# Patient Record
Sex: Female | Born: 1980
Health system: Southern US, Community
[De-identification: ages and names within clinical notes are randomized; demographics above are authoritative.]

## PROBLEM LIST (undated history)

## (undated) DIAGNOSIS — N946 Dysmenorrhea, unspecified: Secondary | ICD-10-CM

## (undated) DIAGNOSIS — I739 Peripheral vascular disease, unspecified: Secondary | ICD-10-CM

## (undated) DIAGNOSIS — I10 Essential (primary) hypertension: Secondary | ICD-10-CM

## (undated) DIAGNOSIS — J45909 Unspecified asthma, uncomplicated: Secondary | ICD-10-CM

## (undated) HISTORY — PX: CHOLECYSTECTOMY: SHX55

## (undated) HISTORY — PX: TUBAL LIGATION: SHX77

---

## 2016-12-28 ENCOUNTER — Emergency Department (HOSPITAL_BASED_OUTPATIENT_CLINIC_OR_DEPARTMENT_OTHER)
Admission: EM | Admit: 2016-12-28 | Discharge: 2016-12-28 | Disposition: A | Discharge status: Home / Self Care | Payer: Medicaid Other | Attending: Dermatology | Admitting: Dermatology

## 2016-12-28 ENCOUNTER — Encounter (HOSPITAL_BASED_OUTPATIENT_CLINIC_OR_DEPARTMENT_OTHER): Payer: Self-pay | Admitting: *Deleted

## 2016-12-28 DIAGNOSIS — Z5321 Procedure and treatment not carried out due to patient leaving prior to being seen by health care provider: Secondary | ICD-10-CM | POA: Diagnosis not present

## 2016-12-28 DIAGNOSIS — F17290 Nicotine dependence, other tobacco product, uncomplicated: Secondary | ICD-10-CM | POA: Insufficient documentation

## 2016-12-28 DIAGNOSIS — R102 Pelvic and perineal pain: Secondary | ICD-10-CM | POA: Insufficient documentation

## 2016-12-28 NOTE — ED Notes (Signed)
Called once in triage with no answer.

## 2016-12-28 NOTE — ED Notes (Signed)
Called for reevaluation, no response 

## 2016-12-28 NOTE — ED Triage Notes (Signed)
Pt c/o pelvic pain x 3 days

## 2017-08-16 DIAGNOSIS — M722 Plantar fascial fibromatosis: Secondary | ICD-10-CM | POA: Diagnosis not present

## 2018-01-27 ENCOUNTER — Emergency Department (HOSPITAL_BASED_OUTPATIENT_CLINIC_OR_DEPARTMENT_OTHER): Payer: Self-pay

## 2018-01-27 ENCOUNTER — Other Ambulatory Visit: Payer: Self-pay

## 2018-01-27 ENCOUNTER — Emergency Department (HOSPITAL_BASED_OUTPATIENT_CLINIC_OR_DEPARTMENT_OTHER)
Admission: EM | Admit: 2018-01-27 | Discharge: 2018-01-27 | Disposition: A | Discharge status: Home / Self Care | Payer: Self-pay | Attending: Emergency Medicine | Admitting: Emergency Medicine

## 2018-01-27 ENCOUNTER — Encounter (HOSPITAL_BASED_OUTPATIENT_CLINIC_OR_DEPARTMENT_OTHER): Payer: Self-pay | Admitting: Emergency Medicine

## 2018-01-27 DIAGNOSIS — I10 Essential (primary) hypertension: Secondary | ICD-10-CM | POA: Insufficient documentation

## 2018-01-27 DIAGNOSIS — M778 Other enthesopathies, not elsewhere classified: Secondary | ICD-10-CM

## 2018-01-27 DIAGNOSIS — Y939 Activity, unspecified: Secondary | ICD-10-CM | POA: Insufficient documentation

## 2018-01-27 DIAGNOSIS — M70842 Other soft tissue disorders related to use, overuse and pressure, left hand: Secondary | ICD-10-CM | POA: Insufficient documentation

## 2018-01-27 DIAGNOSIS — F17290 Nicotine dependence, other tobacco product, uncomplicated: Secondary | ICD-10-CM | POA: Insufficient documentation

## 2018-01-27 HISTORY — DX: Essential (primary) hypertension: I10

## 2018-01-27 MED ORDER — AMLODIPINE BESYLATE 5 MG PO TABS
5.0000 mg | ORAL_TABLET | Freq: Once | ORAL | Status: AC
  Administered 2018-01-27: 5 mg via ORAL
  Filled 2018-01-27: qty 1

## 2018-01-27 MED ORDER — IBUPROFEN 800 MG PO TABS
800.0000 mg | ORAL_TABLET | Freq: Once | ORAL | Status: AC
  Administered 2018-01-27: 800 mg via ORAL
  Filled 2018-01-27: qty 1

## 2018-01-27 MED ORDER — AMLODIPINE BESYLATE 5 MG PO TABS: 5.0000 mg | ORAL_TABLET | Freq: Every day | ORAL | 1 refills | Status: DC

## 2018-01-27 NOTE — ED Triage Notes (Signed)
Pt c/o left wrist pain x 2 days without injury. Pt has noted knot on left wrist.

## 2018-01-27 NOTE — Discharge Instructions (Signed)
You may alternate Tylenol 1000 mg every 6 hours as needed for pain and Ibuprofen 800 mg every 8 hours as needed for pain.  Please take Ibuprofen with food.  Both of these medications are found over-the-counter. ° ° °To find a primary care or specialty doctor please call [phone] or [phone] to access "[facility] Find a Doctor Service." ° °You may also go on the [place] [facility] website at [email or web address] ° °There are also multiple [facility], [facility], [facility] and [facility] practices throughout the [place] that are frequently accepting new patients. You may find a clinic that is close to your home and contact them. ° °[facility] -  °[address] °[place] [address] °[phone] ° ° °[facility] -  °[address] °[place] [address] [phone] ° ° °[facility] - °[address]  °[place] [address] °[phone] ° ° ° °

## 2018-01-27 NOTE — ED Triage Notes (Signed)
Pt is hypertensive upon triage. Pt is supposed to be on BP medication but has ran out and does not have PMD to follow up with to prescribe more. Pt educated on dangers of non-compliance with BP medication.

## 2018-01-27 NOTE — ED Provider Notes (Signed)
TIME SEEN: 3:12 AM  CHIEF COMPLAINT: Left wrist pain  HPI: Patient is a 37 year old female who is right-hand dominant with history of hypertension who presents to the emergency department with left wrist pain for the past year.  No injury to the wrist that she can recall.  Pain is worse with flexion and extension.  Has not tried any medications prior to arrival.  No redness, warmth, swelling or ecchymosis.  Also noted to be very hypertensive here.  States she was previously on blood pressure medication but she cannot recall the name.  Has been off for over a year.  Does not have a primary care doctor.  No headaches, vision changes, chest pain, shortness of breath, numbness, tingling or focal weakness currently.  ROS: See HPI Constitutional: no fever  Eyes: no drainage  ENT: no runny nose   Cardiovascular:  no chest pain  Resp: no SOB  GI: no vomiting GU: no dysuria Integumentary: no rash  Allergy: no hives  Musculoskeletal: no leg swelling  Neurological: no slurred speech ROS otherwise negative  PAST MEDICAL HISTORY/PAST SURGICAL HISTORY:  Past Medical History:  Diagnosis Date  . Hypertension     MEDICATIONS:  Prior to Admission medications   Not on File    ALLERGIES:  No Known Allergies  SOCIAL HISTORY:  Social History   Tobacco Use  . Smoking status: Current Every Day Smoker    Types: Cigars  Substance Use Topics  . Alcohol use: No    FAMILY HISTORY: No family history on file.  EXAM: BP (!) 197/125 (BP Location: Right Arm)   Pulse 97   Temp 98.8 F (37.1 C) (Oral)   Resp 16   Ht 5' (1.524 m)   Wt 81.6 kg (180 lb)   LMP  (LMP Unknown)   SpO2 100%   BMI 35.15 kg/m  CONSTITUTIONAL: Alert and oriented and responds appropriately to questions. Well-appearing; well-nourished HEAD: Normocephalic EYES: Conjunctivae clear, pupils appear equal, EOMI ENT: normal nose; moist mucous membranes NECK: Supple, no meningismus, no nuchal rigidity, no LAD  CARD: RRR; S1  and S2 appreciated; no murmurs, no clicks, no rubs, no gallops RESP: Normal chest excursion without splinting or tachypnea; breath sounds clear and equal bilaterally; no wheezes, no rhonchi, no rales, no hypoxia or respiratory distress, speaking full sentences ABD/GI: Normal bowel sounds; non-distended; soft, non-tender, no rebound, no guarding, no peritoneal signs, no hepatosplenomegaly BACK:  The back appears normal and is non-tender to palpation, there is no CVA tenderness EXT: Tender to palpation over the dorsal left wrist with decreased flexion and extension secondary to pain and poor compliance with examination.  She has 2+ radial pulses bilaterally.  No redness, warmth, ecchymosis or swelling noted.  She has a small nodule over the dorsal left wrist which could be a small ganglion cyst but no fluctuance or induration.  Otherwise normal ROM in all joints; otherwise extremities are non-tender to palpation; no edema; normal capillary refill; no cyanosis, no calf tenderness or swelling    SKIN: Normal color for age and race; warm; no rash NEURO: Moves all extremities equally normal sensation diffusely, cranial nerves II through XII intact, normal speech, normal gait PSYCH: The patient's mood and manner are appropriate. Grooming and personal hygiene are appropriate.  MEDICAL DECISION MAKING: Patient here with what seems to be left wrist tendinitis.  Have recommended alternating Tylenol and Motrin for pain, rest, elevation and ice.  We will give her a Velcro wrist splint for comfort.  No sign of  compartment syndrome, gout, septic arthritis.  X-ray obtained in triage is unremarkable.  Patient also hypertensive.  She is asymptomatic.  I will start her on amlodipine daily and give her outpatient PCP follow-up.  At this time I do not feel she needs further emergent workup for hypertension given she is asymptomatic.  We discussed at length return precautions and importance of compliance with blood pressure  medication and outpatient follow-up.  At this time, I do not feel there is any life-threatening condition present. I have reviewed and discussed all results (EKG, imaging, lab, urine as appropriate) and exam findings with patient/family. I have reviewed nursing notes and appropriate previous records.  I feel the patient is safe to be discharged home without further emergent workup and can continue workup as an outpatient as needed. Discussed usual and customary return precautions. Patient/family verbalize understanding and are comfortable with this plan.  Outpatient follow-up has been provided if needed. All questions have been answered.      Verlinda Slotnick, Layla MawKristen N, DO 01/27/18 [phone]

## 2018-06-10 DIAGNOSIS — M79672 Pain in left foot: Secondary | ICD-10-CM | POA: Insufficient documentation

## 2018-06-10 DIAGNOSIS — M25579 Pain in unspecified ankle and joints of unspecified foot: Secondary | ICD-10-CM

## 2018-06-10 DIAGNOSIS — M79673 Pain in unspecified foot: Secondary | ICD-10-CM | POA: Insufficient documentation

## 2018-06-10 DIAGNOSIS — M25572 Pain in left ankle and joints of left foot: Secondary | ICD-10-CM | POA: Insufficient documentation

## 2018-06-10 HISTORY — DX: Pain in unspecified ankle and joints of unspecified foot: M25.579

## 2018-06-10 HISTORY — DX: Pain in unspecified foot: M79.673

## 2018-08-03 DIAGNOSIS — I10 Essential (primary) hypertension: Secondary | ICD-10-CM | POA: Diagnosis not present

## 2018-08-03 DIAGNOSIS — Z79899 Other long term (current) drug therapy: Secondary | ICD-10-CM | POA: Diagnosis not present

## 2018-08-03 DIAGNOSIS — Z0130 Encounter for examination of blood pressure without abnormal findings: Secondary | ICD-10-CM | POA: Diagnosis not present

## 2018-08-16 DIAGNOSIS — I10 Essential (primary) hypertension: Secondary | ICD-10-CM | POA: Diagnosis not present

## 2018-09-06 DIAGNOSIS — H524 Presbyopia: Secondary | ICD-10-CM | POA: Diagnosis not present

## 2018-09-07 DIAGNOSIS — H5213 Myopia, bilateral: Secondary | ICD-10-CM | POA: Diagnosis not present

## 2018-09-19 DIAGNOSIS — H524 Presbyopia: Secondary | ICD-10-CM | POA: Diagnosis not present

## 2018-12-12 DIAGNOSIS — R079 Chest pain, unspecified: Secondary | ICD-10-CM | POA: Diagnosis not present

## 2018-12-12 DIAGNOSIS — M7989 Other specified soft tissue disorders: Secondary | ICD-10-CM | POA: Diagnosis not present

## 2018-12-12 DIAGNOSIS — M25571 Pain in right ankle and joints of right foot: Secondary | ICD-10-CM | POA: Diagnosis not present

## 2018-12-12 DIAGNOSIS — Z87891 Personal history of nicotine dependence: Secondary | ICD-10-CM | POA: Diagnosis not present

## 2018-12-12 DIAGNOSIS — R05 Cough: Secondary | ICD-10-CM | POA: Diagnosis not present

## 2018-12-12 DIAGNOSIS — R0789 Other chest pain: Secondary | ICD-10-CM | POA: Diagnosis not present

## 2018-12-12 DIAGNOSIS — Z3202 Encounter for pregnancy test, result negative: Secondary | ICD-10-CM | POA: Diagnosis not present

## 2018-12-12 DIAGNOSIS — R0602 Shortness of breath: Secondary | ICD-10-CM | POA: Diagnosis not present

## 2018-12-12 DIAGNOSIS — J40 Bronchitis, not specified as acute or chronic: Secondary | ICD-10-CM | POA: Diagnosis not present

## 2018-12-12 DIAGNOSIS — R911 Solitary pulmonary nodule: Secondary | ICD-10-CM | POA: Diagnosis not present

## 2018-12-13 DIAGNOSIS — R079 Chest pain, unspecified: Secondary | ICD-10-CM | POA: Diagnosis not present

## 2019-05-12 ENCOUNTER — Emergency Department (HOSPITAL_BASED_OUTPATIENT_CLINIC_OR_DEPARTMENT_OTHER)
Admission: EM | Admit: 2019-05-12 | Discharge: 2019-05-13 | Disposition: A | Discharge status: Home / Self Care | Payer: Medicaid Other | Attending: Emergency Medicine | Admitting: Emergency Medicine

## 2019-05-12 ENCOUNTER — Encounter (HOSPITAL_BASED_OUTPATIENT_CLINIC_OR_DEPARTMENT_OTHER): Payer: Self-pay

## 2019-05-12 ENCOUNTER — Other Ambulatory Visit: Payer: Self-pay

## 2019-05-12 ENCOUNTER — Emergency Department (HOSPITAL_BASED_OUTPATIENT_CLINIC_OR_DEPARTMENT_OTHER): Payer: Medicaid Other

## 2019-05-12 DIAGNOSIS — F17200 Nicotine dependence, unspecified, uncomplicated: Secondary | ICD-10-CM | POA: Insufficient documentation

## 2019-05-12 DIAGNOSIS — I10 Essential (primary) hypertension: Secondary | ICD-10-CM | POA: Diagnosis not present

## 2019-05-12 DIAGNOSIS — R1031 Right lower quadrant pain: Secondary | ICD-10-CM | POA: Diagnosis not present

## 2019-05-12 DIAGNOSIS — R1110 Vomiting, unspecified: Secondary | ICD-10-CM | POA: Diagnosis not present

## 2019-05-12 LAB — CBC WITH DIFFERENTIAL/PLATELET
Abs Immature Granulocytes: 0.03 10*3/uL (ref 0.00–0.07)
Basophils Absolute: 0 10*3/uL (ref 0.0–0.1)
Basophils Relative: 0 %
Eosinophils Absolute: 0 10*3/uL (ref 0.0–0.5)
Eosinophils Relative: 0 %
HCT: 42.6 % (ref 36.0–46.0)
Hemoglobin: 14.5 g/dL (ref 12.0–15.0)
Immature Granulocytes: 0 %
Lymphocytes Relative: 28 %
Lymphs Abs: 2.1 10*3/uL (ref 0.7–4.0)
MCH: 32.9 pg (ref 26.0–34.0)
MCHC: 34 g/dL (ref 30.0–36.0)
MCV: 96.6 fL (ref 80.0–100.0)
Monocytes Absolute: 0.5 10*3/uL (ref 0.1–1.0)
Monocytes Relative: 7 %
Neutro Abs: 4.7 10*3/uL (ref 1.7–7.7)
Neutrophils Relative %: 65 %
Platelets: 255 10*3/uL (ref 150–400)
RBC: 4.41 MIL/uL (ref 3.87–5.11)
RDW: 13.1 % (ref 11.5–15.5)
WBC: 7.4 10*3/uL (ref 4.0–10.5)
nRBC: 0 % (ref 0.0–0.2)

## 2019-05-12 LAB — COMPREHENSIVE METABOLIC PANEL
ALT: 15 U/L (ref 0–44)
AST: 17 U/L (ref 15–41)
Albumin: 3.6 g/dL (ref 3.5–5.0)
Alkaline Phosphatase: 91 U/L (ref 38–126)
Anion gap: 9 (ref 5–15)
BUN: 7 mg/dL (ref 6–20)
CO2: 24 mmol/L (ref 22–32)
Calcium: 8.9 mg/dL (ref 8.9–10.3)
Chloride: 104 mmol/L (ref 98–111)
Creatinine, Ser: 0.68 mg/dL (ref 0.44–1.00)
GFR calc Af Amer: >60 mL/min (ref >60)
GFR calc non Af Amer: >60 mL/min (ref >60)
Glucose, Bld: 116 mg/dL — ABNORMAL HIGH (ref 70–99)
Potassium: 3.8 mmol/L (ref 3.5–5.1)
Sodium: 137 mmol/L (ref 135–145)
Total Bilirubin: 0.4 mg/dL (ref 0.3–1.2)
Total Protein: 7.6 g/dL (ref 6.5–8.1)

## 2019-05-12 LAB — URINALYSIS, ROUTINE W REFLEX MICROSCOPIC
Bilirubin Urine: NEGATIVE
Glucose, UA: NEGATIVE mg/dL
Hgb urine dipstick: NEGATIVE
Ketones, ur: NEGATIVE mg/dL
Leukocytes,Ua: NEGATIVE
Nitrite: NEGATIVE
Protein, ur: NEGATIVE mg/dL
Specific Gravity, Urine: <1.005 — ABNORMAL LOW (ref 1.005–1.030)
pH: 7 (ref 5.0–8.0)

## 2019-05-12 LAB — PREGNANCY, URINE: Preg Test, Ur: NEGATIVE

## 2019-05-12 MED ORDER — SODIUM CHLORIDE 0.9 % IV BOLUS
500.0000 mL | Freq: Once | INTRAVENOUS | Status: AC
  Administered 2019-05-12: 500 mL via INTRAVENOUS

## 2019-05-12 MED ORDER — FENTANYL CITRATE (PF) 100 MCG/2ML IJ SOLN
50.0000 ug | Freq: Once | INTRAMUSCULAR | Status: AC
  Administered 2019-05-12: 50 ug via INTRAVENOUS
  Filled 2019-05-12: qty 2

## 2019-05-12 MED ORDER — IOHEXOL 300 MG/ML  SOLN
100.0000 mL | Freq: Once | INTRAMUSCULAR | Status: AC | PRN
  Administered 2019-05-12: 100 mL via INTRAVENOUS

## 2019-05-12 NOTE — Discharge Instructions (Signed)
Follow-up with your doctor as needed.  Return for worsening pain.

## 2019-05-12 NOTE — ED Triage Notes (Signed)
C/o RLQ pain, vomited x 1, diarrhea x 2 days-NAD-steady gait

## 2019-05-12 NOTE — ED Notes (Signed)
ED Provider at bedside. 

## 2019-05-12 NOTE — ED Provider Notes (Addendum)
[facility] Provider Note   CSN: [id number] Arrival date & time: 05/12/19  2055    History   Chief Complaint Chief Complaint  Patient presents with   Abdominal Pain    HPI Janice French is a 38 y.o. female.     HPI Patient presents with right lower abdominal pain.  Vomited once and had some stool that she states looks like melted chocolate.  States she got sweaty.  Pain may be worse with eating.  States she had some chills but not a frank fever.  No vaginal bleeding or discharge. Past Medical History:  Diagnosis Date   Hypertension     There are no active problems to display for this patient.   Past Surgical History:  Procedure Laterality Date   CHOLECYSTECTOMY     TUBAL LIGATION       OB History   No obstetric history on file.      Home Medications    Prior to Admission medications   Medication Sig Start Date End Date Taking? Authorizing Provider  amLODipine (NORVASC) 5 MG tablet Take 1 tablet (5 mg total) by mouth daily. 01/27/18   Ward, Delice Bison, DO  dicyclomine (BENTYL) 20 MG tablet Take 1 tablet (20 mg total) by mouth 3 (three) times daily as needed for spasms. 05/12/19   Davonna Belling, MD    Family History No family history on file.  Social History Social History   Tobacco Use   Smoking status: Current Every Day Smoker    Types: Cigars   Smokeless tobacco: Never Used  Substance Use Topics   Alcohol use: No   Drug use: No     Allergies   Patient has no known allergies.   Review of Systems Review of Systems  Constitutional: Positive for appetite change.  HENT: Negative for congestion.   Respiratory: Negative for shortness of breath.   Gastrointestinal: Positive for abdominal pain and nausea.  Genitourinary: Negative for flank pain.  Musculoskeletal: Negative for back pain.  Skin: Negative for rash.  Neurological: Negative for weakness.  Psychiatric/Behavioral: Negative for confusion.      Physical Exam Updated Vital Signs BP (!) 196/99    Pulse 89    Temp 99 F (37.2 C) (Oral)    Resp 20    Ht 5\' 3"  (1.6 m)    Wt 87.5 kg    LMP 05/03/2019    SpO2 99%    BMI 34.19 kg/m   Physical Exam Vitals signs and nursing note reviewed.  HENT:     Head: Normocephalic.  Cardiovascular:     Rate and Rhythm: Regular rhythm.  Pulmonary:     Breath sounds: Normal breath sounds.  Abdominal:     Hernia: No hernia is present.     Comments: Moderate lower to right lower quadrant tenderness.  No hernia.  No rebound or guarding.  Skin:    General: Skin is warm.  Neurological:     Mental Status: She is alert.      ED Treatments / Results  Labs (all labs ordered are listed, but only abnormal results are displayed) Labs Reviewed  COMPREHENSIVE METABOLIC PANEL - Abnormal; Notable for the following components:      Result Value   Glucose, Bld 116 (*)    All other components within normal limits  URINALYSIS, ROUTINE W REFLEX MICROSCOPIC - Abnormal; Notable for the following components:   Color, Urine STRAW (*)    Specific Gravity, Urine <1.005 (*)  All other components within normal limits  CBC WITH DIFFERENTIAL/PLATELET  PREGNANCY, URINE    EKG None  Radiology Ct Abdomen Pelvis W Contrast  Result Date: 05/12/2019 CLINICAL DATA:  Right lower quadrant pain for 2 days. Vomiting today. EXAM: CT ABDOMEN AND PELVIS WITH CONTRAST TECHNIQUE: Multidetector CT imaging of the abdomen and pelvis was performed using the standard protocol following bolus administration of intravenous contrast. CONTRAST:  100 mL OMNIPAQUE IOHEXOL 300 MG/ML  SOLN COMPARISON:  CT abdomen and pelvis 12/17/2016. FINDINGS: Lower chest: Lung bases clear.  No pleural or pericardial effusion. Hepatobiliary: No focal liver abnormality is seen. Status post cholecystectomy. No biliary dilatation. Pancreas: Unremarkable. No pancreatic ductal dilatation or surrounding inflammatory changes. Spleen: Normal in size  without focal abnormality. Adrenals/Urinary Tract: Adrenal glands are unremarkable. Kidneys are normal, without renal calculi, focal lesion, or hydronephrosis. Bladder is unremarkable. Stomach/Bowel: Stomach is within normal limits. Appendix appears normal. No evidence of bowel wall thickening, distention, or inflammatory changes. Vascular/Lymphatic: Aortic atherosclerosis. No enlarged abdominal or pelvic lymph nodes. Reproductive: Uterus and bilateral adnexa are unremarkable. Other: Small fat containing umbilical and supraumbilical hernias are unchanged. Musculoskeletal: No acute or focal abnormality. IMPRESSION: No acute abnormality abdomen or pelvis. Atherosclerosis. Small fat containing umbilical and supraumbilical hernias, unchanged. Electronically Signed   By: Drusilla Kannerhomas  Dalessio M.D.   On: 05/12/2019 23:43    Procedures Procedures (including critical care time)  Medications Ordered in ED Medications  sodium chloride 0.9 % bolus 500 mL (500 mLs Intravenous New Bag/Given 05/12/19 2239)  fentaNYL (SUBLIMAZE) injection 50 mcg (50 mcg Intravenous Given 05/12/19 2301)  iohexol (OMNIPAQUE) 300 MG/ML solution 100 mL (100 mLs Intravenous Contrast Given 05/12/19 2322)     Initial Impression / Assessment and Plan / ED Course  I have reviewed the triage vital signs and the nursing notes.  Pertinent labs & imaging results that were available during my care of the patient were reviewed by me and considered in my medical decision making (see chart for details).         Patient with lower abdominal pain.  Moderate tenderness.  Not pregnant.  Ct negative labs reasuring. D/c home  Final Clinical Impressions(s) / ED Diagnoses   Final diagnoses:  Right lower quadrant abdominal pain    ED Discharge Orders         Ordered    dicyclomine (BENTYL) 20 MG tablet  3 times daily PRN     05/13/19 0000           Benjiman CorePickering, Liz Pinho, MD 05/12/19 2344    Benjiman CorePickering, Rudi Bunyard, MD 05/13/19 0000

## 2019-05-12 NOTE — ED Notes (Signed)
CT awaiting results from [facility] and CMP prior to imaging per radiology protocol

## 2019-05-13 MED ORDER — DICYCLOMINE HCL 20 MG PO TABS: 20.0000 mg | ORAL_TABLET | Freq: Three times a day (TID) | ORAL | 0 refills | Status: DC | PRN

## 2019-06-08 DIAGNOSIS — Z1151 Encounter for screening for human papillomavirus (HPV): Secondary | ICD-10-CM | POA: Diagnosis not present

## 2019-06-08 DIAGNOSIS — N852 Hypertrophy of uterus: Secondary | ICD-10-CM | POA: Diagnosis not present

## 2019-06-08 DIAGNOSIS — Z0000 Encounter for general adult medical examination without abnormal findings: Secondary | ICD-10-CM | POA: Diagnosis not present

## 2019-06-08 DIAGNOSIS — Z113 Encounter for screening for infections with a predominantly sexual mode of transmission: Secondary | ICD-10-CM | POA: Diagnosis not present

## 2019-06-08 DIAGNOSIS — Z124 Encounter for screening for malignant neoplasm of cervix: Secondary | ICD-10-CM | POA: Diagnosis not present

## 2019-06-08 DIAGNOSIS — Z01419 Encounter for gynecological examination (general) (routine) without abnormal findings: Secondary | ICD-10-CM | POA: Diagnosis not present

## 2019-06-23 ENCOUNTER — Emergency Department (HOSPITAL_BASED_OUTPATIENT_CLINIC_OR_DEPARTMENT_OTHER)
Admission: EM | Admit: 2019-06-23 | Discharge: 2019-06-23 | Disposition: A | Discharge status: Home / Self Care | Payer: Medicaid Other | Attending: Emergency Medicine | Admitting: Emergency Medicine

## 2019-06-23 ENCOUNTER — Encounter (HOSPITAL_BASED_OUTPATIENT_CLINIC_OR_DEPARTMENT_OTHER): Payer: Self-pay | Admitting: Emergency Medicine

## 2019-06-23 ENCOUNTER — Other Ambulatory Visit: Payer: Self-pay

## 2019-06-23 DIAGNOSIS — Z79899 Other long term (current) drug therapy: Secondary | ICD-10-CM | POA: Insufficient documentation

## 2019-06-23 DIAGNOSIS — R1084 Generalized abdominal pain: Secondary | ICD-10-CM | POA: Insufficient documentation

## 2019-06-23 DIAGNOSIS — I10 Essential (primary) hypertension: Secondary | ICD-10-CM | POA: Diagnosis not present

## 2019-06-23 DIAGNOSIS — R102 Pelvic and perineal pain: Secondary | ICD-10-CM | POA: Insufficient documentation

## 2019-06-23 DIAGNOSIS — R112 Nausea with vomiting, unspecified: Secondary | ICD-10-CM | POA: Diagnosis present

## 2019-06-23 DIAGNOSIS — R1110 Vomiting, unspecified: Secondary | ICD-10-CM | POA: Diagnosis not present

## 2019-06-23 HISTORY — DX: Dysmenorrhea, unspecified: N94.6

## 2019-06-23 LAB — URINALYSIS, ROUTINE W REFLEX MICROSCOPIC
Bilirubin Urine: NEGATIVE
Glucose, UA: NEGATIVE mg/dL
Hgb urine dipstick: NEGATIVE
Ketones, ur: NEGATIVE mg/dL
Leukocytes,Ua: NEGATIVE
Nitrite: NEGATIVE
Protein, ur: NEGATIVE mg/dL
Specific Gravity, Urine: >1.03 — ABNORMAL HIGH (ref 1.005–1.030)
pH: 6 (ref 5.0–8.0)

## 2019-06-23 LAB — RAPID URINE DRUG SCREEN, HOSP PERFORMED
Amphetamines: NOT DETECTED
Barbiturates: NOT DETECTED
Benzodiazepines: NOT DETECTED
Cocaine: NOT DETECTED
Opiates: NOT DETECTED
Tetrahydrocannabinol: POSITIVE — AB

## 2019-06-23 LAB — HCG, SERUM, QUALITATIVE: Preg, Serum: NEGATIVE

## 2019-06-23 LAB — CBC WITH DIFFERENTIAL/PLATELET
Abs Immature Granulocytes: 0.04 10*3/uL (ref 0.00–0.07)
Basophils Absolute: 0 10*3/uL (ref 0.0–0.1)
Basophils Relative: 1 %
Eosinophils Absolute: 0 10*3/uL (ref 0.0–0.5)
Eosinophils Relative: 0 %
HCT: 43.6 % (ref 36.0–46.0)
Hemoglobin: 14.6 g/dL (ref 12.0–15.0)
Immature Granulocytes: 1 %
Lymphocytes Relative: 20 %
Lymphs Abs: 1.6 10*3/uL (ref 0.7–4.0)
MCH: 31.9 pg (ref 26.0–34.0)
MCHC: 33.5 g/dL (ref 30.0–36.0)
MCV: 95.4 fL (ref 80.0–100.0)
Monocytes Absolute: 0.4 10*3/uL (ref 0.1–1.0)
Monocytes Relative: 5 %
Neutro Abs: 6.2 10*3/uL (ref 1.7–7.7)
Neutrophils Relative %: 73 %
Platelets: 302 10*3/uL (ref 150–400)
RBC: 4.57 MIL/uL (ref 3.87–5.11)
RDW: 13.3 % (ref 11.5–15.5)
WBC: 8.3 10*3/uL (ref 4.0–10.5)
nRBC: 0 % (ref 0.0–0.2)

## 2019-06-23 LAB — COMPREHENSIVE METABOLIC PANEL
ALT: 17 U/L (ref 0–44)
AST: 19 U/L (ref 15–41)
Albumin: 3.9 g/dL (ref 3.5–5.0)
Alkaline Phosphatase: 80 U/L (ref 38–126)
Anion gap: 10 (ref 5–15)
BUN: 9 mg/dL (ref 6–20)
CO2: 23 mmol/L (ref 22–32)
Calcium: 9.1 mg/dL (ref 8.9–10.3)
Chloride: 104 mmol/L (ref 98–111)
Creatinine, Ser: 0.69 mg/dL (ref 0.44–1.00)
GFR calc Af Amer: >60 mL/min (ref >60)
GFR calc non Af Amer: >60 mL/min (ref >60)
Glucose, Bld: 127 mg/dL — ABNORMAL HIGH (ref 70–99)
Potassium: 3.9 mmol/L (ref 3.5–5.1)
Sodium: 137 mmol/L (ref 135–145)
Total Bilirubin: 0.4 mg/dL (ref 0.3–1.2)
Total Protein: 7.6 g/dL (ref 6.5–8.1)

## 2019-06-23 MED ORDER — METOCLOPRAMIDE HCL 5 MG/ML IJ SOLN
10.0000 mg | Freq: Once | INTRAMUSCULAR | Status: AC
  Administered 2019-06-23: 10 mg via INTRAVENOUS
  Filled 2019-06-23: qty 2

## 2019-06-23 MED ORDER — SODIUM CHLORIDE 0.9 % IV BOLUS (SEPSIS)
1000.0000 mL | Freq: Once | INTRAVENOUS | Status: AC
  Administered 2019-06-23: 1000 mL via INTRAVENOUS

## 2019-06-23 MED ORDER — PROMETHAZINE HCL 25 MG PO TABS: 25.0000 mg | ORAL_TABLET | Freq: Four times a day (QID) | ORAL | 0 refills | Status: DC | PRN

## 2019-06-23 MED ORDER — KETOROLAC TROMETHAMINE 30 MG/ML IJ SOLN
30.0000 mg | Freq: Once | INTRAMUSCULAR | Status: AC
  Administered 2019-06-23: 30 mg via INTRAVENOUS
  Filled 2019-06-23: qty 1

## 2019-06-23 MED ORDER — SODIUM CHLORIDE 0.9 % IV SOLN
1000.0000 mL | INTRAVENOUS | Status: DC
  Administered 2019-06-23: 1000 mL via INTRAVENOUS

## 2019-06-23 MED ORDER — ONDANSETRON 4 MG PO TBDP: 4.0000 mg | ORAL_TABLET | ORAL | 0 refills | Status: DC | PRN

## 2019-06-23 MED ORDER — DIPHENHYDRAMINE HCL 50 MG/ML IJ SOLN
25.0000 mg | Freq: Once | INTRAMUSCULAR | Status: AC
  Administered 2019-06-23: 25 mg via INTRAVENOUS
  Filled 2019-06-23: qty 1

## 2019-06-23 MED ORDER — ZIKS ARTHRITIS PAIN RELIEF 0.025-1-12 % EX CREA: 1.0000 "application " | TOPICAL_CREAM | Freq: Four times a day (QID) | CUTANEOUS | 1 refills | Status: AC | PRN

## 2019-06-23 MED FILL — ONDANSETRON ODT 4 MG TABLET: 4 | 3 days supply | Qty: 20 | Fill #0

## 2019-06-23 MED FILL — PROMETHAZINE 25 MG TABLET: 25 | 5 days supply | Qty: 20 | Fill #0

## 2019-06-23 NOTE — Discharge Instructions (Signed)
1.  Follow-up with your gynecologist in 1 to 2 weeks. 2.  Take Zofran for nausea every 4-6 hours if needed.  You may also take Phenergan but it can make you more drowsy.  Try applying capsaicin cream to your abdomen if you are having nausea and vomiting.  It may be helpful. 3.  Review information on cannabinoids hyperemesis syndrome. 4.  Return to the emergency department if symptoms worsen or change

## 2019-06-23 NOTE — ED Notes (Signed)
Discharged by float nurse

## 2019-06-23 NOTE — ED Provider Notes (Signed)
[facility] Provider Note   CSN: [id number] Arrival date & time: 06/23/19  1109     History   Chief Complaint Chief Complaint  Patient presents with  . Pelvic Pain  . Emesis    HPI Antony BlackbirdCynthia Madril is a 38 y.o. female.     HPI The patient reports she gets recurrent nausea and vomiting most cycles.  She reports this is been happening for a long time.  She also gets diffuse cramping abdominal pain.  No fevers, no chills, no pain burning urgency with urination.  Patient reports she has not been sexually active for quite some time and does not suspect STD.  She denies having nausea medications to take at home. Past Medical History:  Diagnosis Date  . Dysmenorrhea   . Hypertension     There are no active problems to display for this patient.   Past Surgical History:  Procedure Laterality Date  . CHOLECYSTECTOMY    . TUBAL LIGATION       OB History   No obstetric history on file.      Home Medications    Prior to Admission medications   Medication Sig Start Date End Date Taking? Authorizing Provider  amLODipine (NORVASC) 5 MG tablet Take 1 tablet (5 mg total) by mouth daily. 01/27/18   Ward, Layla MawKristen N, DO    Family History No family history on file.  Social History Social History   Tobacco Use  . Smoking status: Current Every Day Smoker    Types: Cigars  . Smokeless tobacco: Never Used  Substance Use Topics  . Alcohol use: No  . Drug use: No     Allergies   Patient has no known allergies.   Review of Systems Review of Systems 10 Systems reviewed and are negative for acute change except as noted in the HPI.   Physical Exam Updated Vital Signs Pulse 82   Temp 98.2 F (36.8 C)   Resp 16   Ht 5' (1.524 m)   Wt 85.3 kg   LMP 06/23/2019   BMI 36.72 kg/m   Physical Exam Constitutional:      Appearance: She is well-developed.  HENT:     Head: Normocephalic and atraumatic.  Eyes:     Extraocular Movements:  Extraocular movements intact.     Conjunctiva/sclera: Conjunctivae normal.  Neck:     Musculoskeletal: Neck supple.  Cardiovascular:     Rate and Rhythm: Normal rate and regular rhythm.     Heart sounds: Normal heart sounds.  Pulmonary:     Effort: Pulmonary effort is normal.     Breath sounds: Normal breath sounds.  Abdominal:     General: Bowel sounds are normal. There is no distension.     Palpations: Abdomen is soft.     Tenderness: There is no abdominal tenderness.  Musculoskeletal: Normal range of motion.  Skin:    General: Skin is warm and dry.  Neurological:     Mental Status: She is alert and oriented to person, place, and time.     GCS: GCS eye subscore is 4. GCS verbal subscore is 5. GCS motor subscore is 6.     Coordination: Coordination normal.      ED Treatments / Results  Labs (all labs ordered are listed, but only abnormal results are displayed) Labs Reviewed - No data to display  EKG None  Radiology No results found.  Procedures Procedures (including critical care time)  Medications Ordered in ED Medications -  No data to display   Initial Impression / Assessment and Plan / ED Course  I have reviewed the triage vital signs and the nursing notes.  Pertinent labs & imaging results that were available during my care of the patient were reviewed by me and considered in my medical decision making (see chart for details).       Patient is much improved after hydration with fluids, Reglan, Benadryl and Toradol.  She reports she has similar symptoms recurrently.  This happens with most menstrual cycles.  Patient also does smoke some marijuana.  Differential also includes cannabinoid hyperemesis syndrome.  Patient is counseled on use of medications at home for treating symptoms.  Return precautions reviewed.  Final Clinical Impressions(s) / ED Diagnoses   Final diagnoses:  Hyperemesis  Generalized abdominal pain    ED Discharge Orders    None        Charlesetta Shanks, MD 06/23/19 1528

## 2019-06-23 NOTE — ED Triage Notes (Signed)
Pt started having menstrual cramping radiating to back with nausea/emesis this am.  Pt states she has had in the past.  No heavy bleeding. No dysuria or fever.

## 2019-11-22 ENCOUNTER — Other Ambulatory Visit: Payer: Self-pay

## 2019-11-22 ENCOUNTER — Encounter (HOSPITAL_BASED_OUTPATIENT_CLINIC_OR_DEPARTMENT_OTHER): Payer: Self-pay | Admitting: *Deleted

## 2019-11-22 DIAGNOSIS — Y939 Activity, unspecified: Secondary | ICD-10-CM | POA: Insufficient documentation

## 2019-11-22 DIAGNOSIS — Y929 Unspecified place or not applicable: Secondary | ICD-10-CM | POA: Insufficient documentation

## 2019-11-22 DIAGNOSIS — M25521 Pain in right elbow: Secondary | ICD-10-CM | POA: Insufficient documentation

## 2019-11-22 DIAGNOSIS — F17290 Nicotine dependence, other tobacco product, uncomplicated: Secondary | ICD-10-CM | POA: Insufficient documentation

## 2019-11-22 DIAGNOSIS — R531 Weakness: Secondary | ICD-10-CM | POA: Diagnosis not present

## 2019-11-22 DIAGNOSIS — R079 Chest pain, unspecified: Secondary | ICD-10-CM | POA: Diagnosis not present

## 2019-11-22 DIAGNOSIS — R0789 Other chest pain: Secondary | ICD-10-CM | POA: Insufficient documentation

## 2019-11-22 DIAGNOSIS — I10 Essential (primary) hypertension: Secondary | ICD-10-CM | POA: Insufficient documentation

## 2019-11-22 DIAGNOSIS — Y999 Unspecified external cause status: Secondary | ICD-10-CM | POA: Insufficient documentation

## 2019-11-22 DIAGNOSIS — X58XXXA Exposure to other specified factors, initial encounter: Secondary | ICD-10-CM | POA: Insufficient documentation

## 2019-11-22 DIAGNOSIS — S4992XA Unspecified injury of left shoulder and upper arm, initial encounter: Secondary | ICD-10-CM | POA: Diagnosis present

## 2019-11-22 DIAGNOSIS — S43422A Sprain of left rotator cuff capsule, initial encounter: Secondary | ICD-10-CM | POA: Diagnosis not present

## 2019-11-22 DIAGNOSIS — M25522 Pain in left elbow: Secondary | ICD-10-CM | POA: Diagnosis not present

## 2019-11-22 NOTE — ED Triage Notes (Signed)
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

## 2019-11-23 ENCOUNTER — Emergency Department (HOSPITAL_BASED_OUTPATIENT_CLINIC_OR_DEPARTMENT_OTHER)
Admission: EM | Admit: 2019-11-23 | Discharge: 2019-11-23 | Disposition: A | Discharge status: Home / Self Care | Payer: Medicaid Other | Attending: Emergency Medicine | Admitting: Emergency Medicine

## 2019-11-23 DIAGNOSIS — M25521 Pain in right elbow: Secondary | ICD-10-CM

## 2019-11-23 DIAGNOSIS — R0789 Other chest pain: Secondary | ICD-10-CM

## 2019-11-23 DIAGNOSIS — S43422A Sprain of left rotator cuff capsule, initial encounter: Secondary | ICD-10-CM

## 2019-11-23 MED ORDER — MELOXICAM 15 MG PO TABS: ORAL_TABLET | ORAL | 0 refills | Status: DC

## 2019-11-23 MED ORDER — NAPROXEN 250 MG PO TABS
500.0000 mg | ORAL_TABLET | Freq: Once | ORAL | Status: AC
  Administered 2019-11-23: 500 mg via ORAL
  Filled 2019-11-23: qty 2

## 2019-11-23 MED ORDER — ONDANSETRON 8 MG PO TBDP
8.0000 mg | ORAL_TABLET | Freq: Once | ORAL | Status: AC
  Administered 2019-11-23: 02:00:00 8 mg via ORAL
  Filled 2019-11-23: qty 1

## 2019-11-23 MED ORDER — HYDROCODONE-ACETAMINOPHEN 5-325 MG PO TABS: 1.0000 | ORAL_TABLET | Freq: Four times a day (QID) | ORAL | 0 refills | Status: DC | PRN

## 2019-11-23 NOTE — ED Provider Notes (Signed)
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  She is here with a 3-day history of pain in her left shoulder and lesser pain in her elbows bilaterally.  She is also having pain in her chest wall.  Her pains are worse with movement.  The left shoulder pain is rated as a 10 out of 10 and worse with attempted abduction or internal rotation of the left shoulder.  She denies a specific single injury that caused this.  She has also been having retching and diarrhea for the past several days.  She is off work for the next 4 days.   Past Medical History:  Diagnosis Date  . Dysmenorrhea   . Hypertension     Past Surgical History:  Procedure Laterality Date  . CHOLECYSTECTOMY    . TUBAL LIGATION      No family history on file.  Social History   Tobacco Use  . Smoking status: Current Every Day Smoker    Types: Cigars  . Smokeless tobacco: Never Used  Substance Use Topics  . Alcohol use: No  . Drug use: Yes    Types: Marijuana    Prior to Admission medications   Medication Sig Start Date End Date Taking? Authorizing Provider  amLODipine (NORVASC) 5 MG tablet Take 1 tablet (5 mg total) by mouth daily. 01/27/18   Ward, Delice Bison, DO  Capsaicin-Menthol-Methyl Sal (CAPSAICIN-METHYL SAL-MENTHOL) 0.025-1-12 % CREA Apply 1 application topically every 6 (six) hours as needed. Rubbed onto your abdomen every 6 hours if needed for pain and nausea 06/23/19   Charlesetta Shanks, MD  HYDROcodone-acetaminophen (NORCO) 5-325 MG tablet Take 1 tablet by mouth every 6 (six) hours as needed for severe pain. 11/23/19   Jaydon Soroka, MD  meloxicam (MOBIC) 15 MG tablet Take 1 tablet daily as needed for pain. 11/23/19   Jabri Blancett, Jenny Reichmann, MD  ondansetron (ZOFRAN ODT) 4 MG  disintegrating tablet Take 1 tablet (4 mg total) by mouth every 4 (four) hours as needed for nausea or vomiting. 06/23/19   Charlesetta Shanks, MD  promethazine (PHENERGAN) 25 MG tablet Take 1 tablet (25 mg total) by mouth every 6 (six) hours as needed for nausea or vomiting. 06/23/19   Charlesetta Shanks, MD    Allergies Patient has no known allergies.   REVIEW OF SYSTEMS  Negative except as noted here or in the History of Present Illness.   PHYSICAL EXAMINATION  Initial Vital Signs Blood pressure (!) 171/88, pulse 81, temperature 98.4 F (36.9 C), temperature source Oral, resp. rate 16, height 5' (1.524 m), weight 81.6 kg, last menstrual period 11/08/2019, SpO2 100 %.  Examination General: Well-developed, well-nourished female in no acute distress; appearance consistent with age of record HENT: normocephalic; atraumatic Eyes: Normal appearance Neck: supple Heart: regular rate and rhythm Lungs: clear to auscultation bilaterally Chest: Upper sternal tenderness Abdomen: soft; nondistended; nontender; bowel sounds present Extremities: No deformity; tenderness of elbows bilaterally primarily over the lateral epicondyles; pain on attempted abduction and internal rotation of left shoulder with tenderness on palpation of anterior left shoulder Neurologic: Awake, alert and oriented; motor function intact in all extremities and symmetric; no facial droop Skin: Warm and dry Psychiatric: Normal mood and affect   RESULTS  Summary of this visit's results, reviewed  and interpreted by myself:   EKG Interpretation  Date/Time:    Ventricular Rate:    PR Interval:    QRS Duration:   QT Interval:    QTC Calculation:   R Axis:     Text Interpretation:        Laboratory Studies: No results found for this or any previous visit (from the past 24 hour(s)). Imaging Studies: No results found.  ED COURSE and MDM  Nursing notes, initial and subsequent vitals signs, including pulse oximetry,  reviewed and interpreted by myself.  Vitals:   11/22/19 2224 11/22/19 2225  BP:  (!) 171/88  Pulse:  81  Resp:  16  Temp:  98.4 F (36.9 C)  TempSrc:  Oral  SpO2:  100%  Weight: 81.6 kg   Height: 5' (1.524 m)    Medications  naproxen (NAPROSYN) tablet 500 mg (has no administration in time range)  ondansetron (ZOFRAN-ODT) disintegrating tablet 8 mg (has no administration in time range)    Examination consistent with left rotator cuff syndrome with bilateral elbow pain possibly lateral epicondylitis.  Will start on an NSAID and refer to sports medicine.  PROCEDURES  Procedures   ED DIAGNOSES     ICD-10-CM   1. Sprain of left rotator cuff capsule, initial encounter  S43.422A   2. Bilateral elbow joint pain  M25.521    M25.522   3. Chest wall pain  R07.89        Paula Libra, MD 11/23/19 [phone]

## 2019-11-28 ENCOUNTER — Other Ambulatory Visit: Payer: Self-pay

## 2019-11-28 ENCOUNTER — Ambulatory Visit: Payer: Self-pay

## 2019-11-28 ENCOUNTER — Encounter: Payer: Self-pay | Admitting: Family Medicine

## 2019-11-28 ENCOUNTER — Ambulatory Visit (INDEPENDENT_AMBULATORY_CARE_PROVIDER_SITE_OTHER): Payer: Medicaid Other | Admitting: Family Medicine

## 2019-11-28 VITALS — BP 226/123 | HR 98 | Ht 60.0 in | Wt 180.0 lb

## 2019-11-28 DIAGNOSIS — M67432 Ganglion, left wrist: Secondary | ICD-10-CM

## 2019-11-28 DIAGNOSIS — M778 Other enthesopathies, not elsewhere classified: Secondary | ICD-10-CM | POA: Diagnosis not present

## 2019-11-28 DIAGNOSIS — M25522 Pain in left elbow: Secondary | ICD-10-CM | POA: Diagnosis not present

## 2019-11-28 DIAGNOSIS — M25529 Pain in unspecified elbow: Secondary | ICD-10-CM

## 2019-11-28 DIAGNOSIS — I10 Essential (primary) hypertension: Secondary | ICD-10-CM | POA: Diagnosis not present

## 2019-11-28 DIAGNOSIS — M779 Enthesopathy, unspecified: Secondary | ICD-10-CM | POA: Insufficient documentation

## 2019-11-28 DIAGNOSIS — M25512 Pain in left shoulder: Secondary | ICD-10-CM

## 2019-11-28 HISTORY — DX: Pain in unspecified elbow: M25.529

## 2019-11-28 HISTORY — DX: Ganglion, left wrist: M67.432

## 2019-11-28 HISTORY — DX: Essential (primary) hypertension: I10

## 2019-11-28 HISTORY — DX: Enthesopathy, unspecified: M77.9

## 2019-11-28 MED ORDER — PREDNISONE 5 MG PO TABS: ORAL_TABLET | ORAL | 0 refills | Status: DC

## 2019-11-28 NOTE — Assessment & Plan Note (Signed)
No structural changes noticed on ultrasound.  May have lateral epicondylitis.  May be referred pain from the shoulder. -Counseled on supportive care. -We will monitor for now as the shoulder progresses. -Could consider nitro or physical therapy.

## 2019-11-28 NOTE — Assessment & Plan Note (Signed)
Has an encircling effusion of the BT. Seems more capsular in nature.  - prednisone  - counseled on sling  - counseled on HEP and supportive care -Provided work note. -If no improvement consider injection and imaging or physical therapy.

## 2019-11-28 NOTE — Patient Instructions (Signed)
Nice to meet you Please try to alternate heat and ice  Please try the range of motion exercises  Please try to come out of the brace in 2 weeks   Please send me a message in MyChart with any questions or updates.  Please see me back in 4 weeks.   --Dr. Jordan Likes

## 2019-11-28 NOTE — Assessment & Plan Note (Signed)
Has some encircling around one of the dorsal compartments.  No significant pain. -Counseled supportive care. -Could aspirate going forward.

## 2019-11-28 NOTE — Assessment & Plan Note (Signed)
Significantly elevated.  Seems to be elevated upon chart review of the emergency department.  Does not currently have a primary care doctor. -Counseled on getting follow-up measurements. -May need to consider amlodipine.

## 2019-11-28 NOTE — Progress Notes (Signed)
Janice French - 39 y.o. female MRN [id number]  Date of birth: Aug 13, 1981  SUBJECTIVE:  Including CC & ROS.  Chief Complaint  Patient presents with  . Shoulder Injury    left shoulder x 11/20/2019    Janice French is a 39 y.o. female that is presenting with left shoulder pain, left elbow pain and a left dorsal wrist mass.  The elbow and shoulder have been occurring with work.  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  The shoulder pain was severe and was seen in the emergency department on 12/28.  The pain can be minimal when it is in the sling.  It can be severe and it is worse at night.  She denies any history of similar symptoms.  Has gradually progressed.  She does have a some improvement since using the pain medication from the emergency department.  She has pain at night and has difficulty sleeping.  The elbow pain is occurring over the lateral epicondyle.  Seems to be localized to this area.  The left wrist mass is occurring over the dorsal wrist.  This is been ongoing for some time but denies any specific pain.    Review of Systems See HPI  HISTORY: Past Medical, Surgical, Social, and Family History Reviewed & Updated per EMR.   Pertinent Historical Findings include:  Past Medical History:  Diagnosis Date  . Dysmenorrhea   . Hypertension     Past Surgical History:  Procedure Laterality Date  . CHOLECYSTECTOMY    . TUBAL LIGATION      No Known Allergies  No family history on file.   Social History   Socioeconomic History  . Marital status: Single    Spouse name: Not on file  . Number of children: Not on file  . Years of education: Not on file  . Highest education level: Not on file  Occupational History  . Not on file  Tobacco Use  . Smoking status: Current Every Day Smoker    Types: Cigars  . Smokeless tobacco: Never Used  Substance and Sexual Activity  . Alcohol use: No  . Drug use: Yes    Types: Marijuana  . Sexual activity: Never   Birth control/protection: Surgical  Other Topics Concern  . Not on file  Social History Narrative  . Not on file   Social Determinants of Health   Financial Resource Strain:   . Difficulty of Paying Living Expenses: Not on file  Food Insecurity:   . Worried About Charity fundraiser in the Last Year: Not on file  . Ran Out of Food in the Last Year: Not on file  Transportation Needs:   . Lack of Transportation (Medical): Not on file  . Lack of Transportation (Non-Medical): Not on file  Physical Activity:   . Days of Exercise per Week: Not on file  . Minutes of Exercise per Session: Not on file  Stress:   . Feeling of Stress : Not on file  Social Connections:   . Frequency of Communication with Friends and Family: Not on file  . Frequency of Social Gatherings with Friends and Family: Not on file  . Attends Religious Services: Not on file  . Active Member of Clubs or Organizations: Not on file  . Attends Archivist Meetings: Not on file  . Marital Status: Not on file  Intimate Partner Violence:   . Fear of Current or Ex-Partner: Not on file  . Emotionally Abused: Not  on file  . Physically Abused: Not on file  . Sexually Abused: Not on file     PHYSICAL EXAM:  VS: BP (!) 226/123   Pulse 98   Ht 5' (1.524 m)   Wt 180 lb (81.6 kg)   LMP 11/08/2019   BMI 35.15 kg/m  Physical Exam Gen: NAD, alert, cooperative with exam, well-appearing ENT: normal lips, normal nasal mucosa,  Eye: normal EOM, normal conjunctiva and lids CV:  no edema, +2 pedal pulses   Resp: no accessory muscle use, non-labored,   Skin: no rashes, no areas of induration  Neuro: normal tone, normal sensation to touch Psych:  normal insight, alert and oriented MSK:  Left shoulder: Limited external rotation. Limited external rotation and abduction. Has near normal flexion. Normal strength resistance with internal and external rotation. Some pain with empty can testing. Pain with speeds  test. Left elbow: No obvious swelling or ecchymosis. No significant tenderness over the lateral epicondyle. Normal range of motion. Left wrist: Obvious cyst over the dorsum of the wrist. No redness  Normal wrist range of motion. Neurovascularly intact  Limited ultrasound: Left shoulder, left elbow, left wrist:  Left shoulder: An encircling effusion that is mild noticed of the biceps tendon and long and short axis. Normal-appearing subscapularis. Supraspinatus with more chronic changes but no acute findings. No significant changes served in the posterior glenohumeral joint.  Left elbow: Normal-appearing origin at the lateral epicondyle. No significant mucoid changes of the extensor tendons. No hyperemia in this area.  Left wrist: An encircling ganglion cyst noticed on the dorsum of the wrist.  Summary: This shoulder with findings suggestive of capsulitis.  Left elbow with no significant findings of her pain.  Left wrist with findings suggestive of ganglion cyst.  Ultrasound and interpretation by Clare Gandy, MD    ASSESSMENT & PLAN:   Capsulitis of left shoulder Has an encircling effusion of the BT. Seems more capsular in nature.  - prednisone  - counseled on sling  - counseled on HEP and supportive care -Provided work note. -If no improvement consider injection and imaging or physical therapy.  Essential hypertension Significantly elevated.  Seems to be elevated upon chart review of the emergency department.  Does not currently have a primary care doctor. -Counseled on getting follow-up measurements. -May need to consider amlodipine.  Left elbow pain No structural changes noticed on ultrasound.  May have lateral epicondylitis.  May be referred pain from the shoulder. -Counseled on supportive care. -We will monitor for now as the shoulder progresses. -Could consider nitro or physical therapy.  Ganglion cyst of dorsum of left wrist Has some encircling around  one of the dorsal compartments.  No significant pain. -Counseled supportive care. -Could aspirate going forward.

## 2019-12-04 ENCOUNTER — Telehealth: Payer: Self-pay | Admitting: Family Medicine

## 2019-12-04 NOTE — Telephone Encounter (Signed)
Patient called requesting a note to be excused from work. She stayed out of work last week, Tuesday through Friday, and today, due to her blood pressure being high. She states she was feeling light headed and dizzy.  She does not remember the exact reading last week but said it was " 2 something over 1 something". Today her BP was 153/119

## 2019-12-05 NOTE — Telephone Encounter (Signed)
Patient called back asking about work note. She was able to go back to work today. Requesting note to be excused for last week and yesterday, January 11th.

## 2019-12-05 NOTE — Telephone Encounter (Signed)
Spoke with patient and she states she will come by the office to pick up the letter today

## 2019-12-21 ENCOUNTER — Ambulatory Visit: Payer: Medicaid Other | Admitting: Family Medicine

## 2019-12-22 ENCOUNTER — Encounter: Payer: Self-pay | Admitting: Family Medicine

## 2019-12-22 ENCOUNTER — Ambulatory Visit (INDEPENDENT_AMBULATORY_CARE_PROVIDER_SITE_OTHER): Payer: Medicaid Other | Admitting: Family Medicine

## 2019-12-22 ENCOUNTER — Other Ambulatory Visit: Payer: Self-pay

## 2019-12-22 ENCOUNTER — Ambulatory Visit: Payer: Self-pay

## 2019-12-22 VITALS — BP 180/123 | HR 89 | Ht 60.0 in | Wt 180.0 lb

## 2019-12-22 DIAGNOSIS — M778 Other enthesopathies, not elsewhere classified: Secondary | ICD-10-CM

## 2019-12-22 MED ORDER — TRIAMCINOLONE ACETONIDE 40 MG/ML IJ SUSP
40.0000 mg | Freq: Once | INTRAMUSCULAR | Status: AC
  Administered 2019-12-22: 40 mg via INTRA_ARTICULAR

## 2019-12-22 NOTE — Assessment & Plan Note (Signed)
Pain is worsening.  Has good rotation but still appears to be the capsule as a source of the pain. -Glenohumeral injection. -Counseled on home exercise therapy and supportive care. -Could consider subacromial injection and physical therapy.

## 2019-12-22 NOTE — Patient Instructions (Signed)
Good to see you Happy early Birthday Please try heat or ice  Please try the exercises   Please send me a message in MyChart with any questions or updates.  Please see me back in 4 weeks.   --Dr. Jordan Likes

## 2019-12-22 NOTE — Progress Notes (Signed)
Janice French - 39 y.o. female MRN [id number]  Date of birth: [birth year]  SUBJECTIVE:  Including CC & ROS.  Chief Complaint  Patient presents with  . Follow-up    follow up for left shoulder    Janice French is a 39 y.o. female that is presenting with worsening left shoulder pain.  She has tried the medication with limited improvement.  Has been using the sling to help with the pain.  The pain seems to be worse if she is cold.  Has not used ice.  Has pain and external rotation.   Review of Systems See HPI   HISTORY: Past Medical, Surgical, Social, and Family History Reviewed & Updated per EMR.   Pertinent Historical Findings include:  Past Medical History:  Diagnosis Date  . Dysmenorrhea   . Hypertension     Past Surgical History:  Procedure Laterality Date  . CHOLECYSTECTOMY    . TUBAL LIGATION      No Known Allergies  No family history on file.   Social History   Socioeconomic History  . Marital status: Single    Spouse name: Not on file  . Number of children: Not on file  . Years of education: Not on file  . Highest education level: Not on file  Occupational History  . Not on file  Tobacco Use  . Smoking status: Current Every Day Smoker    Types: Cigars  . Smokeless tobacco: Never Used  Substance and Sexual Activity  . Alcohol use: No  . Drug use: Yes    Types: Marijuana  . Sexual activity: Never    Birth control/protection: Surgical  Other Topics Concern  . Not on file  Social History Narrative  . Not on file   Social Determinants of Health   Financial Resource Strain:   . Difficulty of Paying Living Expenses: Not on file  Food Insecurity:   . Worried About Programme researcher, broadcasting/film/video in the Last Year: Not on file  . Ran Out of Food in the Last Year: Not on file  Transportation Needs:   . Lack of Transportation (Medical): Not on file  . Lack of Transportation (Non-Medical): Not on file  Physical Activity:   . Days of Exercise per Week: Not on file   . Minutes of Exercise per Session: Not on file  Stress:   . Feeling of Stress : Not on file  Social Connections:   . Frequency of Communication with Friends and Family: Not on file  . Frequency of Social Gatherings with Friends and Family: Not on file  . Attends Religious Services: Not on file  . Active Member of Clubs or Organizations: Not on file  . Attends Banker Meetings: Not on file  . Marital Status: Not on file  Intimate Partner Violence:   . Fear of Current or Ex-Partner: Not on file  . Emotionally Abused: Not on file  . Physically Abused: Not on file  . Sexually Abused: Not on file     PHYSICAL EXAM:  VS: BP (!) 180/123   Pulse 89   Ht 5' (1.524 m)   Wt 180 lb (81.6 kg)   BMI 35.15 kg/m  Physical Exam Gen: NAD, alert, cooperative with exam, well-appearing ENT: normal lips, normal nasal mucosa,  Eye: normal EOM, normal conjunctiva and lids Skin: no rashes, no areas of induration  Neuro: normal tone, normal sensation to touch Psych:  normal insight, alert and oriented MSK:  Left shoulder: Normal external rotation. Pain with  external rotation and abduction. No pain with internal rotation. No pain with empty can testing. Neurovascularly intact   Aspiration/Injection Procedure Note Janice French August 10, 1981  Procedure: Injection Indications: Left shoulder pain  Procedure Details Consent: Risks of procedure as well as the alternatives and risks of each were explained to the (patient/caregiver).  Consent for procedure obtained. Time Out: Verified patient identification, verified procedure, site/side was marked, verified correct patient position, special equipment/implants available, medications/allergies/relevent history reviewed, required imaging and test results available.  Performed.  The area was cleaned with iodine and alcohol swabs.    The left glenohumeral joint was injected using 1 cc's of 40 mg Kenalog and 4 cc's of 0.25% bupivacaine with  a 22 3 1/2" needle.  Ultrasound was used. Images were obtained in short views showing the injection.     A sterile dressing was applied.  Patient did tolerate procedure well.     ASSESSMENT & PLAN:   Capsulitis of left shoulder Pain is worsening.  Has good rotation but still appears to be the capsule as a source of the pain. -Glenohumeral injection. -Counseled on home exercise therapy and supportive care. -Could consider subacromial injection and physical therapy.

## 2019-12-29 ENCOUNTER — Ambulatory Visit: Payer: Medicaid Other | Admitting: Family Medicine

## 2020-01-08 DIAGNOSIS — F410 Panic disorder [episodic paroxysmal anxiety] without agoraphobia: Secondary | ICD-10-CM | POA: Diagnosis not present

## 2020-01-19 ENCOUNTER — Ambulatory Visit (INDEPENDENT_AMBULATORY_CARE_PROVIDER_SITE_OTHER): Payer: Medicaid Other | Admitting: Family Medicine

## 2020-01-19 ENCOUNTER — Encounter: Payer: Self-pay | Admitting: Family Medicine

## 2020-01-19 ENCOUNTER — Other Ambulatory Visit: Payer: Self-pay

## 2020-01-19 DIAGNOSIS — M778 Other enthesopathies, not elsewhere classified: Secondary | ICD-10-CM | POA: Diagnosis not present

## 2020-01-19 NOTE — Progress Notes (Signed)
  Janice French - 39 y.o. female MRN [id number]  Date of birth: March 03, 1981  SUBJECTIVE:  Including CC & ROS.  Chief Complaint  Patient presents with  . Follow-up    follow up for left shoulder    Janice French is a 39 y.o. female that is following up for her left shoulder pain.  She received an injection and has been doing well since that time.  Pain is minimal to none.  Denies any lack of range of motion.   Review of Systems See HPI   HISTORY: Past Medical, Surgical, Social, and Family History Reviewed & Updated per EMR.   Pertinent Historical Findings include:  Past Medical History:  Diagnosis Date  . Dysmenorrhea   . Hypertension     Past Surgical History:  Procedure Laterality Date  . CHOLECYSTECTOMY    . TUBAL LIGATION      No family history on file.  Social History   Socioeconomic History  . Marital status: Single    Spouse name: Not on file  . Number of children: Not on file  . Years of education: Not on file  . Highest education level: Not on file  Occupational History  . Not on file  Tobacco Use  . Smoking status: Current Every Day Smoker    Types: Cigars  . Smokeless tobacco: Never Used  Substance and Sexual Activity  . Alcohol use: No  . Drug use: Yes    Types: Marijuana  . Sexual activity: Never    Birth control/protection: Surgical  Other Topics Concern  . Not on file  Social History Narrative  . Not on file   Social Determinants of Health   Financial Resource Strain:   . Difficulty of Paying Living Expenses: Not on file  Food Insecurity:   . Worried About Programme researcher, broadcasting/film/video in the Last Year: Not on file  . Ran Out of Food in the Last Year: Not on file  Transportation Needs:   . Lack of Transportation (Medical): Not on file  . Lack of Transportation (Non-Medical): Not on file  Physical Activity:   . Days of Exercise per Week: Not on file  . Minutes of Exercise per Session: Not on file  Stress:   . Feeling of Stress : Not on file    Social Connections:   . Frequency of Communication with Friends and Family: Not on file  . Frequency of Social Gatherings with Friends and Family: Not on file  . Attends Religious Services: Not on file  . Active Member of Clubs or Organizations: Not on file  . Attends Banker Meetings: Not on file  . Marital Status: Not on file  Intimate Partner Violence:   . Fear of Current or Ex-Partner: Not on file  . Emotionally Abused: Not on file  . Physically Abused: Not on file  . Sexually Abused: Not on file     PHYSICAL EXAM:  VS: BP (!) 155/105   Pulse 87   Ht 5' (1.524 m)   Wt 180 lb (81.6 kg)   BMI 35.15 kg/m  Physical Exam Gen: NAD, alert, cooperative with exam, well-appearing MSK:  Shoulder: Normal internal and external rotation. Normal strength resistance. Normal abduction and flexion. Normal grip strength. Neurovascularly intact     ASSESSMENT & PLAN:   Capsulitis of left shoulder Has significant improvement in function and pain. -Counseled on exercise therapy and supportive care. -Provided work note. -Can follow-up as needed.

## 2020-01-19 NOTE — Patient Instructions (Signed)
Good to see you Pleas use ice if needed  Please try the to continue the exercises every so often   Please send me a message in MyChart with any questions or updates.  Please see [place] back as needed.   --Dr. Jordan Likes

## 2020-01-19 NOTE — Assessment & Plan Note (Signed)
Has significant improvement in function and pain. -Counseled on exercise therapy and supportive care. -Provided work note. -Can follow-up as needed.

## 2020-02-15 ENCOUNTER — Emergency Department (HOSPITAL_BASED_OUTPATIENT_CLINIC_OR_DEPARTMENT_OTHER)
Admission: EM | Admit: 2020-02-15 | Discharge: 2020-02-15 | Disposition: A | Discharge status: Home / Self Care | Payer: Medicaid Other | Attending: Emergency Medicine | Admitting: Emergency Medicine

## 2020-02-15 ENCOUNTER — Other Ambulatory Visit: Payer: Self-pay

## 2020-02-15 ENCOUNTER — Encounter (HOSPITAL_BASED_OUTPATIENT_CLINIC_OR_DEPARTMENT_OTHER): Payer: Self-pay | Admitting: Emergency Medicine

## 2020-02-15 DIAGNOSIS — X58XXXA Exposure to other specified factors, initial encounter: Secondary | ICD-10-CM | POA: Insufficient documentation

## 2020-02-15 DIAGNOSIS — I10 Essential (primary) hypertension: Secondary | ICD-10-CM | POA: Diagnosis not present

## 2020-02-15 DIAGNOSIS — S4992XA Unspecified injury of left shoulder and upper arm, initial encounter: Secondary | ICD-10-CM | POA: Diagnosis present

## 2020-02-15 DIAGNOSIS — S46912A Strain of unspecified muscle, fascia and tendon at shoulder and upper arm level, left arm, initial encounter: Secondary | ICD-10-CM | POA: Diagnosis not present

## 2020-02-15 DIAGNOSIS — Y999 Unspecified external cause status: Secondary | ICD-10-CM | POA: Insufficient documentation

## 2020-02-15 DIAGNOSIS — Z79899 Other long term (current) drug therapy: Secondary | ICD-10-CM | POA: Diagnosis not present

## 2020-02-15 DIAGNOSIS — Y929 Unspecified place or not applicable: Secondary | ICD-10-CM | POA: Diagnosis not present

## 2020-02-15 DIAGNOSIS — F17210 Nicotine dependence, cigarettes, uncomplicated: Secondary | ICD-10-CM | POA: Diagnosis not present

## 2020-02-15 DIAGNOSIS — Y939 Activity, unspecified: Secondary | ICD-10-CM | POA: Diagnosis not present

## 2020-02-15 MED ORDER — CYCLOBENZAPRINE HCL 5 MG PO TABS
5.0000 mg | ORAL_TABLET | Freq: Once | ORAL | Status: AC
  Administered 2020-02-15: 5 mg via ORAL
  Filled 2020-02-15: qty 1

## 2020-02-15 MED ORDER — HYDROCODONE-ACETAMINOPHEN 5-325 MG PO TABS
1.0000 | ORAL_TABLET | Freq: Once | ORAL | Status: AC
  Administered 2020-02-15: 1 via ORAL
  Filled 2020-02-15: qty 1

## 2020-02-15 MED ORDER — CYCLOBENZAPRINE HCL 10 MG PO TABS: 10.0000 mg | ORAL_TABLET | Freq: Two times a day (BID) | ORAL | 0 refills | Status: DC | PRN

## 2020-02-15 NOTE — ED Provider Notes (Signed)
[facility] EMERGENCY DEPARTMENT Provider Note   CSN: [id number] Arrival date & time: 02/15/20  2055     History Chief Complaint  Patient presents with  . Shoulder Pain  . Wrist Pain    Janice French is a 39 y.o. female.  Pain and stiffness in bilateral hands as well.  No new injury.  Chronic issues.  The history is provided by the patient.  Shoulder Pain Location:  Shoulder Shoulder location:  L shoulder Injury: no   Pain details:    Quality:  Aching   Radiates to:  Does not radiate   Severity:  Mild   Onset quality:  Gradual   Timing:  Intermittent   Progression:  Waxing and waning Relieved by:  Arthritis medications Worsened by:  Movement Associated symptoms: stiffness   Associated symptoms: no back pain, no decreased range of motion, no fatigue, no fever and no neck pain        Past Medical History:  Diagnosis Date  . Dysmenorrhea   . Hypertension     Patient Active Problem List   Diagnosis Date Noted  . Capsulitis of left shoulder 11/28/2019  . Left elbow pain 11/28/2019  . Ganglion cyst of dorsum of left wrist 11/28/2019  . Essential hypertension 11/28/2019    Past Surgical History:  Procedure Laterality Date  . CHOLECYSTECTOMY    . TUBAL LIGATION       OB History   No obstetric history on file.     History reviewed. No pertinent family history.  Social History   Tobacco Use  . Smoking status: Current Every Day Smoker    Types: Cigars  . Smokeless tobacco: Never Used  Substance Use Topics  . Alcohol use: No  . Drug use: Yes    Types: Marijuana    Home Medications Prior to Admission medications   Medication Sig Start Date End Date Taking? Authorizing Provider  amLODipine (NORVASC) 5 MG tablet Take 1 tablet (5 mg total) by mouth daily. 01/27/18   Ward, Layla Maw, DO  Capsaicin-Menthol-Methyl Sal (CAPSAICIN-METHYL SAL-MENTHOL) 0.025-1-12 % CREA Apply 1 application topically every 6 (six) hours as needed. Rubbed onto your  abdomen every 6 hours if needed for pain and nausea 06/23/19   Arby Barrette, MD  cyclobenzaprine (FLEXERIL) 10 MG tablet Take 1 tablet (10 mg total) by mouth 2 (two) times daily as needed for up to 20 doses for muscle spasms. 02/15/20   Meron Bocchino, DO  HYDROcodone-acetaminophen (NORCO) 5-325 MG tablet Take 1 tablet by mouth every 6 (six) hours as needed for severe pain. 11/23/19   Molpus, John, MD  meloxicam (MOBIC) 15 MG tablet Take 1 tablet daily as needed for pain. 11/23/19   Molpus, Jonny Ruiz, MD  ondansetron (ZOFRAN ODT) 4 MG disintegrating tablet Take 1 tablet (4 mg total) by mouth every 4 (four) hours as needed for nausea or vomiting. 06/23/19   Arby Barrette, MD  predniSONE (DELTASONE) 5 MG tablet Take 6 pills for first day, 5 pills second day, 4 pills third day, 3 pills fourth day, 2 pills the fifth day, and 1 pill sixth day. 11/28/19   Myra Rude, MD  promethazine (PHENERGAN) 25 MG tablet Take 1 tablet (25 mg total) by mouth every 6 (six) hours as needed for nausea or vomiting. 06/23/19   Arby Barrette, MD    Allergies    Patient has no known allergies.  Review of Systems   Review of Systems  Constitutional: Negative for chills, fatigue and fever.  HENT: Negative for ear pain and sore throat.   Eyes: Negative for pain and visual disturbance.  Respiratory: Negative for cough and shortness of breath.   Cardiovascular: Negative for chest pain and palpitations.  Gastrointestinal: Negative for abdominal pain and vomiting.  Genitourinary: Negative for dysuria and hematuria.  Musculoskeletal: Positive for arthralgias and stiffness. Negative for back pain, gait problem, joint swelling, myalgias, neck pain and neck stiffness.  Skin: Negative for color change and rash.  Neurological: Negative for seizures and syncope.  All other systems reviewed and are negative.   Physical Exam Updated Vital Signs BP (!) 180/115 (BP Location: Right Arm)   Pulse 89   Temp 98.1 F (36.7 C)  (Oral)   Resp 18   Ht 5' (1.524 m)   Wt 81.6 kg   LMP 02/15/2020 Comment: started menses today  SpO2 98%   BMI 35.15 kg/m   Physical Exam Vitals and nursing note reviewed.  Constitutional:      General: She is not in acute distress.    Appearance: She is well-developed.  HENT:     Head: Normocephalic and atraumatic.  Eyes:     Conjunctiva/sclera: Conjunctivae normal.  Cardiovascular:     Rate and Rhythm: Normal rate and regular rhythm.     Heart sounds: No murmur.  Pulmonary:     Effort: Pulmonary effort is normal. No respiratory distress.     Breath sounds: Normal breath sounds.  Abdominal:     Palpations: Abdomen is soft.     Tenderness: There is no abdominal tenderness.  Musculoskeletal:        General: Tenderness present. No swelling. Normal range of motion.     Cervical back: Neck supple.     Comments: Tenderness to bilateral wrists, left shoulder, however normal range of motion, no swelling in these areas  Skin:    General: Skin is warm and dry.     Capillary Refill: Capillary refill takes less than 2 seconds.  Neurological:     General: No focal deficit present.     Mental Status: She is alert.     Sensory: No sensory deficit.     Motor: No weakness.     ED Results / Procedures / Treatments   Labs (all labs ordered are listed, but only abnormal results are displayed) Labs Reviewed - No data to display  EKG None  Radiology No results found.  Procedures Procedures (including critical care time)  Medications Ordered in ED Medications  HYDROcodone-acetaminophen (NORCO/VICODIN) 5-325 MG per tablet 1 tablet (1 tablet Oral Given 02/15/20 2141)  cyclobenzaprine (FLEXERIL) tablet 5 mg (5 mg Oral Given 02/15/20 2142)    ED Course  I have reviewed the triage vital signs and the nursing notes.  Pertinent labs & imaging results that were available during my care of the patient were reviewed by me and considered in my medical decision making (see chart for  details).    MDM Rules/Calculators/A&P                      Janice French is a 39 year old female with history of chronic left shoulder pain, bilateral wrist pain who presents with worsening pain.  This is a chronic injury from her job.  Has taken Tylenol without much relief.  Neurovascularly she is intact in her upper extremities.  Good strength and sensation.  Appears to likely have some bilateral carpal tunnel in her wrists.  Probably some arthritis in her shoulder from overuse.  No specific  trauma.  No need for imaging at this time.  Likely possibly muscle spasm as well.  Given Flexeril and Norco.  Recommend continued use of Tylenol, Motrin, Flexeril.  We will have her follow-up with sports medicine which whom she follows with.  Given time off of work.  Discharged in good condition.  This chart was dictated using voice recognition software.  Despite best efforts to proofread,  errors can occur which can change the documentation meaning.   Final Clinical Impression(s) / ED Diagnoses Final diagnoses:  Strain of left shoulder, initial encounter    Rx / DC Orders ED Discharge Orders         Ordered    cyclobenzaprine (FLEXERIL) 10 MG tablet  2 times daily PRN     02/15/20 2222           Virgina Norfolk, DO 02/15/20 2236

## 2020-02-15 NOTE — ED Triage Notes (Signed)
  Patient comes in with L shoulder/arm pain, and bilateral wrist pain.  Patient was seen on 12/31 after injury at work and diagnosed with sprain L shoulder joint capsule.  Has been following up with Dr. Jordan Likes but the pain has gotten worse over the last week.  Patient states she wasn't given enough time to heal from her job and keeps aggravating the shoulder injury.  Pain 10/10.   Took tylenol 1000 mg around 1400.

## 2020-02-26 ENCOUNTER — Ambulatory Visit: Payer: Medicaid Other | Admitting: Family Medicine

## 2020-02-26 NOTE — Progress Notes (Deleted)
  Janice French - 39 y.o. female MRN [id number]  Date of birth: November 15, 1981  SUBJECTIVE:  Including CC & ROS.  No chief complaint on file.   Janice French is a 39 y.o. female that is  ***.  ***   Review of Systems See HPI   HISTORY: Past Medical, Surgical, Social, and Family History Reviewed & Updated per EMR.   Pertinent Historical Findings include:  Past Medical History:  Diagnosis Date  . Dysmenorrhea   . Hypertension     Past Surgical History:  Procedure Laterality Date  . CHOLECYSTECTOMY    . TUBAL LIGATION      No family history on file.  Social History   Socioeconomic History  . Marital status: Single    Spouse name: Not on file  . Number of children: Not on file  . Years of education: Not on file  . Highest education level: Not on file  Occupational History  . Not on file  Tobacco Use  . Smoking status: Current Every Day Smoker    Types: Cigars  . Smokeless tobacco: Never Used  Substance and Sexual Activity  . Alcohol use: No  . Drug use: Yes    Types: Marijuana  . Sexual activity: Never    Birth control/protection: Surgical  Other Topics Concern  . Not on file  Social History Narrative  . Not on file   Social Determinants of Health   Financial Resource Strain:   . Difficulty of Paying Living Expenses:   Food Insecurity:   . Worried About Programme researcher, broadcasting/film/video in the Last Year:   . Barista in the Last Year:   Transportation Needs:   . Freight forwarder (Medical):   Marland Kitchen Lack of Transportation (Non-Medical):   Physical Activity:   . Days of Exercise per Week:   . Minutes of Exercise per Session:   Stress:   . Feeling of Stress :   Social Connections:   . Frequency of Communication with Friends and Family:   . Frequency of Social Gatherings with Friends and Family:   . Attends Religious Services:   . Active Member of Clubs or Organizations:   . Attends Banker Meetings:   Marland Kitchen Marital Status:   Intimate Partner  Violence:   . Fear of Current or Ex-Partner:   . Emotionally Abused:   Marland Kitchen Physically Abused:   . Sexually Abused:      PHYSICAL EXAM:  VS: LMP 02/15/2020 Comment: started menses today Physical Exam Gen: NAD, alert, cooperative with exam, well-appearing MSK:  ***      ASSESSMENT & PLAN:   No problem-specific Assessment & Plan notes found for this encounter.

## 2020-02-27 ENCOUNTER — Other Ambulatory Visit: Payer: Self-pay

## 2020-02-27 ENCOUNTER — Ambulatory Visit: Payer: Medicaid Other | Admitting: Family Medicine

## 2020-02-27 ENCOUNTER — Ambulatory Visit: Payer: Self-pay

## 2020-02-27 ENCOUNTER — Encounter: Payer: Self-pay | Admitting: Family Medicine

## 2020-02-27 VITALS — BP 173/112 | HR 83 | Ht 60.0 in | Wt 180.0 lb

## 2020-02-27 DIAGNOSIS — M25531 Pain in right wrist: Secondary | ICD-10-CM

## 2020-02-27 DIAGNOSIS — M778 Other enthesopathies, not elsewhere classified: Secondary | ICD-10-CM | POA: Diagnosis not present

## 2020-02-27 DIAGNOSIS — G5601 Carpal tunnel syndrome, right upper limb: Secondary | ICD-10-CM

## 2020-02-27 HISTORY — DX: Carpal tunnel syndrome, right upper limb: G56.01

## 2020-02-27 NOTE — Progress Notes (Signed)
Janice French - 39 y.o. female MRN [id number]  Date of birth: 1981/10/21  SUBJECTIVE:  Including CC & ROS.  Chief Complaint  Patient presents with  . Follow-up    follow up for left shoulder/right hand    Janice French is a 39 y.o. female that is presenting with worsening of her left shoulder pain and new onset right hand pain.  Left shoulder was injected and she had been doing well.  Most recently seems to be exacerbated by the movements that she has to do at work.  She is having changes in the palmar and dorsal aspect of the hand.  Seems to be exacerbated by the lifting and movements that she has to do at work.  No history of similar pain.  She noticed some swelling in the fingers as well as the wrist..    Review of Systems See HPI   HISTORY: Past Medical, Surgical, Social, and Family History Reviewed & Updated per EMR.   Pertinent Historical Findings include:  Past Medical History:  Diagnosis Date  . Dysmenorrhea   . Hypertension     Past Surgical History:  Procedure Laterality Date  . CHOLECYSTECTOMY    . TUBAL LIGATION      No family history on file.  Social History   Socioeconomic History  . Marital status: Single    Spouse name: Not on file  . Number of children: Not on file  . Years of education: Not on file  . Highest education level: Not on file  Occupational History  . Not on file  Tobacco Use  . Smoking status: Current Every Day Smoker    Types: Cigars  . Smokeless tobacco: Never Used  Substance and Sexual Activity  . Alcohol use: No  . Drug use: Yes    Types: Marijuana  . Sexual activity: Never    Birth control/protection: Surgical  Other Topics Concern  . Not on file  Social History Narrative  . Not on file   Social Determinants of Health   Financial Resource Strain:   . Difficulty of Paying Living Expenses:   Food Insecurity:   . Worried About Charity fundraiser in the Last Year:   . Arboriculturist in the Last Year:   Transportation  Needs:   . Film/video editor (Medical):   Marland Kitchen Lack of Transportation (Non-Medical):   Physical Activity:   . Days of Exercise per Week:   . Minutes of Exercise per Session:   Stress:   . Feeling of Stress :   Social Connections:   . Frequency of Communication with Friends and Family:   . Frequency of Social Gatherings with Friends and Family:   . Attends Religious Services:   . Active Member of Clubs or Organizations:   . Attends Archivist Meetings:   Marland Kitchen Marital Status:   Intimate Partner Violence:   . Fear of Current or Ex-Partner:   . Emotionally Abused:   Marland Kitchen Physically Abused:   . Sexually Abused:      PHYSICAL EXAM:  VS: BP (!) 173/112   Pulse 83   Ht 5' (1.524 m)   Wt 180 lb (81.6 kg)   LMP 02/15/2020 Comment: started menses today  BMI 35.15 kg/m  Physical Exam Gen: NAD, alert, cooperative with exam, well-appearing MSK:  Left shoulder: Normal active abduction and flexion. Some limitations with complete external rotation. Normal strength resistance. Pain with empty can test. Positive O'Brien's test. Right hand: No signs of atrophy. No  pain with grind of the thumb. Normal grip strength. Negative Tinel's at the wrist. Neurovascularly intact  Limited ultrasound: Right wrist/hand:  Normal-appearing first dorsal compartment. No significant effusion noted in the wrist. No changes in the second carpometacarpal joint. Significant swelling and enlargement and flattening of the median nerve in the carpal tunnel on the right when compared to the left.  Summary: Findings would suggest carpal tunnel disease.  Ultrasound and interpretation by Clare Gandy, MD    ASSESSMENT & PLAN:   Carpal tunnel syndrome of right wrist There is significant enlargement of the median nerve in the right when compared to the left.  Is likely exacerbated with movement she is doing at work. -Splint. - Counseled on home exercise therapy and supportive care. -Could  consider injection or physical therapy. -Work note provided.  Capsulitis of left shoulder Pain is acutely worsened.  We have tried injection as well  home therapy.  Seems to be more of a labral issue for capsular issue. - Counseled on home exercise therapy and supportive care. -MRI to evaluate for capsulitis versus labral tear.

## 2020-02-27 NOTE — Assessment & Plan Note (Addendum)
Pain is acutely worsened.  We have tried injection as well  home therapy.  Seems to be more of a labral issue for capsular issue. - Counseled on home exercise therapy and supportive care. -MRI to evaluate for capsulitis versus labral tear.

## 2020-02-27 NOTE — Patient Instructions (Addendum)
Good to see you Please try the brace at work and at night for the wrist  Please try over the counter voltaren for the wrist  I will call with the xray results today  Please call  imaging to set up the MRi in a couple of days. [phone] Please send me a message in MyChart with any questions or updates.  Please see me back in 4 weeks for the wrist  We will set up a virtual visit after the MRI is resulted .   --Dr. Jordan Likes

## 2020-02-27 NOTE — Assessment & Plan Note (Addendum)
There is significant enlargement of the median nerve in the right when compared to the left.  Is likely exacerbated with movement she is doing at work. -Splint. - Counseled on home exercise therapy and supportive care. -Could consider injection or physical therapy. -Work note provided.

## 2020-03-26 ENCOUNTER — Ambulatory Visit: Payer: Medicaid Other | Admitting: Family Medicine

## 2020-03-26 NOTE — Progress Notes (Deleted)
  Janice French - 39 y.o. female MRN [id number]  Date of birth: [birth year]  SUBJECTIVE:  Including CC & ROS.  No chief complaint on file.   Janice French is a 39 y.o. female that is  ***.  ***   Review of Systems See HPI   HISTORY: Past Medical, Surgical, Social, and Family History Reviewed & Updated per EMR.   Pertinent Historical Findings include:  Past Medical History:  Diagnosis Date  . Dysmenorrhea   . Hypertension     Past Surgical History:  Procedure Laterality Date  . CHOLECYSTECTOMY    . TUBAL LIGATION      No family history on file.  Social History   Socioeconomic History  . Marital status: Single    Spouse name: Not on file  . Number of children: Not on file  . Years of education: Not on file  . Highest education level: Not on file  Occupational History  . Not on file  Tobacco Use  . Smoking status: Current Every Day Smoker    Types: Cigars  . Smokeless tobacco: Never Used  Substance and Sexual Activity  . Alcohol use: No  . Drug use: Yes    Types: Marijuana  . Sexual activity: Never    Birth control/protection: Surgical  Other Topics Concern  . Not on file  Social History Narrative  . Not on file   Social Determinants of Health   Financial Resource Strain:   . Difficulty of Paying Living Expenses:   Food Insecurity:   . Worried About Programme researcher, broadcasting/film/video in the Last Year:   . Barista in the Last Year:   Transportation Needs:   . Freight forwarder (Medical):   Marland Kitchen Lack of Transportation (Non-Medical):   Physical Activity:   . Days of Exercise per Week:   . Minutes of Exercise per Session:   Stress:   . Feeling of Stress :   Social Connections:   . Frequency of Communication with Friends and Family:   . Frequency of Social Gatherings with Friends and Family:   . Attends Religious Services:   . Active Member of Clubs or Organizations:   . Attends Banker Meetings:   Marland Kitchen Marital Status:   Intimate Partner  Violence:   . Fear of Current or Ex-Partner:   . Emotionally Abused:   Marland Kitchen Physically Abused:   . Sexually Abused:      PHYSICAL EXAM:  VS: There were no vitals taken for this visit. Physical Exam Gen: NAD, alert, cooperative with exam, well-appearing MSK:  ***      ASSESSMENT & PLAN:   No problem-specific Assessment & Plan notes found for this encounter.

## 2020-04-05 ENCOUNTER — Other Ambulatory Visit: Payer: Self-pay

## 2020-04-05 ENCOUNTER — Ambulatory Visit
Admission: RE | Admit: 2020-04-05 | Discharge: 2020-04-05 | Disposition: A | Discharge status: Home / Self Care | Payer: Medicaid Other | Source: Ambulatory Visit | Attending: Family Medicine | Admitting: Family Medicine

## 2020-04-05 DIAGNOSIS — M7502 Adhesive capsulitis of left shoulder: Secondary | ICD-10-CM | POA: Diagnosis not present

## 2020-04-05 DIAGNOSIS — M778 Other enthesopathies, not elsewhere classified: Secondary | ICD-10-CM

## 2020-04-08 ENCOUNTER — Other Ambulatory Visit: Payer: Self-pay

## 2020-04-08 ENCOUNTER — Telehealth (INDEPENDENT_AMBULATORY_CARE_PROVIDER_SITE_OTHER): Payer: Medicaid Other | Admitting: Family Medicine

## 2020-04-08 DIAGNOSIS — M778 Other enthesopathies, not elsewhere classified: Secondary | ICD-10-CM | POA: Diagnosis not present

## 2020-04-08 NOTE — Progress Notes (Signed)
Virtual Visit via Video Note  I connected with Kellyn Mccary on 04/08/20 at  1:30 PM EDT by a video enabled telemedicine application and verified that I am speaking with the correct person using two identifiers.   I discussed the limitations of evaluation and management by telemedicine and the availability of in person appointments. The patient expressed understanding and agreed to proceed.  History of Present Illness:  Ms. Dickenson is a 39 year old female that is following up after MRI of her left shoulder.  It was showing tendinosis of the biceps tendon as well as the supraspinatus.  She is still having more pain with abduction external rotation.  No pain with internal rotation.   Observations/Objective:  Gen: NAD, alert, cooperative with exam, well-appearing  Assessment and Plan:  Capsulitis of left shoulder: MRI was only showing tendinosis with no labral tear.  Her symptoms still do seem to be more consistent with a capsular issue. -Referral to physical therapy. -Could try an intra-articular Toradol injection.  Follow Up Instructions:    I discussed the assessment and treatment plan with the patient. The patient was provided an opportunity to ask questions and all were answered. The patient agreed with the plan and demonstrated an understanding of the instructions.   The patient was advised to call back or seek an in-person evaluation if the symptoms worsen or if the condition fails to improve as anticipated.     Clare Gandy, MD

## 2020-04-08 NOTE — Assessment & Plan Note (Signed)
MRI was only showing tendinosis with no labral tear.  Her symptoms still do seem to be more consistent with a capsular issue. -Referral to physical therapy. -Could try an intra-articular Toradol injection.

## 2020-04-25 ENCOUNTER — Ambulatory Visit: Payer: Medicaid Other | Admitting: Physical Therapy

## 2020-06-10 ENCOUNTER — Other Ambulatory Visit: Payer: Self-pay

## 2020-06-10 ENCOUNTER — Ambulatory Visit: Payer: Medicaid Other | Attending: Family Medicine | Admitting: Physical Therapy

## 2020-06-10 ENCOUNTER — Encounter: Payer: Self-pay | Admitting: Physical Therapy

## 2020-06-10 DIAGNOSIS — M25512 Pain in left shoulder: Secondary | ICD-10-CM | POA: Insufficient documentation

## 2020-06-10 DIAGNOSIS — G8929 Other chronic pain: Secondary | ICD-10-CM | POA: Diagnosis not present

## 2020-06-10 DIAGNOSIS — M25612 Stiffness of left shoulder, not elsewhere classified: Secondary | ICD-10-CM | POA: Diagnosis not present

## 2020-06-10 DIAGNOSIS — M6281 Muscle weakness (generalized): Secondary | ICD-10-CM

## 2020-06-10 DIAGNOSIS — M62838 Other muscle spasm: Secondary | ICD-10-CM

## 2020-06-10 NOTE — Therapy (Addendum)
[facility] [address]  [place], [place], [address] Phone: [phone]   Fax:  [phone]  Physical Therapy Evaluation  Patient Details  Name: Janice French MRN: [id number] Date of Birth: May 07, 1981 Referring Provider (PT): Clearance Coots, MD   Encounter Date: 06/10/2020   PT End of Session - 06/10/20 1113    Visit Number 1    Number of Visits 7    Date for PT Re-Evaluation 07/22/20    Authorization Type Medicaid [employer]    PT Start Time [phone]    PT Stop Time 0930   moist heat   PT Time Calculation (min) 41 min    Activity Tolerance Patient tolerated treatment well;Patient limited by pain    Behavior During Therapy [facility] for tasks assessed/performed           Past Medical History:  Diagnosis Date  . Dysmenorrhea   . Hypertension     Past Surgical History:  Procedure Laterality Date  . CHOLECYSTECTOMY    . TUBAL LIGATION      There were no vitals filed for this visit.    Subjective Assessment - 06/10/20 0851    Subjective Patient reports L shoulder pain since December 2020 when she was feeding a machine some boxes and her "arm went out like it didn't want to do anything." Since initial onset, it feels about the same. Pain occurs diffusely over the L shoulder joint with pain travelling up the neck. Intermittently has N/T in B hands. Worse with L sidelying, reaching behind, lifting overhead. Has not had benefit from ice or injection. For a couple weeks she has been having L sided chest pain with coughing or sneezing.    Pertinent History HTN    Limitations Lifting;House hold activities    Diagnostic tests 04/05/20 L shoulder MRI: Mild tendinosis of the supraspinatus tendon. Mild tendinosis of the intra-articular portion of the long head of the biceps tendon.    Patient Stated Goals get rid of pain    Currently in Pain? Yes    Pain Score 0-No pain    Pain Location Shoulder    Pain Orientation Left      Pain Descriptors / Indicators Aching;Sharp    Pain Type Chronic pain              [facility] PT Assessment - 06/10/20 0856      Assessment   Medical Diagnosis Capsulitis of L shoulder    Referring Provider (PT) Clearance Coots, MD    Onset Date/Surgical Date 10/24/19    Hand Dominance Right    Next MD Visit not scheduled    Prior Therapy no      Precautions   Precautions None      Balance Screen   Has the patient fallen in the past 6 months Yes    How many times? 1   fell in the bathtub- no injuries   Has the patient had a decrease in activity level because of a fear of falling?  No    Is the patient reluctant to leave their home because of a fear of falling?  No      Home Social worker Private residence    Living Arrangements Alone    Available Help at Discharge Family    Type of Home Apartment      Prior Function   Level of Independence Independent    Vocation Unemployed    Vocation Requirements reports she  was fired    Leisure none      Charity fundraiser Status Within Functional Limits for tasks assessed      Sensation   Light Touch Appears Intact      Coordination   Gross Motor Movements are Fluid and Coordinated Yes      Posture/Postural Control   Posture/Postural Control Postural limitations    Postural Limitations Rounded Shoulders      ROM / Strength   AROM / PROM / Strength AROM;Strength;PROM      AROM   AROM Assessment Site Shoulder    Right/Left Shoulder Right;Left    Right Shoulder Flexion 174 Degrees    Right Shoulder ABduction 180 Degrees    Right Shoulder Internal Rotation --   FIR T8   Right Shoulder External Rotation --   FER T5   Left Shoulder Flexion 135 Degrees   pain   Left Shoulder ABduction 95 Degrees   pain   Left Shoulder Internal Rotation --   FIR T9 & pain   Left Shoulder External Rotation --   FER T1 & pain     PROM   PROM Assessment Site Shoulder    Right/Left Shoulder Left    Left Shoulder  Flexion 140 Degrees   pain   Left Shoulder ABduction 144 Degrees   pain   Left Shoulder Internal Rotation 79 Degrees   pain   Left Shoulder External Rotation 60 Degrees   pain     Strength   Strength Assessment Site Shoulder    Right/Left Shoulder Right;Left    Right Shoulder Flexion 4+/5    Right Shoulder ABduction 4+/5    Right Shoulder Internal Rotation 4/5    Right Shoulder External Rotation 4+/5    Left Shoulder Flexion 4/5    Left Shoulder ABduction 4/5    Left Shoulder Internal Rotation 4-/5    Left Shoulder External Rotation 4-/5      Palpation   Palpation comment TTP and increased soft tissue restriction in L pec, UT, infraspinatus, and diffusely over glenohumeral jt                      Objective measurements completed on examination: See above findings.       [facility] Adult PT Treatment/Exercise - 06/10/20 0856      Modalities   Modalities Moist Heat      Moist Heat Therapy   Number Minutes Moist Heat 10 Minutes    Moist Heat Location Shoulder   L                 PT Education - 06/10/20 1113    Education Details prognosis, POC, HEP, edu on using moist heat for 10 minutes for muscle pain relief    Person(s) Educated Patient    Methods Explanation;Demonstration;Tactile cues;Verbal cues;Handout    Comprehension Verbalized understanding;Returned demonstration            PT Short Term Goals - 06/10/20 1121      PT SHORT TERM GOAL #1   Title Patient to be independent with initial HEP.    Time 3    Period Weeks    Status New    Target Date 07/01/20             PT Long Term Goals - 06/10/20 1121      PT LONG TERM GOAL #1   Title Patient to be independent with advanced HEP.    Time 6  Period Weeks    Status New    Target Date 07/22/20      PT LONG TERM GOAL #2   Title Patient to demonstrate L shoulder AROM/PROM [employer] and without pain limiting.    Time 6    Period Weeks    Status New    Target Date 07/22/20      PT LONG  TERM GOAL #3   Title Patient to demonstrate L shoulder strength >/=4+/5.    Time 6    Period Weeks    Status New    Target Date 07/22/20      PT LONG TERM GOAL #4   Title Patient to report ability to sleep in L sidelying for 4 hours without pain.    Time 6    Period Weeks    Status New    Target Date 07/22/20      PT LONG TERM GOAL #5   Title Patient to demonstrate ability to reach to overhead shelf with L UE with good scapular mechanics and no pain.    Time 6    Period Weeks    Status New    Target Date 07/22/20                  Plan - 06/10/20 1114    Clinical Impression Statement Patient is a 39y/o F presenting to [facility] with c/o chronic L shoulder pain since December 2020. 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 Pain now still unchanged, and occurs diffusely over the L shoulder and into UT. Worse with L sidelying, reaching behind, and lifting overhead. Reports symmetrical N/T in B hands but this does not seem to be radicular in nature. Patient also reports new onset of L sided chest pain for the past couple weeks, worse with coughing and sneezing. Patient today presenting with limited and painful L shoulder AROM and PROM, decreased L shoulder strength, rounded shoulders, and TTP and increased soft tissue restriction in L pec, UT, infraspinatus, and diffusely over glenohumeral joint. Able to reproduce chest pain today with palpation over L pec major. Patient was educated on gentle stretching and AAROM HEP- patient reported understanding. Ended session with moist heat to L shoulder for pain relief. Would benefit from skilled PT services 1x/week for 6 weeks to address aforementioned impairments.    Personal Factors and Comorbidities Age;Comorbidity 1;Fitness;Past/Current Experience;Time since onset of injury/illness/exacerbation    Comorbidities HTN    Examination-Activity Limitations Sleep;Bed Mobility;Carry;Dressing;Hygiene/Grooming;Lift;Reach Overhead      Examination-Participation Restrictions Cleaning;Shop;Driving;Yard Work;Interpersonal Relationship;Laundry;Meal Prep    Stability/Clinical Decision Making Stable/Uncomplicated    Clinical Decision Making Low    Rehab Potential Good    PT Frequency 1x / week    PT Duration 6 weeks    PT Treatment/Interventions ADLs/Self Care Home Management;Cryotherapy;Electrical Stimulation;Iontophoresis 53m/ml Dexamethasone;Moist Heat;Therapeutic exercise;Therapeutic activities;Functional mobility training;Ultrasound;Neuromuscular re-education;Patient/family education;Manual techniques;Vasopneumatic Device;Taping;Energy conservation;Dry needling;Passive range of motion    PT Next Visit Plan reassess HEP, progress L shoulder AAROM/PROM to tolerance, gentle shoulder strengthening    Consulted and Agree with Plan of Care Patient           Patient will benefit from skilled therapeutic intervention in order to improve the following deficits and impairments:  Increased edema, Decreased activity tolerance, Decreased strength, Increased fascial restricitons, Impaired UE functional use, Pain, Increased muscle spasms, Improper body mechanics, Decreased range of motion, Impaired flexibility, Postural dysfunction  Visit Diagnosis: Chronic left shoulder pain  Stiffness of left shoulder, not elsewhere classified  Muscle  weakness (generalized)  Other muscle spasm     Problem List Patient Active Problem List   Diagnosis Date Noted  . Carpal tunnel syndrome of right wrist 02/27/2020  . Capsulitis of left shoulder 11/28/2019  . Left elbow pain 11/28/2019  . Ganglion cyst of dorsum of left wrist 11/28/2019  . Essential hypertension 11/28/2019     Janene Harvey, PT, DPT 06/10/20 11:24 AM   [facility] [address]  [place], [place], [address] Phone: [phone]   Fax:  [phone]  Name: Janice French MRN: [id number] Date of  Birth: 05-Feb-1981   PHYSICAL THERAPY DISCHARGE SUMMARY  Visits from Start of Care: 1  Current functional level related to goals / functional outcomes: See above clinical impression; patient did not return   Remaining deficits: See above objective measures   Education / Equipment: HEP  Plan: Patient agrees to discharge.  Patient goals were not met. Patient is being discharged due to not returning since the last visit.  ?????     Janene Harvey, PT, DPT 07/23/20 10:29 AM

## 2020-06-17 ENCOUNTER — Ambulatory Visit: Payer: Medicaid Other | Admitting: Physical Therapy

## 2020-06-18 ENCOUNTER — Ambulatory Visit: Payer: Medicaid Other | Admitting: Physical Therapy

## 2020-06-24 ENCOUNTER — Ambulatory Visit: Payer: Medicaid Other | Attending: Family Medicine | Admitting: Physical Therapy

## 2020-06-26 DIAGNOSIS — R5381 Other malaise: Secondary | ICD-10-CM | POA: Diagnosis not present

## 2020-06-26 DIAGNOSIS — M25561 Pain in right knee: Secondary | ICD-10-CM | POA: Diagnosis not present

## 2020-06-26 DIAGNOSIS — R52 Pain, unspecified: Secondary | ICD-10-CM | POA: Diagnosis not present

## 2020-07-01 ENCOUNTER — Ambulatory Visit: Payer: Medicaid Other

## 2020-07-08 ENCOUNTER — Encounter: Payer: Medicaid Other | Admitting: Physical Therapy

## 2020-07-15 ENCOUNTER — Encounter: Payer: Medicaid Other | Admitting: Physical Therapy

## 2020-07-22 ENCOUNTER — Encounter: Payer: Medicaid Other | Admitting: Physical Therapy

## 2020-08-06 ENCOUNTER — Emergency Department (HOSPITAL_BASED_OUTPATIENT_CLINIC_OR_DEPARTMENT_OTHER)
Admission: EM | Admit: 2020-08-06 | Discharge: 2020-08-06 | Disposition: A | Discharge status: Home / Self Care | Payer: Medicaid Other | Attending: Emergency Medicine | Admitting: Emergency Medicine

## 2020-08-06 ENCOUNTER — Emergency Department (HOSPITAL_BASED_OUTPATIENT_CLINIC_OR_DEPARTMENT_OTHER): Payer: Medicaid Other

## 2020-08-06 ENCOUNTER — Encounter (HOSPITAL_BASED_OUTPATIENT_CLINIC_OR_DEPARTMENT_OTHER): Payer: Self-pay | Admitting: *Deleted

## 2020-08-06 ENCOUNTER — Other Ambulatory Visit: Payer: Self-pay

## 2020-08-06 DIAGNOSIS — M25522 Pain in left elbow: Secondary | ICD-10-CM | POA: Diagnosis not present

## 2020-08-06 DIAGNOSIS — X58XXXA Exposure to other specified factors, initial encounter: Secondary | ICD-10-CM | POA: Insufficient documentation

## 2020-08-06 DIAGNOSIS — F17290 Nicotine dependence, other tobacco product, uncomplicated: Secondary | ICD-10-CM | POA: Insufficient documentation

## 2020-08-06 DIAGNOSIS — I10 Essential (primary) hypertension: Secondary | ICD-10-CM | POA: Insufficient documentation

## 2020-08-06 DIAGNOSIS — M67432 Ganglion, left wrist: Secondary | ICD-10-CM | POA: Diagnosis not present

## 2020-08-06 DIAGNOSIS — M7502 Adhesive capsulitis of left shoulder: Secondary | ICD-10-CM | POA: Insufficient documentation

## 2020-08-06 DIAGNOSIS — S9032XA Contusion of left foot, initial encounter: Secondary | ICD-10-CM | POA: Diagnosis not present

## 2020-08-06 DIAGNOSIS — S90122A Contusion of left lesser toe(s) without damage to nail, initial encounter: Secondary | ICD-10-CM | POA: Insufficient documentation

## 2020-08-06 DIAGNOSIS — F17200 Nicotine dependence, unspecified, uncomplicated: Secondary | ICD-10-CM

## 2020-08-06 DIAGNOSIS — G5601 Carpal tunnel syndrome, right upper limb: Secondary | ICD-10-CM | POA: Diagnosis not present

## 2020-08-06 DIAGNOSIS — Z79899 Other long term (current) drug therapy: Secondary | ICD-10-CM | POA: Diagnosis not present

## 2020-08-06 DIAGNOSIS — S99922A Unspecified injury of left foot, initial encounter: Secondary | ICD-10-CM | POA: Diagnosis present

## 2020-08-06 DIAGNOSIS — M79672 Pain in left foot: Secondary | ICD-10-CM

## 2020-08-06 MED ORDER — AMLODIPINE BESYLATE 10 MG PO TABS: 10.0000 mg | ORAL_TABLET | Freq: Every day | ORAL | 0 refills | Status: DC

## 2020-08-06 MED ORDER — HYDROCODONE-ACETAMINOPHEN 5-325 MG PO TABS
1.0000 | ORAL_TABLET | Freq: Once | ORAL | Status: AC
  Administered 2020-08-06: 1 via ORAL
  Filled 2020-08-06: qty 1

## 2020-08-06 MED ORDER — CRUTCHES-ALUMINUM MISC: 1.0000 [IU] | Freq: Once | 0 refills | Status: AC

## 2020-08-06 MED ORDER — HYDROCODONE-ACETAMINOPHEN 5-325 MG PO TABS: 1.0000 | ORAL_TABLET | ORAL | 0 refills | Status: DC | PRN

## 2020-08-06 NOTE — ED Notes (Signed)
ED Provider at bedside. 

## 2020-08-06 NOTE — ED Triage Notes (Addendum)
C/o left foot and toe pain ,  Purple discoloration noted to great, 2nd, 4th and 5 toes, denies injury, pt also c/o h/a and increased bp

## 2020-08-06 NOTE — Discharge Instructions (Addendum)
It is critical to your health that you STOP smoking and take your blood pressure medication as prescribed.   Follow up with PCP and call vascular specialist to schedule an appointment as soon as possible to check blood flow in your feet/toes.   Take Norco as needed as prescribed for pain. This is only a temporary pain reliever. I suspect the pain in your toes is from poor blood flow to the toes.

## 2020-08-06 NOTE — ED Provider Notes (Signed)
[facility] EMERGENCY DEPARTMENT Provider Note   CSN: [id number] Arrival date & time: 08/06/20  1737     History Chief Complaint  Patient presents with  . Foot Pain    Janice French is a 39 y.o. female.  39 year old female with complaint of pain in her distal foot and toes for the past few days without injury, no history of similar pain previously. Patient is a smoker, history of HTN, does not take medications because she ran out of her BP meds. No other complaints today, specifically, no CP, visual disturbance, SHOB.        Past Medical History:  Diagnosis Date  . Dysmenorrhea   . Hypertension     Patient Active Problem List   Diagnosis Date Noted  . Carpal tunnel syndrome of right wrist 02/27/2020  . Capsulitis of left shoulder 11/28/2019  . Left elbow pain 11/28/2019  . Ganglion cyst of dorsum of left wrist 11/28/2019  . Essential hypertension 11/28/2019    Past Surgical History:  Procedure Laterality Date  . CHOLECYSTECTOMY    . TUBAL LIGATION       OB History   No obstetric history on file.     No family history on file.  Social History   Tobacco Use  . Smoking status: Current Every Day Smoker    Types: Cigars  . Smokeless tobacco: Never Used  Vaping Use  . Vaping Use: Never used  Substance Use Topics  . Alcohol use: No  . Drug use: Yes    Types: Marijuana    Home Medications Prior to Admission medications   Medication Sig Start Date End Date Taking? Authorizing Provider  amLODipine (NORVASC) 10 MG tablet Take 1 tablet (10 mg total) by mouth daily. 08/06/20   Jeannie Fend, PA-C  Capsaicin-Menthol-Methyl Sal (CAPSAICIN-METHYL SAL-MENTHOL) 0.025-1-12 % CREA Apply 1 application topically every 6 (six) hours as needed. Rubbed onto your abdomen every 6 hours if needed for pain and nausea 06/23/19   Arby Barrette, MD  HYDROcodone-acetaminophen (NORCO/VICODIN) 5-325 MG tablet Take 1 tablet by mouth every 4 (four) hours as needed.  08/06/20   Jeannie Fend, PA-C  Misc. Devices (CRUTCHES-ALUMINUM) MISC 1 Units by Does not apply route once for 1 dose. 08/06/20 08/06/20  Jeannie Fend, PA-C  promethazine (PHENERGAN) 25 MG tablet Take 1 tablet (25 mg total) by mouth every 6 (six) hours as needed for nausea or vomiting. 06/23/19 08/06/20  Arby Barrette, MD    Allergies    Patient has no known allergies.  Review of Systems   Review of Systems  Constitutional: Negative for fever.  Musculoskeletal: Positive for arthralgias, gait problem and myalgias.  Skin: Positive for color change. Negative for wound.  Allergic/Immunologic: Negative for immunocompromised state.  Neurological: Negative for weakness and numbness.  Psychiatric/Behavioral: Negative for confusion.  All other systems reviewed and are negative.   Physical Exam Updated Vital Signs BP (!) 193/108 (BP Location: Right Arm)   Pulse 80   Temp 98.1 F (36.7 C) (Oral)   Resp 18   Ht 5' (1.524 m)   Wt 81.6 kg   LMP 08/02/2020   SpO2 100%   BMI 35.15 kg/m   Physical Exam Vitals and nursing note reviewed.  Constitutional:      General: She is not in acute distress.    Appearance: She is well-developed. She is not diaphoretic.  HENT:     Head: Normocephalic and atraumatic.  Cardiovascular:     Pulses: Normal  pulses.  Pulmonary:     Effort: Pulmonary effort is normal.  Musculoskeletal:        General: Tenderness present. No swelling or deformity.     Comments: Pain to all toes EXCEPT for the 4th toe, most tender- 3rd digit which appears bruised, concerning for lack of circulation, will not allow for capillary refill check on this digit due to severe pain, toes are not cold to the touch. DP pulse present by doppler.   Skin:    General: Skin is warm and dry.     Findings: Bruising present.  Neurological:     Mental Status: She is alert and oriented to person, place, and time.  Psychiatric:        Behavior: Behavior normal.     ED Results /  Procedures / Treatments   Labs (all labs ordered are listed, but only abnormal results are displayed) Labs Reviewed - No data to display  EKG None  Radiology DG Foot Complete Left  Result Date: 08/06/2020 CLINICAL DATA:  Pain and bruising EXAM: LEFT FOOT - COMPLETE 3+ VIEW COMPARISON:  June 10, 2018 FINDINGS: Frontal, oblique, and lateral views obtained. There is no fracture or dislocation. Joint spaces appear normal. No erosive change. IMPRESSION: No fracture or dislocation.  No evident arthropathy. Electronically Signed   By: Bretta Bang III M.D.   On: 08/06/2020 21:08    Procedures Procedures (including critical care time)  Medications Ordered in ED Medications  HYDROcodone-acetaminophen (NORCO/VICODIN) 5-325 MG per tablet 1 tablet (1 tablet Oral Given 08/06/20 2129)    ED Course  I have reviewed the triage vital signs and the nursing notes.  Pertinent labs & imaging results that were available during my care of the patient were reviewed by me and considered in my medical decision making (see chart for details).  Clinical Course as of Aug 06 2301  Tue Aug 06, 2020  2301 39yo female with pain in left toes, exam concerning for diminished blood flow to the toes, DP pulse is present with doppler. Discussed with Dr. Donnald Garre, will refer to vascular for follow up. Will also refill BP meds, advised patient she may loose the toes and follow up is critical. Also stressed the importance of stopping smoking and BP med compliance to prevent further loss of digits, kidney damage, long term health consequences.    [LM]    Clinical Course User Index [LM] Alden Hipp   MDM Rules/Calculators/A&P                          Final Clinical Impression(s) / ED Diagnoses Final diagnoses:  Foot pain, left  Hypertension, unspecified type  Tobacco use disorder    Rx / DC Orders ED Discharge Orders         Ordered    amLODipine (NORVASC) 10 MG tablet  Daily        08/06/20 2131      HYDROcodone-acetaminophen (NORCO/VICODIN) 5-325 MG tablet  Every 4 hours PRN        08/06/20 2131    Misc. Devices (CRUTCHES-ALUMINUM) MISC   Once        08/06/20 2152           Alden Hipp 08/06/20 2302    Arby Barrette, MD 08/07/20 [phone]

## 2020-08-09 ENCOUNTER — Ambulatory Visit (HOSPITAL_COMMUNITY)
Admission: RE | Admit: 2020-08-09 | Discharge: 2020-08-09 | Disposition: A | Discharge status: Home / Self Care | Payer: Medicaid Other | Source: Ambulatory Visit | Attending: Vascular Surgery | Admitting: Vascular Surgery

## 2020-08-09 ENCOUNTER — Encounter: Payer: Self-pay | Admitting: Vascular Surgery

## 2020-08-09 ENCOUNTER — Encounter: Payer: Self-pay | Admitting: *Deleted

## 2020-08-09 ENCOUNTER — Other Ambulatory Visit: Payer: Self-pay

## 2020-08-09 ENCOUNTER — Other Ambulatory Visit (HOSPITAL_COMMUNITY): Payer: Self-pay | Admitting: Vascular Surgery

## 2020-08-09 ENCOUNTER — Ambulatory Visit (INDEPENDENT_AMBULATORY_CARE_PROVIDER_SITE_OTHER): Payer: Medicaid Other | Admitting: Vascular Surgery

## 2020-08-09 VITALS — BP 138/90 | HR 80 | Temp 98.6°F | Resp 20 | Ht 60.0 in | Wt 180.0 lb

## 2020-08-09 DIAGNOSIS — I739 Peripheral vascular disease, unspecified: Secondary | ICD-10-CM | POA: Diagnosis not present

## 2020-08-09 DIAGNOSIS — R0989 Other specified symptoms and signs involving the circulatory and respiratory systems: Secondary | ICD-10-CM | POA: Diagnosis not present

## 2020-08-09 MED ORDER — OXYCODONE-ACETAMINOPHEN 5-325 MG PO TABS: 1.0000 | ORAL_TABLET | ORAL | 0 refills | Status: DC | PRN

## 2020-08-09 NOTE — Progress Notes (Signed)
Patient ID: Janice French, female   DOB: Aug 15, 1981, 39 y.o.   MRN: [id number]  Reason for Consult: New Patient (Initial Visit)   Referred by No ref. provider found  Subjective:     HPI:  Janice French is a 39 y.o. female history of hypertension recently presented to the emergency department with new onset foot pain.  She states she has had this pain for several weeks now but it did worsen on that day.  She was smokes 2 black in miles daily.  She is a former cigarette smoker.  She also has hypertension has been uncontrolled in the past but currently is better controlled.  She does not take any aspirin or blood thinners.  She has never had lower extremity intervention.  She states she has pain in her foot particularly with walking.  She has very short distance calf and thigh cramping states that she could not walk out to the parking lot.  She also has toe discoloration on the left.  Past Medical History:  Diagnosis Date  . Dysmenorrhea   . Hypertension    History reviewed. No pertinent family history. Past Surgical History:  Procedure Laterality Date  . CHOLECYSTECTOMY    . TUBAL LIGATION      Short Social History:  Social History   Tobacco Use  . Smoking status: Current Every Day Smoker    Types: Cigars  . Smokeless tobacco: Never Used  Substance Use Topics  . Alcohol use: No    No Known Allergies  Current Outpatient Medications  Medication Sig Dispense Refill  . amLODipine (NORVASC) 10 MG tablet Take 1 tablet (10 mg total) by mouth daily. 30 tablet 0  . Capsaicin-Menthol-Methyl Sal (CAPSAICIN-METHYL SAL-MENTHOL) 0.025-1-12 % CREA Apply 1 application topically every 6 (six) hours as needed. Rubbed onto your abdomen every 6 hours if needed for pain and nausea (Patient not taking: Reported on 08/09/2020) 56.6 g 1  . HYDROcodone-acetaminophen (NORCO/VICODIN) 5-325 MG tablet Take 1 tablet by mouth every 4 (four) hours as needed. (Patient not taking: Reported on 08/09/2020) 10  tablet 0   No current facility-administered medications for this visit.    Review of Systems  Constitutional:  Constitutional negative. HENT: HENT negative.  Eyes: Eyes negative.  Respiratory: Respiratory negative.  Cardiovascular: Positive for claudication.  GI: Gastrointestinal negative.  Musculoskeletal: Positive for gait problem and leg pain.  Skin: Skin negative.  Hematologic: Hematologic/lymphatic negative.  Psychiatric: Psychiatric negative.        Objective:  Objective  Vitals:   08/09/20 0938  BP: 138/90  Pulse: 80  Resp: 20  Temp: 98.6 F (37 C)  SpO2: 98%    Physical Exam Constitutional:      Appearance: She is normal weight.  HENT:     Head: Normocephalic.     Nose:     Comments: Wearing a mask Neck:     Vascular: No carotid bruit.  Cardiovascular:     Pulses:          Radial pulses are 2+ on the right side and 2+ on the left side.       Femoral pulses are 2+ on the right side and 1+ on the left side.      Popliteal pulses are 2+ on the right side and 0 on the left side.       Dorsalis pedis pulses are 2+ on the right side and detected w/ Doppler on the left side.  Pulmonary:     Effort: Pulmonary effort is  normal.     Breath sounds: Normal breath sounds.  Abdominal:     General: Abdomen is flat.     Palpations: Abdomen is soft. There is no mass.  Musculoskeletal:     Cervical back: Neck supple.  Skin:    Comments: There is dark coloration tips of the left second third toes  Neurological:     General: No focal deficit present.     Mental Status: She is alert.  Psychiatric:        Mood and Affect: Mood normal.        Behavior: Behavior normal.        Thought Content: Thought content normal.        Judgment: Judgment normal.     Data: I have independently interpreted her ABIs to be 0.88 on the right with toe pressure 104 and biphasic on the left 0.71 with toe pressure 77 and monophasic.     Assessment/Plan:     39 year old female with  short distance claudication on the left.  She also has some right lower extremity pain although her ABIs are fairly preserved on the right and her right dorsalis pedis pulses palpable.  She has no palpable pulses on the left side she also has discoloration of 2 of her toes.  This is concerning for critical limb ischemia.  We will begin with angiography from right common femoral approach to consider left lower extremity intervention.  I discussed with her the urgent need for smoking cessation completely and also to take baby aspirin daily.  We will get her set up for angiography in the near future.  I have given her 30 Percocet sent to her [facility] this is the only pain prescription I will give her at this time.     Maeola Harman MD [facility]

## 2020-08-09 NOTE — H&P (View-Only) (Signed)
Patient ID: Janice French, female   DOB: Aug 15, 1981, 39 y.o.   MRN: [id number]  Reason for Consult: New Patient (Initial Visit)   Referred by No ref. provider found  Subjective:     HPI:  Janice French is a 39 y.o. female history of hypertension recently presented to the emergency department with new onset foot pain.  She states she has had this pain for several weeks now but it did worsen on that day.  She was smokes 2 black in miles daily.  She is a former cigarette smoker.  She also has hypertension has been uncontrolled in the past but currently is better controlled.  She does not take any aspirin or blood thinners.  She has never had lower extremity intervention.  She states she has pain in her foot particularly with walking.  She has very short distance calf and thigh cramping states that she could not walk out to the parking lot.  She also has toe discoloration on the left.  Past Medical History:  Diagnosis Date  . Dysmenorrhea   . Hypertension    History reviewed. No pertinent family history. Past Surgical History:  Procedure Laterality Date  . CHOLECYSTECTOMY    . TUBAL LIGATION      Short Social History:  Social History   Tobacco Use  . Smoking status: Current Every Day Smoker    Types: Cigars  . Smokeless tobacco: Never Used  Substance Use Topics  . Alcohol use: No    No Known Allergies  Current Outpatient Medications  Medication Sig Dispense Refill  . amLODipine (NORVASC) 10 MG tablet Take 1 tablet (10 mg total) by mouth daily. 30 tablet 0  . Capsaicin-Menthol-Methyl Sal (CAPSAICIN-METHYL SAL-MENTHOL) 0.025-1-12 % CREA Apply 1 application topically every 6 (six) hours as needed. Rubbed onto your abdomen every 6 hours if needed for pain and nausea (Patient not taking: Reported on 08/09/2020) 56.6 g 1  . HYDROcodone-acetaminophen (NORCO/VICODIN) 5-325 MG tablet Take 1 tablet by mouth every 4 (four) hours as needed. (Patient not taking: Reported on 08/09/2020) 10  tablet 0   No current facility-administered medications for this visit.    Review of Systems  Constitutional:  Constitutional negative. HENT: HENT negative.  Eyes: Eyes negative.  Respiratory: Respiratory negative.  Cardiovascular: Positive for claudication.  GI: Gastrointestinal negative.  Musculoskeletal: Positive for gait problem and leg pain.  Skin: Skin negative.  Hematologic: Hematologic/lymphatic negative.  Psychiatric: Psychiatric negative.        Objective:  Objective  Vitals:   08/09/20 0938  BP: 138/90  Pulse: 80  Resp: 20  Temp: 98.6 F (37 C)  SpO2: 98%    Physical Exam Constitutional:      Appearance: She is normal weight.  HENT:     Head: Normocephalic.     Nose:     Comments: Wearing a mask Neck:     Vascular: No carotid bruit.  Cardiovascular:     Pulses:          Radial pulses are 2+ on the right side and 2+ on the left side.       Femoral pulses are 2+ on the right side and 1+ on the left side.      Popliteal pulses are 2+ on the right side and 0 on the left side.       Dorsalis pedis pulses are 2+ on the right side and detected w/ Doppler on the left side.  Pulmonary:     Effort: Pulmonary effort is  normal.     Breath sounds: Normal breath sounds.  Abdominal:     General: Abdomen is flat.     Palpations: Abdomen is soft. There is no mass.  Musculoskeletal:     Cervical back: Neck supple.  Skin:    Comments: There is dark coloration tips of the left second third toes  Neurological:     General: No focal deficit present.     Mental Status: She is alert.  Psychiatric:        Mood and Affect: Mood normal.        Behavior: Behavior normal.        Thought Content: Thought content normal.        Judgment: Judgment normal.     Data: I have independently interpreted her ABIs to be 0.88 on the right with toe pressure 104 and biphasic on the left 0.71 with toe pressure 77 and monophasic.     Assessment/Plan:     39-year-old female with  short distance claudication on the left.  She also has some right lower extremity pain although her ABIs are fairly preserved on the right and her right dorsalis pedis pulses palpable.  She has no palpable pulses on the left side she also has discoloration of 2 of her toes.  This is concerning for critical limb ischemia.  We will begin with angiography from right common femoral approach to consider left lower extremity intervention.  I discussed with her the urgent need for smoking cessation completely and also to take baby aspirin daily.  We will get her set up for angiography in the near future.  I have given her 30 Percocet sent to her [facility] this is the only pain prescription I will give her at this time.     Dereon Williamsen Christopher Lawarence Meek MD [facility]  

## 2020-08-15 ENCOUNTER — Other Ambulatory Visit (HOSPITAL_COMMUNITY): Payer: Medicaid Other

## 2020-08-16 ENCOUNTER — Other Ambulatory Visit (HOSPITAL_COMMUNITY): Admission: RE | Admit: 2020-08-16 | Payer: Medicaid Other | Source: Ambulatory Visit

## 2020-08-16 DIAGNOSIS — S90122A Contusion of left lesser toe(s) without damage to nail, initial encounter: Secondary | ICD-10-CM | POA: Diagnosis not present

## 2020-08-16 DIAGNOSIS — Y999 Unspecified external cause status: Secondary | ICD-10-CM | POA: Diagnosis not present

## 2020-08-16 DIAGNOSIS — W228XXA Striking against or struck by other objects, initial encounter: Secondary | ICD-10-CM | POA: Diagnosis not present

## 2020-08-17 ENCOUNTER — Other Ambulatory Visit (HOSPITAL_COMMUNITY)
Admission: RE | Admit: 2020-08-17 | Discharge: 2020-08-17 | Disposition: A | Discharge status: Home / Self Care | Payer: Medicaid Other | Source: Ambulatory Visit | Attending: Vascular Surgery | Admitting: Vascular Surgery

## 2020-08-17 DIAGNOSIS — Z01812 Encounter for preprocedural laboratory examination: Secondary | ICD-10-CM | POA: Diagnosis not present

## 2020-08-17 DIAGNOSIS — Z20822 Contact with and (suspected) exposure to covid-19: Secondary | ICD-10-CM | POA: Insufficient documentation

## 2020-08-17 LAB — SARS CORONAVIRUS 2 (TAT 6-24 HRS): SARS Coronavirus 2: NEGATIVE

## 2020-08-19 ENCOUNTER — Ambulatory Visit (HOSPITAL_COMMUNITY)
Admission: RE | Admit: 2020-08-19 | Discharge: 2020-08-19 | Disposition: A | Discharge status: Home / Self Care | Payer: Medicaid Other | Source: Ambulatory Visit | Attending: Vascular Surgery | Admitting: Vascular Surgery

## 2020-08-19 ENCOUNTER — Other Ambulatory Visit: Payer: Self-pay | Admitting: Physician Assistant

## 2020-08-19 ENCOUNTER — Encounter (HOSPITAL_COMMUNITY)
Admission: RE | Disposition: A | Discharge status: Home / Self Care | Payer: Self-pay | Source: Ambulatory Visit | Attending: Vascular Surgery

## 2020-08-19 ENCOUNTER — Encounter (HOSPITAL_COMMUNITY): Payer: Self-pay | Admitting: Vascular Surgery

## 2020-08-19 DIAGNOSIS — I10 Essential (primary) hypertension: Secondary | ICD-10-CM | POA: Insufficient documentation

## 2020-08-19 DIAGNOSIS — I70222 Atherosclerosis of native arteries of extremities with rest pain, left leg: Secondary | ICD-10-CM | POA: Insufficient documentation

## 2020-08-19 DIAGNOSIS — F17290 Nicotine dependence, other tobacco product, uncomplicated: Secondary | ICD-10-CM | POA: Insufficient documentation

## 2020-08-19 DIAGNOSIS — Z79899 Other long term (current) drug therapy: Secondary | ICD-10-CM | POA: Diagnosis not present

## 2020-08-19 DIAGNOSIS — I739 Peripheral vascular disease, unspecified: Secondary | ICD-10-CM

## 2020-08-19 HISTORY — PX: PERIPHERAL VASCULAR INTERVENTION: CATH118257

## 2020-08-19 HISTORY — PX: ABDOMINAL AORTOGRAM W/LOWER EXTREMITY: CATH118223

## 2020-08-19 LAB — POCT I-STAT, CHEM 8
BUN: 7 mg/dL (ref 6–20)
Calcium, Ion: 1.26 mmol/L (ref 1.15–1.40)
Chloride: 103 mmol/L (ref 98–111)
Creatinine, Ser: 0.6 mg/dL (ref 0.44–1.00)
Glucose, Bld: 110 mg/dL — ABNORMAL HIGH (ref 70–99)
HCT: 41 % (ref 36.0–46.0)
Hemoglobin: 13.9 g/dL (ref 12.0–15.0)
Potassium: 3.7 mmol/L (ref 3.5–5.1)
Sodium: 140 mmol/L (ref 135–145)
TCO2: 25 mmol/L (ref 22–32)

## 2020-08-19 LAB — PREGNANCY, URINE: Preg Test, Ur: NEGATIVE

## 2020-08-19 SURGERY — ABDOMINAL AORTOGRAM W/LOWER EXTREMITY
Anesthesia: LOCAL | Laterality: Left

## 2020-08-19 MED ORDER — CLOPIDOGREL BISULFATE 300 MG PO TABS
ORAL_TABLET | ORAL | Status: DC | PRN
  Administered 2020-08-19: 300 mg via ORAL

## 2020-08-19 MED ORDER — IODIXANOL 320 MG/ML IV SOLN
INTRAVENOUS | Status: DC | PRN
  Administered 2020-08-19: 190 mL via INTRA_ARTERIAL

## 2020-08-19 MED ORDER — HEPARIN (PORCINE) IN NACL 1000-0.9 UT/500ML-% IV SOLN
INTRAVENOUS | Status: AC
  Filled 2020-08-19: qty 1000

## 2020-08-19 MED ORDER — FENTANYL CITRATE (PF) 100 MCG/2ML IJ SOLN
INTRAMUSCULAR | Status: AC
Start: 2020-08-19 — End: ?
  Filled 2020-08-19: qty 2

## 2020-08-19 MED ORDER — ONDANSETRON HCL 4 MG/2ML IJ SOLN: 4.0000 mg | Freq: Four times a day (QID) | INTRAMUSCULAR | Status: DC | PRN

## 2020-08-19 MED ORDER — CLOPIDOGREL BISULFATE 75 MG PO TABS: 300.0000 mg | ORAL_TABLET | Freq: Once | ORAL | Status: DC

## 2020-08-19 MED ORDER — ASPIRIN EC 81 MG PO TBEC: 81.0000 mg | DELAYED_RELEASE_TABLET | Freq: Every day | ORAL | Status: DC

## 2020-08-19 MED ORDER — LIDOCAINE HCL (PF) 1 % IJ SOLN
INTRAMUSCULAR | Status: AC
  Filled 2020-08-19: qty 30

## 2020-08-19 MED ORDER — OXYCODONE-ACETAMINOPHEN 5-325 MG PO TABS
1.0000 | ORAL_TABLET | Freq: Four times a day (QID) | ORAL | 0 refills | Status: DC | PRN
Start: 2020-08-19 — End: 2021-02-23

## 2020-08-19 MED ORDER — MIDAZOLAM HCL 2 MG/2ML IJ SOLN
INTRAMUSCULAR | Status: DC | PRN
  Administered 2020-08-19 (×2): 1 mg via INTRAVENOUS

## 2020-08-19 MED ORDER — CLOPIDOGREL BISULFATE 75 MG PO TABS: 75.0000 mg | ORAL_TABLET | Freq: Every day | ORAL | 11 refills | Status: DC

## 2020-08-19 MED ORDER — SODIUM CHLORIDE 0.9% FLUSH: 3.0000 mL | INTRAVENOUS | Status: DC | PRN

## 2020-08-19 MED ORDER — SODIUM CHLORIDE 0.9 % IV SOLN: INTRAVENOUS | Status: DC

## 2020-08-19 MED ORDER — ROSUVASTATIN CALCIUM 10 MG PO TABS: 10.0000 mg | ORAL_TABLET | Freq: Every day | ORAL | Status: DC

## 2020-08-19 MED ORDER — ACETAMINOPHEN 325 MG PO TABS: 650.0000 mg | ORAL_TABLET | ORAL | Status: DC | PRN

## 2020-08-19 MED ORDER — OXYCODONE HCL 5 MG PO TABS: 5.0000 mg | ORAL_TABLET | ORAL | Status: DC | PRN

## 2020-08-19 MED ORDER — SODIUM CHLORIDE 0.9 % WEIGHT BASED INFUSION: 1.0000 mL/kg/h | INTRAVENOUS | Status: DC

## 2020-08-19 MED ORDER — ROSUVASTATIN CALCIUM 10 MG PO TABS: 10.0000 mg | ORAL_TABLET | Freq: Every day | ORAL | 11 refills | Status: DC

## 2020-08-19 MED ORDER — LIDOCAINE HCL (PF) 1 % IJ SOLN
INTRAMUSCULAR | Status: DC | PRN
  Administered 2020-08-19: 18 mL via INTRADERMAL

## 2020-08-19 MED ORDER — HYDRALAZINE HCL 20 MG/ML IJ SOLN: 5.0000 mg | INTRAMUSCULAR | Status: DC | PRN

## 2020-08-19 MED ORDER — FENTANYL CITRATE (PF) 100 MCG/2ML IJ SOLN
INTRAMUSCULAR | Status: DC | PRN
Start: 2020-08-19 — End: 2020-08-19
  Administered 2020-08-19 (×2): 50 ug via INTRAVENOUS

## 2020-08-19 MED ORDER — SODIUM CHLORIDE 0.9 % IV SOLN: 250.0000 mL | INTRAVENOUS | Status: DC | PRN

## 2020-08-19 MED ORDER — SODIUM CHLORIDE 0.9% FLUSH: 3.0000 mL | Freq: Two times a day (BID) | INTRAVENOUS | Status: DC

## 2020-08-19 MED ORDER — MORPHINE SULFATE (PF) 2 MG/ML IV SOLN: 2.0000 mg | INTRAVENOUS | Status: DC | PRN

## 2020-08-19 MED ORDER — LABETALOL HCL 5 MG/ML IV SOLN: 10.0000 mg | INTRAVENOUS | Status: DC | PRN

## 2020-08-19 MED ORDER — MORPHINE SULFATE (PF) 2 MG/ML IV SOLN
INTRAVENOUS | Status: AC
  Filled 2020-08-19: qty 1

## 2020-08-19 MED ORDER — MIDAZOLAM HCL 2 MG/2ML IJ SOLN
INTRAMUSCULAR | Status: AC
  Filled 2020-08-19: qty 2

## 2020-08-19 MED ORDER — HEPARIN (PORCINE) IN NACL 1000-0.9 UT/500ML-% IV SOLN
INTRAVENOUS | Status: DC | PRN
  Administered 2020-08-19 (×2): 500 mL

## 2020-08-19 MED ORDER — HEPARIN SODIUM (PORCINE) 1000 UNIT/ML IJ SOLN
INTRAMUSCULAR | Status: DC | PRN
  Administered 2020-08-19: 8000 [IU] via INTRAVENOUS

## 2020-08-19 MED ORDER — CLOPIDOGREL BISULFATE 75 MG PO TABS: 75.0000 mg | ORAL_TABLET | Freq: Every day | ORAL | Status: DC

## 2020-08-19 MED ORDER — MORPHINE SULFATE (PF) 2 MG/ML IV SOLN
2.0000 mg | Freq: Once | INTRAVENOUS | Status: AC
  Administered 2020-08-19: 2 mg via INTRAVENOUS

## 2020-08-19 MED ORDER — HEPARIN SODIUM (PORCINE) 1000 UNIT/ML IJ SOLN
INTRAMUSCULAR | Status: AC
  Filled 2020-08-19: qty 1

## 2020-08-19 MED ORDER — CLOPIDOGREL BISULFATE 300 MG PO TABS
ORAL_TABLET | ORAL | Status: AC
  Filled 2020-08-19: qty 1

## 2020-08-19 SURGICAL SUPPLY — 21 items
BALLN MUSTANG 7X60X135 (BALLOONS) ×3
BALLOON MUSTANG 7X60X135 (BALLOONS) ×2 IMPLANT
CATH OMNI FLUSH 5F 65CM (CATHETERS) ×3 IMPLANT
CATH STRAIGHT 5FR 65CM (CATHETERS) ×3 IMPLANT
CLOSURE MYNX CONTROL 6F/7F (Vascular Products) ×3 IMPLANT
GLIDEWIRE ADV .035X260CM (WIRE) ×3 IMPLANT
KIT ENCORE 26 ADVANTAGE (KITS) ×3 IMPLANT
KIT MICROPUNCTURE NIT STIFF (SHEATH) ×3 IMPLANT
KIT PV (KITS) ×3 IMPLANT
SHEATH FLEX ANSEL ANG 6F 45CM (SHEATH) ×3 IMPLANT
SHEATH PINNACLE 5F 10CM (SHEATH) ×3 IMPLANT
SHEATH PINNACLE 6F 10CM (SHEATH) ×3 IMPLANT
SHEATH PROBE COVER 6X72 (BAG) ×3 IMPLANT
STENT ELUVIA 7X60X130 (Permanent Stent) ×3 IMPLANT
STOPCOCK MORSE 400PSI 3WAY (MISCELLANEOUS) ×3 IMPLANT
SYR MEDRAD MARK V 150ML (SYRINGE) ×3 IMPLANT
TRANSDUCER W/STOPCOCK (MISCELLANEOUS) ×3 IMPLANT
TRAY PV CATH (CUSTOM PROCEDURE TRAY) ×3 IMPLANT
TUBING CIL FLEX 10 FLL-RA (TUBING) ×3 IMPLANT
WIRE SPARTACORE .014X190CM (WIRE) ×3 IMPLANT
WIRE STARTER BENTSON 035X150 (WIRE) ×3 IMPLANT

## 2020-08-19 NOTE — Discharge Instructions (Signed)
Femoral Site Care This sheet gives you information about how to care for yourself after your procedure. Your health care provider may also give you more specific instructions. If you have problems or questions, contact your health care provider. What can I expect after the procedure? After the procedure, it is common to have:  Bruising that usually fades within 1-2 weeks.  Tenderness at the site. Follow these instructions at home: Wound care  Follow instructions from your health care provider about how to take care of your insertion site. Make sure you: ? Wash your hands with soap and water before you change your bandage (dressing). If soap and water are not available, use hand sanitizer. ? Change your dressing as told by your health care provider. ? Leave stitches (sutures), skin glue, or adhesive strips in place. These skin closures may need to stay in place for 2 weeks or longer. If adhesive strip edges start to loosen and curl up, you may trim the loose edges. Do not remove adhesive strips completely unless your health care provider tells you to do that.  Do not take baths, swim, or use a hot tub until your health care provider approves.  You may shower 24-48 hours after the procedure or as told by your health care provider. ? Gently wash the site with plain soap and water. ? Pat the area dry with a clean towel. ? Do not rub the site. This may cause bleeding.  Do not apply powder or lotion to the site. Keep the site clean and dry.  Check your femoral site every day for signs of infection. Check for: ? Redness, swelling, or pain. ? Fluid or blood. ? Warmth. ? Pus or a bad smell. Activity  For the first 2-3 days after your procedure, or as long as directed: ? Avoid climbing stairs as much as possible. ? Do not squat.  Do not lift anything that is heavier than 10 lb (4.5 kg), or the limit that you are told, until your health care provider says that it is safe.  Rest as  directed. ? Avoid sitting for a long time without moving. Get up to take short walks every 1-2 hours.  Do not drive for 24 hours if you were given a medicine to help you relax (sedative). General instructions  Take over-the-counter and prescription medicines only as told by your health care provider.  Keep all follow-up visits as told by your health care provider. This is important. Contact a health care provider if you have:  A fever or chills.  You have redness, swelling, or pain around your insertion site. Get help right away if:  The catheter insertion area swells very fast.  You pass out.  You suddenly start to sweat or your skin gets clammy.  The catheter insertion area is bleeding, and the bleeding does not stop when you hold steady pressure on the area.  The area near or just beyond the catheter insertion site becomes pale, cool, tingly, or numb. These symptoms may represent a serious problem that is an emergency. Do not wait to see if the symptoms will go away. Get medical help right away. Call your local emergency services (911 in the [place] Do not drive yourself to the hospital. Summary  After the procedure, it is common to have bruising that usually fades within 1-2 weeks.  Check your femoral site every day for signs of infection.  Do not lift anything that is heavier than 10 lb (4.5 kg), or the   limit that you are told, until your health care provider says that it is safe. This information is not intended to replace advice given to you by your health care provider. Make sure you discuss any questions you have with your health care provider. Document Revised: 11/22/2017 Document Reviewed: 11/22/2017 Elsevier Patient Education  2020 [employer]vier Inc[employer]  

## 2020-08-19 NOTE — Op Note (Signed)
    Patient name: Janice French MRN: [id number] DOB: 15-Aug-1981 Sex: female  08/19/2020 Pre-operative Diagnosis: Critical left lower extremity ischemia with rest pain Post-operative diagnosis:  Same Surgeon:  Luanna Salk. Randie Heinz, MD Procedure Performed: 1.  Ultrasound-guided cannulation right common femoral artery 2.  Aortogram with bilateral lower extremity runoff 3.  Stent of the left common into the external iliac arteries with 7 x 60 mm elluvia 4.  Moderate sedation with fentanyl and Versed for 41 minutes 5.  Mynx device closure right common femoral  Indications: 39 year old female with history of left lower extremity critical limb ischemia with rest pain.  She does have some early ischemic changes of her toes she is indicated for angiography possible intervention.  Findings: The aorta was patent.  Right common external leg arteries are patent without flow-limiting stenosis.  Left side appears to have a subtotal occlusion of the common iliac extending into the external leg artery at the level of the hypogastric.  The hypogastric artery is occluded on the left is patent on the right.  She has inline flow via common femoral, SFA, popliteal, 3 vessels bilaterally.  Prior to intervention on the left there is a gradient of greater than 70 mmHg after intervention there is no residual stenosis or dissection in 0 gradient.  Posterior tibial and dorsalis pedis pulses were palpable on the left at completion.   Procedure:  The patient was identified in the holding area and taken to room 8.  The patient was then placed supine on the table and prepped and draped in the usual sterile fashion.  A time out was called.  Ultrasound was used to evaluate the right common femoral artery which did have some calcium but overall was soft on the anterior wall.  The area was anesthetized 1% lidocaine cannulated with micropuncture needle followed by wire sheath.  A Bentson wire was placed followed by 5 [place] sheath.   Images saved the permanent record.  Omni catheter was placed to the level of L1 aortogram was performed followed by bilateral lower extremity runoff.  We did identify what appeared to be subtotal occlusion of the left common leading into the left external leg arteries.  We then crossed the lesion with Glidewire advantage placed a straight catheter.  We performed pullback gradient which greater than 70 mmHg.  We then placed a long sheath into the left external iliac artery from the right.  Patient was fully heparinized.  We primarily stented with drug-eluting stent.  This was postdilated with 7 mm balloon.  Completion demonstrated no residual gradient no dissection no stenosis by angiography.  Satisfied we exchanged for short 6 [place] sheath for minx device.  She tolerated procedure without any complication.  There is a palpable dorsalis pedis and posterior tibial pulses at completion.   Contrast: 190 cc  Marifer Hurd C. Randie Heinz, MD [facility] Office: [phone] Pager: [phone]

## 2020-08-19 NOTE — Progress Notes (Signed)
Patient was given discharge instructions. She verbalized understanding. 

## 2020-08-19 NOTE — Interval H&P Note (Signed)
History and Physical Interval Note:  08/19/2020 8:26 AM  Janice French  has presented today for surgery, with the diagnosis of PAD.  The various methods of treatment have been discussed with the patient and family. After consideration of risks, benefits and other options for treatment, the patient has consented to  Procedure(s): ABDOMINAL AORTOGRAM W/LOWER EXTREMITY (Bilateral) as a surgical intervention.  The patient's history has been reviewed, patient examined, no change in status, stable for surgery.  I have reviewed the patient's chart and labs.  Questions were answered to the patient's satisfaction.     [name]

## 2020-09-11 ENCOUNTER — Other Ambulatory Visit: Payer: Self-pay

## 2020-09-11 DIAGNOSIS — I739 Peripheral vascular disease, unspecified: Secondary | ICD-10-CM

## 2020-09-11 DIAGNOSIS — I771 Stricture of artery: Secondary | ICD-10-CM

## 2020-09-27 ENCOUNTER — Ambulatory Visit (INDEPENDENT_AMBULATORY_CARE_PROVIDER_SITE_OTHER): Payer: Medicaid Other | Admitting: Physician Assistant

## 2020-09-27 ENCOUNTER — Other Ambulatory Visit: Payer: Self-pay

## 2020-09-27 ENCOUNTER — Encounter: Payer: Self-pay | Admitting: Vascular Surgery

## 2020-09-27 ENCOUNTER — Ambulatory Visit (HOSPITAL_COMMUNITY)
Admission: RE | Admit: 2020-09-27 | Discharge: 2020-09-27 | Disposition: A | Discharge status: Home / Self Care | Payer: Medicaid Other | Source: Ambulatory Visit | Attending: Physician Assistant | Admitting: Physician Assistant

## 2020-09-27 ENCOUNTER — Ambulatory Visit (INDEPENDENT_AMBULATORY_CARE_PROVIDER_SITE_OTHER)
Admission: RE | Admit: 2020-09-27 | Discharge: 2020-09-27 | Disposition: A | Discharge status: Home / Self Care | Payer: Medicaid Other | Source: Ambulatory Visit | Attending: Physician Assistant | Admitting: Physician Assistant

## 2020-09-27 VITALS — BP 163/96 | HR 85 | Temp 98.1°F | Resp 20 | Ht 61.0 in | Wt 178.6 lb

## 2020-09-27 DIAGNOSIS — I771 Stricture of artery: Secondary | ICD-10-CM | POA: Diagnosis not present

## 2020-09-27 DIAGNOSIS — I739 Peripheral vascular disease, unspecified: Secondary | ICD-10-CM | POA: Insufficient documentation

## 2020-09-27 NOTE — Progress Notes (Signed)
    Postoperative Visit ([place])   History of Present Illness   Janice French is a 39 y.o. female who presents status post left common and external iliac artery stenting by Dr. Randie Heinz on 08/19/2020 due to critical left lower extremity ischemia with rest pain.  Patient states rest pain has completely resolved and she is able to ambulate without claudication.  She continues to take aspirin, Plavix, and statin daily.  Patient states she quit smoking however is vaping.  When asked if vaping products contain nicotine she states she does not know.    Current Outpatient Medications  Medication Sig Dispense Refill  . amLODipine (NORVASC) 10 MG tablet Take 1 tablet (10 mg total) by mouth daily. 30 tablet 0  . Capsaicin-Menthol-Methyl Sal (CAPSAICIN-METHYL SAL-MENTHOL) 0.025-1-12 % CREA Apply 1 application topically every 6 (six) hours as needed. Rubbed onto your abdomen every 6 hours if needed for pain and nausea 56.6 g 1  . clopidogrel (PLAVIX) 75 MG tablet Take 1 tablet (75 mg total) by mouth daily. 30 tablet 11  . HYDROcodone-acetaminophen (NORCO/VICODIN) 5-325 MG tablet Take 1 tablet by mouth every 4 (four) hours as needed. 10 tablet 0  . oxyCODONE-acetaminophen (PERCOCET/ROXICET) 5-325 MG tablet Take 1 tablet by mouth every 6 (six) hours as needed for severe pain. 20 tablet 0  . rosuvastatin (CRESTOR) 10 MG tablet Take 1 tablet (10 mg total) by mouth daily. 30 tablet 11   No current facility-administered medications for this visit.    ROS: 10 system ROS otherwise negative   For VQI Use Only   PRE-ADM LIVING: Home  AMB STATUS: Ambulatory   Physical Examination   Vitals:   09/27/20 0938  BP: (!) 163/96  Pulse: 85  Resp: 20  Temp: 98.1 F (36.7 C)  TempSrc: Temporal  SpO2: 94%  Weight: 178 lb 9.6 oz (81 kg)  Height: 5\' 1"  (1.549 m)   Body mass index is 33.75 kg/m.  General Alert, O x 3, WD, NAD  Pulmonary Sym exp, good B air movt  Cardiac RRR, Nl S1, S2,   Vascular Vessel  Right Left  Radial Palpable Palpable  PT Palpable Faintly palpable  DP Palpable Faintly palpable    Gastrointestinal soft, non-distended, non-tender to palpation,   Musculoskeletal M/S 5/5 throughout  , Extremities without ischemic changes  , No edema present,  Neurologic  Pain and light touch intact in extremities , Motor exam as listed above    Medical Decision Making   Janice French is a 39 y.o. female who presents s/p left common and external iliac artery stenting.  . Subjectively rest pain has resolved and she is ambulating without claudication symptoms . Arterial duplex demonstrates a widely patent left iliac stent without any hemodynamically significant stenosis; ABIs also demonstrate expected improvement . Continue aspirin, Plavix, statin daily . Discussed nicotine cessation . Recheck aortoiliac duplex and ABIs in 6 months; if no change at that time, we will follow annually  41 PA-C [facility] Office: [phone]  Clinic MD: [phone]

## 2020-11-19 ENCOUNTER — Other Ambulatory Visit: Payer: Self-pay

## 2020-11-19 DIAGNOSIS — R109 Unspecified abdominal pain: Secondary | ICD-10-CM | POA: Diagnosis not present

## 2020-11-19 DIAGNOSIS — M25511 Pain in right shoulder: Secondary | ICD-10-CM | POA: Insufficient documentation

## 2020-11-19 DIAGNOSIS — F17290 Nicotine dependence, other tobacco product, uncomplicated: Secondary | ICD-10-CM | POA: Diagnosis not present

## 2020-11-19 DIAGNOSIS — R079 Chest pain, unspecified: Secondary | ICD-10-CM | POA: Diagnosis not present

## 2020-11-19 DIAGNOSIS — I10 Essential (primary) hypertension: Secondary | ICD-10-CM | POA: Insufficient documentation

## 2020-11-19 DIAGNOSIS — M25512 Pain in left shoulder: Secondary | ICD-10-CM | POA: Insufficient documentation

## 2020-11-19 DIAGNOSIS — S0990XA Unspecified injury of head, initial encounter: Secondary | ICD-10-CM | POA: Insufficient documentation

## 2020-11-19 DIAGNOSIS — M79641 Pain in right hand: Secondary | ICD-10-CM | POA: Diagnosis not present

## 2020-11-19 DIAGNOSIS — M79644 Pain in right finger(s): Secondary | ICD-10-CM | POA: Insufficient documentation

## 2020-11-19 DIAGNOSIS — S161XXA Strain of muscle, fascia and tendon at neck level, initial encounter: Secondary | ICD-10-CM | POA: Diagnosis not present

## 2020-11-19 DIAGNOSIS — R102 Pelvic and perineal pain: Secondary | ICD-10-CM | POA: Diagnosis not present

## 2020-11-19 DIAGNOSIS — R0789 Other chest pain: Secondary | ICD-10-CM | POA: Insufficient documentation

## 2020-11-19 DIAGNOSIS — R519 Headache, unspecified: Secondary | ICD-10-CM | POA: Diagnosis not present

## 2020-11-19 DIAGNOSIS — R1084 Generalized abdominal pain: Secondary | ICD-10-CM | POA: Insufficient documentation

## 2020-11-19 DIAGNOSIS — M542 Cervicalgia: Secondary | ICD-10-CM | POA: Diagnosis not present

## 2020-11-19 NOTE — ED Triage Notes (Signed)
Called for triage. No response. 

## 2020-11-20 ENCOUNTER — Emergency Department (HOSPITAL_BASED_OUTPATIENT_CLINIC_OR_DEPARTMENT_OTHER): Payer: Medicaid Other

## 2020-11-20 ENCOUNTER — Emergency Department (HOSPITAL_BASED_OUTPATIENT_CLINIC_OR_DEPARTMENT_OTHER)
Admission: EM | Admit: 2020-11-20 | Discharge: 2020-11-20 | Disposition: A | Discharge status: Home / Self Care | Payer: Medicaid Other | Attending: Emergency Medicine | Admitting: Emergency Medicine

## 2020-11-20 ENCOUNTER — Encounter (HOSPITAL_BASED_OUTPATIENT_CLINIC_OR_DEPARTMENT_OTHER): Payer: Self-pay | Admitting: Emergency Medicine

## 2020-11-20 DIAGNOSIS — S161XXA Strain of muscle, fascia and tendon at neck level, initial encounter: Secondary | ICD-10-CM | POA: Diagnosis not present

## 2020-11-20 DIAGNOSIS — R109 Unspecified abdominal pain: Secondary | ICD-10-CM | POA: Diagnosis not present

## 2020-11-20 DIAGNOSIS — R1084 Generalized abdominal pain: Secondary | ICD-10-CM | POA: Diagnosis not present

## 2020-11-20 DIAGNOSIS — R102 Pelvic and perineal pain: Secondary | ICD-10-CM | POA: Diagnosis not present

## 2020-11-20 DIAGNOSIS — M79641 Pain in right hand: Secondary | ICD-10-CM | POA: Diagnosis not present

## 2020-11-20 DIAGNOSIS — R079 Chest pain, unspecified: Secondary | ICD-10-CM | POA: Diagnosis not present

## 2020-11-20 DIAGNOSIS — R0789 Other chest pain: Secondary | ICD-10-CM | POA: Diagnosis not present

## 2020-11-20 DIAGNOSIS — R519 Headache, unspecified: Secondary | ICD-10-CM | POA: Diagnosis not present

## 2020-11-20 DIAGNOSIS — S0990XA Unspecified injury of head, initial encounter: Secondary | ICD-10-CM

## 2020-11-20 DIAGNOSIS — M542 Cervicalgia: Secondary | ICD-10-CM | POA: Diagnosis not present

## 2020-11-20 LAB — CBC WITH DIFFERENTIAL/PLATELET
Abs Immature Granulocytes: 0 10*3/uL (ref 0.00–0.07)
Basophils Absolute: 0 10*3/uL (ref 0.0–0.1)
Basophils Relative: 0 %
Eosinophils Absolute: 0.2 10*3/uL (ref 0.0–0.5)
Eosinophils Relative: 2 %
HCT: 40 % (ref 36.0–46.0)
Hemoglobin: 13.8 g/dL (ref 12.0–15.0)
Lymphocytes Relative: 52 %
Lymphs Abs: 4.7 10*3/uL — ABNORMAL HIGH (ref 0.7–4.0)
MCH: 32.9 pg (ref 26.0–34.0)
MCHC: 34.5 g/dL (ref 30.0–36.0)
MCV: 95.5 fL (ref 80.0–100.0)
Monocytes Absolute: 0.5 10*3/uL (ref 0.1–1.0)
Monocytes Relative: 6 %
Neutro Abs: 3.6 10*3/uL (ref 1.7–7.7)
Neutrophils Relative %: 40 %
Platelets: 277 10*3/uL (ref 150–400)
RBC: 4.19 MIL/uL (ref 3.87–5.11)
RDW: 12.9 % (ref 11.5–15.5)
WBC: 9.1 10*3/uL (ref 4.0–10.5)
nRBC: 0 % (ref 0.0–0.2)

## 2020-11-20 LAB — COMPREHENSIVE METABOLIC PANEL
ALT: 12 U/L (ref 0–44)
AST: 17 U/L (ref 15–41)
Albumin: 3.6 g/dL (ref 3.5–5.0)
Alkaline Phosphatase: 75 U/L (ref 38–126)
Anion gap: 8 (ref 5–15)
BUN: 8 mg/dL (ref 6–20)
CO2: 23 mmol/L (ref 22–32)
Calcium: 8.8 mg/dL — ABNORMAL LOW (ref 8.9–10.3)
Chloride: 104 mmol/L (ref 98–111)
Creatinine, Ser: 0.61 mg/dL (ref 0.44–1.00)
GFR, Estimated: >60 mL/min (ref >60)
Glucose, Bld: 107 mg/dL — ABNORMAL HIGH (ref 70–99)
Potassium: 3.8 mmol/L (ref 3.5–5.1)
Sodium: 135 mmol/L (ref 135–145)
Total Bilirubin: 0.3 mg/dL (ref 0.3–1.2)
Total Protein: 6.9 g/dL (ref 6.5–8.1)

## 2020-11-20 LAB — PREGNANCY, URINE: Preg Test, Ur: NEGATIVE

## 2020-11-20 MED ORDER — IBUPROFEN 800 MG PO TABS: 800.0000 mg | ORAL_TABLET | Freq: Three times a day (TID) | ORAL | 0 refills | Status: DC | PRN

## 2020-11-20 MED ORDER — IOHEXOL 300 MG/ML  SOLN
100.0000 mL | Freq: Once | INTRAMUSCULAR | Status: AC | PRN
  Administered 2020-11-20: 04:00:00 100 mL via INTRAVENOUS

## 2020-11-20 MED ORDER — METHOCARBAMOL 500 MG PO TABS: 500.0000 mg | ORAL_TABLET | Freq: Three times a day (TID) | ORAL | 0 refills | Status: DC | PRN

## 2020-11-20 NOTE — ED Notes (Signed)
Patient transported to CT 
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
## 2020-11-20 NOTE — Discharge Instructions (Signed)

## 2020-11-20 NOTE — ED Notes (Signed)
Pt has hx of high blood pressure and has been off of her medication
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
## 2020-12-03 DIAGNOSIS — H5213 Myopia, bilateral: Secondary | ICD-10-CM | POA: Diagnosis not present

## 2021-02-04 ENCOUNTER — Emergency Department (HOSPITAL_COMMUNITY): Payer: Medicaid Other

## 2021-02-04 ENCOUNTER — Emergency Department (HOSPITAL_COMMUNITY)
Admission: EM | Admit: 2021-02-04 | Discharge: 2021-02-04 | Disposition: A | Discharge status: Home / Self Care | Payer: Medicaid Other | Attending: Emergency Medicine | Admitting: Emergency Medicine

## 2021-02-04 ENCOUNTER — Encounter (HOSPITAL_COMMUNITY): Payer: Self-pay | Admitting: Emergency Medicine

## 2021-02-04 ENCOUNTER — Other Ambulatory Visit: Payer: Self-pay

## 2021-02-04 DIAGNOSIS — F17290 Nicotine dependence, other tobacco product, uncomplicated: Secondary | ICD-10-CM | POA: Diagnosis not present

## 2021-02-04 DIAGNOSIS — M7989 Other specified soft tissue disorders: Secondary | ICD-10-CM | POA: Diagnosis not present

## 2021-02-04 DIAGNOSIS — I70212 Atherosclerosis of native arteries of extremities with intermittent claudication, left leg: Secondary | ICD-10-CM | POA: Diagnosis not present

## 2021-02-04 DIAGNOSIS — I10 Essential (primary) hypertension: Secondary | ICD-10-CM | POA: Diagnosis not present

## 2021-02-04 DIAGNOSIS — Z79899 Other long term (current) drug therapy: Secondary | ICD-10-CM | POA: Diagnosis not present

## 2021-02-04 DIAGNOSIS — Z7902 Long term (current) use of antithrombotics/antiplatelets: Secondary | ICD-10-CM | POA: Insufficient documentation

## 2021-02-04 DIAGNOSIS — I739 Peripheral vascular disease, unspecified: Secondary | ICD-10-CM

## 2021-02-04 DIAGNOSIS — M25562 Pain in left knee: Secondary | ICD-10-CM | POA: Diagnosis not present

## 2021-02-04 DIAGNOSIS — M79605 Pain in left leg: Secondary | ICD-10-CM | POA: Diagnosis present

## 2021-02-04 MED ORDER — CLOPIDOGREL BISULFATE 75 MG PO TABS: 75.0000 mg | ORAL_TABLET | Freq: Every day | ORAL | 11 refills | Status: AC

## 2021-02-04 MED ORDER — HYDROCODONE-ACETAMINOPHEN 5-325 MG PO TABS
1.0000 | ORAL_TABLET | Freq: Once | ORAL | Status: AC
  Administered 2021-02-04: 1 via ORAL
  Filled 2021-02-04: qty 1

## 2021-02-04 MED ORDER — ROSUVASTATIN CALCIUM 10 MG PO TABS: 10.0000 mg | ORAL_TABLET | Freq: Every day | ORAL | 11 refills | Status: DC

## 2021-02-04 NOTE — ED Provider Notes (Signed)
[facility] EMERGENCY DEPARTMENT Provider Note   CSN: [id number] Arrival date & time: 02/04/21  0253     History Chief Complaint  Patient presents with  . Knee Swelling    Janice French is a 40 y.o. female.  HPI     This a 40 year old female with a history of hypertension, smoking, peripheral vascular disease who presents with left leg pain.  Initially she stated most of the pain and swelling was in her left knee.  She denied any trauma or injury.  She states pain is worse with walking.  She also has begun to have pain in her left foot and left great toe.  After further history taking, she endorses that this pain is similar to when she needed to have a stent placed in her leg.  Pain is worse with ambulation but she also has some at rest.  She states that her right great toe feels numb.  She rates her pain at 9 out of 10.  She is not taking anything for the pain.  She is not currently taking her Plavix or Crestor.    I reviewed the patient's chart.  In September 2021, patient had a stent placed in the left common iliac for partial occlusion.  She was subsequently placed on Plavix and Crestor and encouraged to quit smoking.  She followed up with reassuring exam and history.  She is due to follow-up within 6 months.  Past Medical History:  Diagnosis Date  . Dysmenorrhea   . Hypertension     Patient Active Problem List   Diagnosis Date Noted  . Carpal tunnel syndrome of right wrist 02/27/2020  . Capsulitis of left shoulder 11/28/2019  . Left elbow pain 11/28/2019  . Ganglion cyst of dorsum of left wrist 11/28/2019  . Essential hypertension 11/28/2019  . Acute foot pain, left 06/10/2018  . Acute left ankle pain 06/10/2018    Past Surgical History:  Procedure Laterality Date  . ABDOMINAL AORTOGRAM W/LOWER EXTREMITY Bilateral 08/19/2020   Procedure: ABDOMINAL AORTOGRAM W/LOWER EXTREMITY;  Surgeon: Maeola Harman, MD;  Location: [facility] INVASIVE CV LAB;   Service: Cardiovascular;  Laterality: Bilateral;  . CHOLECYSTECTOMY    . PERIPHERAL VASCULAR INTERVENTION Left 08/19/2020   Procedure: PERIPHERAL VASCULAR INTERVENTION;  Surgeon: Maeola Harman, MD;  Location: [facility] INVASIVE CV LAB;  Service: Cardiovascular;  Laterality: Left;  Common iliac  . TUBAL LIGATION       OB History   No obstetric history on file.     Family History  Problem Relation Age of Onset  . Hypertension Other   . Cancer Other     Social History   Tobacco Use  . Smoking status: Current Every Day Smoker    Types: Cigars  . Smokeless tobacco: Never Used  Vaping Use  . Vaping Use: Some days  . Substances: Nicotine  Substance Use Topics  . Alcohol use: No  . Drug use: Not Currently    Types: Marijuana    Home Medications Prior to Admission medications   Medication Sig Start Date End Date Taking? Authorizing Provider  amLODipine (NORVASC) 10 MG tablet Take 1 tablet (10 mg total) by mouth daily. 08/06/20   Jeannie Fend, PA-C  Capsaicin-Menthol-Methyl Sal (CAPSAICIN-METHYL SAL-MENTHOL) 0.025-1-12 % CREA Apply 1 application topically every 6 (six) hours as needed. Rubbed onto your abdomen every 6 hours if needed for pain and nausea 06/23/19   Arby Barrette, MD  clopidogrel (PLAVIX) 75 MG tablet Take 1 tablet (75  mg total) by mouth daily. 02/04/21 02/04/22  Mariana Goytia, Mayer Masker, MD  HYDROcodone-acetaminophen (NORCO/VICODIN) 5-325 MG tablet Take 1 tablet by mouth every 4 (four) hours as needed. 08/06/20   Jeannie Fend, PA-C  ibuprofen (ADVIL) 800 MG tablet Take 1 tablet (800 mg total) by mouth every 8 (eight) hours as needed for moderate pain. 11/20/20   Long, Arlyss Repress, MD  methocarbamol (ROBAXIN) 500 MG tablet Take 1 tablet (500 mg total) by mouth every 8 (eight) hours as needed for muscle spasms. 11/20/20   Long, Arlyss Repress, MD  oxyCODONE-acetaminophen (PERCOCET/ROXICET) 5-325 MG tablet Take 1 tablet by mouth every 6 (six) hours as needed for severe pain.  08/19/20   Lars Mage, PA-C  rosuvastatin (CRESTOR) 10 MG tablet Take 1 tablet (10 mg total) by mouth daily. 02/04/21 02/04/22  Abdifatah Colquhoun, Mayer Masker, MD  promethazine (PHENERGAN) 25 MG tablet Take 1 tablet (25 mg total) by mouth every 6 (six) hours as needed for nausea or vomiting. 06/23/19 08/06/20  Arby Barrette, MD    Allergies    Patient has no known allergies.  Review of Systems   Review of Systems  Constitutional: Negative for fever.  Respiratory: Negative for shortness of breath.   Gastrointestinal: Negative for nausea and vomiting.  Musculoskeletal:       Knee and foot pain  Neurological: Positive for numbness.  All other systems reviewed and are negative.   Physical Exam Updated Vital Signs BP (!) 159/103 (BP Location: Right Arm)   Pulse 94   Temp 98.4 F (36.9 C) (Oral)   Resp 16   Ht 1.524 m (5')   Wt 90 kg   LMP 12/26/2020   SpO2 100%   BMI 38.75 kg/m   Physical Exam Vitals and nursing note reviewed.  Constitutional:      Appearance: She is well-developed. She is obese. She is not ill-appearing.  HENT:     Head: Normocephalic and atraumatic.     Nose: Nose normal.     Mouth/Throat:     Mouth: Mucous membranes are moist.  Eyes:     Pupils: Pupils are equal, round, and reactive to light.  Cardiovascular:     Rate and Rhythm: Normal rate and regular rhythm.     Heart sounds: Normal heart sounds.  Pulmonary:     Effort: Pulmonary effort is normal. No respiratory distress.     Breath sounds: No wheezing.  Abdominal:     Palpations: Abdomen is soft.     Tenderness: There is no abdominal tenderness.  Musculoskeletal:     Cervical back: Neck supple.     Comments: Focused examination of the left lower extremity with normal range of motion of the left knee, there is no joint line tenderness, no obvious deformities, no overlying skin changes or erythema, slight effusion noted over the medial aspect of the knee Focused examination of the left foot with thready  DP pulse when compared to the right which is 2+, foot is warm to touch, no significant discoloration, great toe slightly cool to touch with capillary refill greater than 3  Skin:    General: Skin is warm and dry.  Neurological:     Mental Status: She is alert and oriented to person, place, and time.  Psychiatric:        Mood and Affect: Mood normal.     ED Results / Procedures / Treatments   Labs (all labs ordered are listed, but only abnormal results are displayed) Labs Reviewed - No  data to display  EKG None  Radiology DG Knee Complete 4 Views Left  Result Date: 02/04/2021 CLINICAL DATA:  Left knee pain and swelling EXAM: LEFT KNEE - COMPLETE 4+ VIEW COMPARISON:  None. FINDINGS: Frontal, bilateral oblique, lateral views of the left knee are obtained. No fracture, subluxation, or dislocation. Joint spaces are well preserved. No joint effusion. Soft tissues are unremarkable. IMPRESSION: 1. Unremarkable left knee. Electronically Signed   By: Sharlet Salina M.D.   On: 02/04/2021 03:27    Procedures Procedures   Medications Ordered in ED Medications  HYDROcodone-acetaminophen (NORCO/VICODIN) 5-325 MG per tablet 1 tablet (1 tablet Oral Given 02/04/21 0347)    ED Course  I have reviewed the triage vital signs and the nursing notes.  Pertinent labs & imaging results that were available during my care of the patient were reviewed by me and considered in my medical decision making (see chart for details).    MDM Rules/Calculators/A&P                          Patient presents with left leg pain.  Initially endorsed mostly knee pain but then reported some foot pain consistent with her prior symptoms of claudication.  She is overall nontoxic.  She is hypertensive at 159/103.  Her knee exam itself is fairly benign.  She has a slight effusion but no evidence of trauma, infection.  X-rays reviewed from triage and are negative.  Given foot pain, question whether her symptoms may be related  to her peripheral vascular disease especially given that she is not taking her Plavix or Crestor which would put her at increased risk for recurrence or in-stent stenosis.  She does have a thready palpable left DP pulse but it is markedly asymmetric to the right.  She also has a cool great toe.  No critical ischemia noted on exam.  I stressed to the patient the importance of taking her medications.  She needs to follow-up with vascular surgery.  Message sent to her primary vascular surgeon, Dr. Randie Heinz.  No indication for imaging at this time but patient was advised that she has marked increase in pain or discoloration of the extremity, she should be reevaluated immediately.  Patient was given refills for Crestor and Plavix.  After history, exam, and medical workup I feel the patient has been appropriately medically screened and is safe for discharge home. Pertinent diagnoses were discussed with the patient. Patient was given return precautions.  Final Clinical Impression(s) / ED Diagnoses Final diagnoses:  Left leg claudication (HCC)    Rx / DC Orders ED Discharge Orders         Ordered    clopidogrel (PLAVIX) 75 MG tablet  Daily        02/04/21 0400    rosuvastatin (CRESTOR) 10 MG tablet  Daily        02/04/21 0400           Vesper Trant, Mayer Masker, MD 02/04/21 [phone]

## 2021-02-04 NOTE — ED Triage Notes (Signed)
Patient reports left knee pain with swelling yesterday /ambulatory , denies injury.

## 2021-02-04 NOTE — Discharge Instructions (Addendum)
You were seen today for left knee and leg pain.  It is very important that you follow-up with your vascular surgeon.  Some of your pain may be related to ongoing peripheral vascular disease especially given that you have not been taking her medications.  Please get your medications refilled and start taking Plavix and Crestor.  Additionally, you should cease vaping.  If you note acute worsening of pain or discoloration of your foot or toes, you need to seek evaluation immediately.

## 2021-02-05 ENCOUNTER — Telehealth: Payer: Self-pay | Admitting: *Deleted

## 2021-02-05 NOTE — Telephone Encounter (Signed)
Transition Care Management Unsuccessful Follow-up Telephone Call  Date of discharge and from where:  02-04-2021  Janice French  Attempts: 1st attempt  Reason for unsuccessful TCM follow-up call:  No answer/ left message

## 2021-02-06 NOTE — Telephone Encounter (Signed)
Transition Care Management Unsuccessful Follow-up Telephone Call  Date of discharge and from where:  02/04/21 from [facility]  Attempts:  2nd Attempt  Reason for unsuccessful TCM follow-up call:  Left voice message

## 2021-02-07 NOTE — Telephone Encounter (Signed)
Transition Care Management Unsuccessful Follow-up Telephone Call  Date of discharge and from where:  02/04/2021 from [facility]  Attempts:  3rd Attempt  Reason for unsuccessful TCM follow-up call:  Left voice message     

## 2021-02-19 ENCOUNTER — Other Ambulatory Visit: Payer: Self-pay | Admitting: *Deleted

## 2021-02-19 DIAGNOSIS — I739 Peripheral vascular disease, unspecified: Secondary | ICD-10-CM

## 2021-02-19 DIAGNOSIS — I771 Stricture of artery: Secondary | ICD-10-CM
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
## 2021-02-22 ENCOUNTER — Emergency Department (HOSPITAL_BASED_OUTPATIENT_CLINIC_OR_DEPARTMENT_OTHER)
Admission: EM | Admit: 2021-02-22 | Discharge: 2021-02-23 | Disposition: A | Discharge status: Home / Self Care | Payer: Medicaid Other | Attending: Emergency Medicine | Admitting: Emergency Medicine

## 2021-02-22 ENCOUNTER — Other Ambulatory Visit: Payer: Self-pay

## 2021-02-22 ENCOUNTER — Encounter (HOSPITAL_BASED_OUTPATIENT_CLINIC_OR_DEPARTMENT_OTHER): Payer: Self-pay | Admitting: Emergency Medicine

## 2021-02-22 DIAGNOSIS — L309 Dermatitis, unspecified: Secondary | ICD-10-CM | POA: Insufficient documentation

## 2021-02-22 DIAGNOSIS — M79644 Pain in right finger(s): Secondary | ICD-10-CM | POA: Diagnosis not present

## 2021-02-22 DIAGNOSIS — F17290 Nicotine dependence, other tobacco product, uncomplicated: Secondary | ICD-10-CM | POA: Insufficient documentation

## 2021-02-22 DIAGNOSIS — Z7902 Long term (current) use of antithrombotics/antiplatelets: Secondary | ICD-10-CM | POA: Insufficient documentation

## 2021-02-22 DIAGNOSIS — M79641 Pain in right hand: Secondary | ICD-10-CM | POA: Diagnosis not present

## 2021-02-22 DIAGNOSIS — Z79899 Other long term (current) drug therapy: Secondary | ICD-10-CM | POA: Diagnosis not present

## 2021-02-22 DIAGNOSIS — I10 Essential (primary) hypertension: Secondary | ICD-10-CM | POA: Diagnosis not present

## 2021-02-22 MED ORDER — HYDROCORTISONE 1 % EX CREA
TOPICAL_CREAM | Freq: Three times a day (TID) | CUTANEOUS | Status: DC
  Filled 2021-02-22: qty 28

## 2021-02-22 MED ORDER — NAPROXEN 250 MG PO TABS
500.0000 mg | ORAL_TABLET | Freq: Once | ORAL | Status: AC
  Administered 2021-02-22: 500 mg via ORAL
  Filled 2021-02-22: qty 2

## 2021-02-22 NOTE — ED Provider Notes (Signed)
[facility] Provider Note: Lowella Dell, MD, FACEP  CSN: [id number] MRN: [id number] ARRIVAL: 02/22/21 at 2027 ROOM: [facility]11/[facility]11   CHIEF COMPLAINT  Thumb Pain   HISTORY OF PRESENT ILLNESS  02/22/21 11:44 PM Janice French is a 40 y.o. female with about 2 months of pain in her right thumb.  The pain is located on the volar aspect of the thumb and she also has decreased sensation at the tip of the thumb.  Pain is worse with flexion and extension or with palpation of the flexor surface.  She is also having similar but lesser symptoms of the right third and fourth fingers.  She rates her pain as a 10 out of 10 at its worst and describes it as aching, sharp and burning.  She has not been taking anything for her pain.  She is also had a mild, eczematous rash of her upper extremities, primarily in the elbow folds.  This has been present for several days.   Past Medical History:  Diagnosis Date  . Dysmenorrhea   . Hypertension     Past Surgical History:  Procedure Laterality Date  . ABDOMINAL AORTOGRAM W/LOWER EXTREMITY Bilateral 08/19/2020   Procedure: ABDOMINAL AORTOGRAM W/LOWER EXTREMITY;  Surgeon: Maeola Harman, MD;  Location: [facility] INVASIVE CV LAB;  Service: Cardiovascular;  Laterality: Bilateral;  . CHOLECYSTECTOMY    . PERIPHERAL VASCULAR INTERVENTION Left 08/19/2020   Procedure: PERIPHERAL VASCULAR INTERVENTION;  Surgeon: Maeola Harman, MD;  Location: [facility] INVASIVE CV LAB;  Service: Cardiovascular;  Laterality: Left;  Common iliac  . TUBAL LIGATION      Family History  Problem Relation Age of Onset  . Hypertension Other   . Cancer Other     Social History   Tobacco Use  . Smoking status: Current Every Day Smoker    Types: Cigars  . Smokeless tobacco: Never Used  Vaping Use  . Vaping Use: Some days  . Substances: Nicotine  Substance Use Topics  . Alcohol use: No  . Drug use: Not Currently    Types: Marijuana    Prior to Admission  medications   Medication Sig Start Date End Date Taking? Authorizing Provider  naproxen (NAPROSYN) 375 MG tablet Take 1 tablet twice daily as needed for hand pain. 02/23/21  Yes Chaselyn Nanney, MD  amLODipine (NORVASC) 10 MG tablet Take 1 tablet (10 mg total) by mouth daily. 08/06/20   Jeannie Fend, PA-C  Capsaicin-Menthol-Methyl Sal (CAPSAICIN-METHYL SAL-MENTHOL) 0.025-1-12 % CREA Apply 1 application topically every 6 (six) hours as needed. Rubbed onto your abdomen every 6 hours if needed for pain and nausea 06/23/19   Arby Barrette, MD  clopidogrel (PLAVIX) 75 MG tablet Take 1 tablet (75 mg total) by mouth daily. 02/04/21 02/04/22  Horton, Mayer Masker, MD  rosuvastatin (CRESTOR) 10 MG tablet Take 1 tablet (10 mg total) by mouth daily. 02/04/21 02/04/22  Horton, Mayer Masker, MD  promethazine (PHENERGAN) 25 MG tablet Take 1 tablet (25 mg total) by mouth every 6 (six) hours as needed for nausea or vomiting. 06/23/19 08/06/20  Arby Barrette, MD    Allergies Patient has no known allergies.   REVIEW OF SYSTEMS  Negative except as noted here or in the History of Present Illness.   PHYSICAL EXAMINATION  Initial Vital Signs Blood pressure (!) 189/115, pulse 96, temperature 98.3 F (36.8 C), temperature source Oral, resp. rate 18, height 5' (1.524 m), weight 81.6 kg, last menstrual period 01/21/2021, SpO2 100 %.  Examination General: Well-developed, well-nourished  female in no acute distress; appearance consistent with age of record HENT: normocephalic; atraumatic Eyes: Normal appearance Neck: supple Heart: regular rate and rhythm Lungs: clear to auscultation bilaterally Abdomen: soft; nondistended; nontender; bowel sounds present Extremities: No deformity; tenderness and swelling of volar surfaces of right thumb, middle finger and ring finger; negative Finkelstein's test Neurologic: Awake, alert and oriented; motor function intact in all extremities and symmetric; no facial droop Skin: Warm and  dry Psychiatric: Normal mood and affect   RESULTS  Summary of this visit's results, reviewed and interpreted by myself:   EKG Interpretation  Date/Time:    Ventricular Rate:    PR Interval:    QRS Duration:   QT Interval:    QTC Calculation:   R Axis:     Text Interpretation:        Laboratory Studies: No results found for this or any previous visit (from the past 24 hour(s)). Imaging Studies: DG Hand Complete Right  Result Date: 02/23/2021 CLINICAL DATA:  Pain in the thumb, middle, and ring fingers of the right hand for 2 months. EXAM: RIGHT HAND - COMPLETE 3+ VIEW COMPARISON:  11/20/2020 FINDINGS: There is no evidence of fracture or dislocation. There is no evidence of arthropathy or other focal bone abnormality. Soft tissues are unremarkable. IMPRESSION: Negative. Electronically Signed   By: Burman Nieves M.D.   On: 02/23/2021 00:35    ED COURSE and MDM  Nursing notes, initial and subsequent vitals signs, including pulse oximetry, reviewed and interpreted by myself.  Vitals:   02/22/21 2044 02/22/21 2046 02/23/21 0050  BP: (!) 189/115  (!) 173/87  Pulse: 96  72  Resp: 18  18  Temp: 98.3 F (36.8 C)    TempSrc: Oral    SpO2: 100%  97%  Weight:  81.6 kg   Height:  5' (1.524 m)    Medications  hydrocortisone cream 1 % ( Topical Given 02/23/21 0000)  naproxen (NAPROSYN) tablet 500 mg (500 mg Oral Given 02/22/21 2359)   Patient's presentation is consistent with arthritis or flexor tendinitis of the hand but not de Quervain's tenosynovitis.  We will place her in a thumb spica splint and an NSAID and refer her to hand surgery.  The rash appears to be contact dermatitis or eczema we will provide hydrocortisone cream.   PROCEDURES  Procedures   ED DIAGNOSES     ICD-10-CM   1. Hand pain, right  M79.641   2. Dermatitis  L30.9        Shaquinta Peruski, Jonny Ruiz, MD 02/23/21 [phone]

## 2021-02-22 NOTE — ED Provider Notes (Signed)
MSE was initiated and I personally evaluated the patient and placed orders (if any) at  9:20 PM on February 22, 2021.  The patient appears stable so that the remainder of the MSE may be completed by another provider.   Eilleen Davoli, MD 02/22/21 2120  

## 2021-02-22 NOTE — ED Triage Notes (Signed)
Reports pain in right thumb that's been bothering her since January.  Reports numbness in that hand as well.  Also reports a rash.  BP elevated in triage.  Reports she hasn't had any of her meds since September.

## 2021-02-23 ENCOUNTER — Emergency Department (HOSPITAL_BASED_OUTPATIENT_CLINIC_OR_DEPARTMENT_OTHER): Payer: Medicaid Other

## 2021-02-23 DIAGNOSIS — M79644 Pain in right finger(s): Secondary | ICD-10-CM | POA: Diagnosis not present

## 2021-02-23 DIAGNOSIS — M79641 Pain in right hand: Secondary | ICD-10-CM | POA: Diagnosis not present

## 2021-02-23 MED ORDER — NAPROXEN 375 MG PO TABS: ORAL_TABLET | ORAL | 0 refills | Status: DC

## 2021-02-24 DIAGNOSIS — G5601 Carpal tunnel syndrome, right upper limb: Secondary | ICD-10-CM | POA: Diagnosis not present

## 2021-02-24 DIAGNOSIS — M65341 Trigger finger, right ring finger: Secondary | ICD-10-CM | POA: Diagnosis not present

## 2021-02-28 ENCOUNTER — Other Ambulatory Visit: Payer: Self-pay

## 2021-02-28 ENCOUNTER — Ambulatory Visit (HOSPITAL_COMMUNITY)
Admission: RE | Admit: 2021-02-28 | Discharge: 2021-02-28 | Disposition: A | Discharge status: Home / Self Care | Payer: Medicaid Other | Source: Ambulatory Visit | Attending: Vascular Surgery | Admitting: Vascular Surgery

## 2021-02-28 ENCOUNTER — Ambulatory Visit (INDEPENDENT_AMBULATORY_CARE_PROVIDER_SITE_OTHER): Payer: Medicaid Other | Admitting: Physician Assistant

## 2021-02-28 ENCOUNTER — Ambulatory Visit (INDEPENDENT_AMBULATORY_CARE_PROVIDER_SITE_OTHER)
Admission: RE | Admit: 2021-02-28 | Discharge: 2021-02-28 | Disposition: A | Discharge status: Home / Self Care | Payer: Medicaid Other | Source: Ambulatory Visit | Attending: Vascular Surgery | Admitting: Vascular Surgery

## 2021-02-28 VITALS — BP 141/89 | HR 71 | Temp 98.6°F | Resp 20 | Ht 60.0 in | Wt 183.0 lb

## 2021-02-28 DIAGNOSIS — I771 Stricture of artery: Secondary | ICD-10-CM | POA: Diagnosis not present

## 2021-02-28 DIAGNOSIS — I739 Peripheral vascular disease, unspecified: Secondary | ICD-10-CM

## 2021-02-28 NOTE — Progress Notes (Signed)
Peripheral Arterial Disease Follow-Up   VASCULAR SURGERY ASSESSMENT & PLAN:   Janice French is a 40 y.o. female  Stable peripheral arterial disease.  Normal ABIs.  Left common iliac artery and external iliac artery stents are patent.  Patient is without claudication or rest pain.  She recently had left lower extremity swelling and hand pain and was seen in the emergency department.  She has resumed her medication as prescribed and says her symptoms resolved.   Continue optimal medical management of hypertension and follow-up with primary care physician. Encouraged complete nicotine cessation. Continue the following medications: Statin, Plavix Follow-up in 6 months.  Right hand pain.  No signs of arterial insufficiency.  She has a follow-up with her hand specialist in several weeks.  SUBJECTIVE:   The patient denies lower extremity pain with exercise or rest pain.  She experienced left lower extremity edema and pain and was seen in the emergency department on February 04, 2021.  She was out of her Crestor and Plavix.  She was examined with no signs of limb ischemia and released.  Dr. Randie Heinz was messaged and confirmed follow-up plans.  She was strongly advised to resume her medications as prescribed.  She followed these instructions and said her symptoms resolved.  Denies skin loss or ulceration. States she recently developed numbness and radiating pain in her right hand.  She says she was seen by a hand specialist and had an injection.  PHYSICAL EXAM:   Vitals:   02/28/21 0914  BP: (!) 141/89  Pulse: 71  Resp: 20  Temp: 98.6 F (37 C)  TempSrc: Temporal  SpO2: 99%  Weight: 183 lb (83 kg)  Height: 5' (1.524 m)    General appearance: Well-developed, well-nourished in no apparent distress Neurologic: Alert and oriented x 4. Cardiovascular: Heart rate and rhythm are regular.   Abdomen: No palpable pulsatile mass. Extremities: Skin intact.  Both feet are warm and well perfused.  Motor  function and sensation intact Pulse exam: 1+ left anterior tibial, 2+ posterior tibial pulse.  No skin changes to left foot 2+ right radial pulse, triphasic palmar arch Doppler signal.  5 out of 5 right hand grip strength.  2+ right brachial pulse  NON-INVASIVE VASCULAR STUDIES   Left Stent(s):  +-----------------------------------+--------+--------+---------+--------+  common into external iliac arteriesPSV cm/sStenosisWaveform Comments  +-----------------------------------+--------+--------+---------+--------+  Prox to Stent           78       biphasic       +-----------------------------------+--------+--------+---------+--------+  Proximal Stent           89       biphasic       +-----------------------------------+--------+--------+---------+--------+  Mid Stent             78       biphasic       +-----------------------------------+--------+--------+---------+--------+  Distal Stent            77       triphasic      +-----------------------------------+--------+--------+---------+--------+  Distal to Stent          107       triphasic      +-----------------------------------+--------+--------+---------+--------+     Summary:  Stenosis:  Patent left CIA/EIA stent with no stenosis noted.     PROBLEM LIST:    The patient's past medical history, past surgical history, family history, social history, allergy list and medication list are reviewed.   CURRENT MEDS:    Current Outpatient Medications:  .  amLODipine (NORVASC)  10 MG tablet, Take 1 tablet (10 mg total) by mouth daily., Disp: 30 tablet, Rfl: 0 .  Capsaicin-Menthol-Methyl Sal (CAPSAICIN-METHYL SAL-MENTHOL) 0.025-1-12 % CREA, Apply 1 application topically every 6 (six) hours as needed. Rubbed onto your abdomen every 6 hours if needed for pain and nausea, Disp: 56.6 g, Rfl:  1 .  clopidogrel (PLAVIX) 75 MG tablet, Take 1 tablet (75 mg total) by mouth daily., Disp: 30 tablet, Rfl: 11 .  HYDROcodone-acetaminophen (NORCO/VICODIN) 5-325 MG tablet, Take 1 tablet by mouth every 6 (six) hours., Disp: , Rfl:  .  naproxen (NAPROSYN) 375 MG tablet, Take 1 tablet twice daily as needed for hand pain., Disp: 30 tablet, Rfl: 0 .  rosuvastatin (CRESTOR) 10 MG tablet, Take 1 tablet (10 mg total) by mouth daily., Disp: 30 tablet, Rfl: 11   REVIEW OF SYSTEMS:   [X]  denotes positive finding, [ ]  denotes negative finding Cardiac  Comments:  Chest pain or chest pressure:    Shortness of breath upon exertion:    Short of breath when lying flat:    Irregular heart rhythm:        Vascular    Pain in calf, thigh, or hip brought on by ambulation:    Pain in feet at night that wakes you up from your sleep:     Blood clot in your veins:    Leg swelling:         Pulmonary    Oxygen at home:    Productive cough:     Wheezing:         Neurologic    Sudden weakness in arms or legs:     Sudden numbness in arms or legs:     Sudden onset of difficulty speaking or slurred speech:    Temporary loss of vision in one eye:     Problems with dizziness:         Gastrointestinal    Blood in stool:     Vomited blood:         Genitourinary    Burning when urinating:     Blood in urine:        Psychiatric    Major depression:         Hematologic    Bleeding problems:    Problems with blood clotting too easily:        Skin    Rashes or ulcers:        Constitutional    Fever or chills:     , PA-C  Office: [phone] 02/28/2021

## 2021-03-05 ENCOUNTER — Other Ambulatory Visit: Payer: Self-pay

## 2021-03-05 DIAGNOSIS — I739 Peripheral vascular disease, unspecified: Secondary | ICD-10-CM

## 2021-03-05 DIAGNOSIS — G5601 Carpal tunnel syndrome, right upper limb: Secondary | ICD-10-CM | POA: Diagnosis not present

## 2021-03-11 DIAGNOSIS — M65341 Trigger finger, right ring finger: Secondary | ICD-10-CM | POA: Diagnosis not present

## 2021-03-11 DIAGNOSIS — M65311 Trigger thumb, right thumb: Secondary | ICD-10-CM | POA: Diagnosis not present

## 2021-03-11 DIAGNOSIS — G5601 Carpal tunnel syndrome, right upper limb: Secondary | ICD-10-CM | POA: Diagnosis not present

## 2021-03-31 DIAGNOSIS — G5601 Carpal tunnel syndrome, right upper limb: Secondary | ICD-10-CM | POA: Diagnosis not present

## 2021-03-31 DIAGNOSIS — M65311 Trigger thumb, right thumb: Secondary | ICD-10-CM | POA: Diagnosis not present

## 2021-03-31 DIAGNOSIS — M65341 Trigger finger, right ring finger: Secondary | ICD-10-CM | POA: Diagnosis not present

## 2021-03-31 DIAGNOSIS — G8918 Other acute postprocedural pain: Secondary | ICD-10-CM | POA: Diagnosis not present

## 2021-03-31 HISTORY — DX: Trigger thumb, right thumb: M65.311

## 2021-04-17 DIAGNOSIS — G5601 Carpal tunnel syndrome, right upper limb: Secondary | ICD-10-CM | POA: Diagnosis not present

## 2021-05-01 DIAGNOSIS — Z4789 Encounter for other orthopedic aftercare: Secondary | ICD-10-CM | POA: Insufficient documentation

## 2021-05-01 HISTORY — DX: Encounter for other orthopedic aftercare: Z47.89

## 2021-07-03 DIAGNOSIS — G5601 Carpal tunnel syndrome, right upper limb: Secondary | ICD-10-CM | POA: Diagnosis not present

## 2021-07-03 DIAGNOSIS — M65341 Trigger finger, right ring finger: Secondary | ICD-10-CM | POA: Diagnosis not present

## 2021-07-03 DIAGNOSIS — M65311 Trigger thumb, right thumb: Secondary | ICD-10-CM | POA: Diagnosis not present

## 2021-07-03 DIAGNOSIS — Z4789 Encounter for other orthopedic aftercare: Secondary | ICD-10-CM | POA: Diagnosis not present

## 2021-08-14 DIAGNOSIS — G5601 Carpal tunnel syndrome, right upper limb: Secondary | ICD-10-CM | POA: Diagnosis not present

## 2021-08-14 DIAGNOSIS — M65341 Trigger finger, right ring finger: Secondary | ICD-10-CM | POA: Diagnosis not present

## 2021-08-14 DIAGNOSIS — M65311 Trigger thumb, right thumb: Secondary | ICD-10-CM | POA: Diagnosis not present

## 2021-09-03 ENCOUNTER — Other Ambulatory Visit: Payer: Self-pay

## 2021-09-03 ENCOUNTER — Ambulatory Visit (HOSPITAL_COMMUNITY)
Admission: RE | Admit: 2021-09-03 | Discharge: 2021-09-03 | Disposition: A | Discharge status: Home / Self Care | Payer: Medicaid Other | Source: Ambulatory Visit | Attending: Vascular Surgery | Admitting: Vascular Surgery

## 2021-09-03 ENCOUNTER — Ambulatory Visit: Payer: Medicaid Other | Admitting: Physician Assistant

## 2021-09-03 ENCOUNTER — Ambulatory Visit (INDEPENDENT_AMBULATORY_CARE_PROVIDER_SITE_OTHER)
Admission: RE | Admit: 2021-09-03 | Discharge: 2021-09-03 | Disposition: A | Discharge status: Home / Self Care | Payer: Medicaid Other | Source: Ambulatory Visit | Attending: Vascular Surgery | Admitting: Vascular Surgery

## 2021-09-03 VITALS — BP 201/118 | HR 84 | Temp 97.7°F | Resp 14 | Ht 60.0 in | Wt 184.6 lb

## 2021-09-03 DIAGNOSIS — F17200 Nicotine dependence, unspecified, uncomplicated: Secondary | ICD-10-CM | POA: Diagnosis not present

## 2021-09-03 DIAGNOSIS — I739 Peripheral vascular disease, unspecified: Secondary | ICD-10-CM | POA: Diagnosis not present

## 2021-09-03 NOTE — Progress Notes (Addendum)
HISTORY AND PHYSICAL     CC:  follow up. Requesting Provider:  No ref. provider found  HPI: This is a 40 y.o. female who is here today for follow up for PAD.  She has hx of stent to the left CIA and EIA on 08/19/2020 by Dr. Randie Heinz for LLE CLI with rest pain.   The pt returns today for follow up.  She states that she has some cramping in her legs when she doesn't take her medicine.  Once she takes her medicine, this resolves.  This does not happen all the time.  She does not have any non healing wounds or rest pain.  She continues to smoke.  She states that her brother died March 19, 2023 and her aunt died last week.  Her blood pressure is up today and she has not taken her BP medication today.    The pt is  on a statin for cholesterol management.    The pt is not on an aspirin.    Other AC:  Plavix The pt is on CCB for hypertension.  The pt does not have diabetes. Tobacco hx:  current  Pt does not have family hx of AAA.  Past Medical History:  Diagnosis Date   Dysmenorrhea    Hypertension     Past Surgical History:  Procedure Laterality Date   ABDOMINAL AORTOGRAM W/LOWER EXTREMITY Bilateral 08/19/2020   Procedure: ABDOMINAL AORTOGRAM W/LOWER EXTREMITY;  Surgeon: Maeola Harman, MD;  Location: [facility] INVASIVE CV LAB;  Service: Cardiovascular;  Laterality: Bilateral;   CHOLECYSTECTOMY     PERIPHERAL VASCULAR INTERVENTION Left 08/19/2020   Procedure: PERIPHERAL VASCULAR INTERVENTION;  Surgeon: Maeola Harman, MD;  Location: [facility] INVASIVE CV LAB;  Service: Cardiovascular;  Laterality: Left;  Common iliac   TUBAL LIGATION      No Known Allergies  Current Outpatient Medications  Medication Sig Dispense Refill   amLODipine (NORVASC) 10 MG tablet Take 1 tablet (10 mg total) by mouth daily. 30 tablet 0   Capsaicin-Menthol-Methyl Sal (CAPSAICIN-METHYL SAL-MENTHOL) 0.025-1-12 % CREA Apply 1 application topically every 6 (six) hours as needed. Rubbed onto your abdomen every 6 hours  if needed for pain and nausea 56.6 g 1   clopidogrel (PLAVIX) 75 MG tablet Take 1 tablet (75 mg total) by mouth daily. 30 tablet 11   HYDROcodone-acetaminophen (NORCO/VICODIN) 5-325 MG tablet Take 1 tablet by mouth every 6 (six) hours.     naproxen (NAPROSYN) 375 MG tablet Take 1 tablet twice daily as needed for hand pain. 30 tablet 0   rosuvastatin (CRESTOR) 10 MG tablet Take 1 tablet (10 mg total) by mouth daily. 30 tablet 11   No current facility-administered medications for this visit.    Family History  Problem Relation Age of Onset   Hypertension Other    Cancer Other     Social History   Socioeconomic History   Marital status: Single    Spouse name: Not on file   Number of children: Not on file   Years of education: Not on file   Highest education level: Not on file  Occupational History   Not on file  Tobacco Use   Smoking status: Every Day    Types: Cigars   Smokeless tobacco: Never  Vaping Use   Vaping Use: Some days   Substances: Nicotine  Substance and Sexual Activity   Alcohol use: No   Drug use: Not Currently    Types: Marijuana   Sexual activity: Never    Birth control/protection:  Surgical  Other Topics Concern   Not on file  Social History Narrative   Not on file   Social Determinants of Health   Financial Resource Strain: Not on file  Food Insecurity: Not on file  Transportation Needs: Not on file  Physical Activity: Not on file  Stress: Not on file  Social Connections: Not on file  Intimate Partner Violence: Not on file     REVIEW OF SYSTEMS:   [X]  denotes positive finding, [ ]  denotes negative finding Cardiac  Comments:  Chest pain or chest pressure:    Shortness of breath upon exertion:    Short of breath when lying flat:    Irregular heart rhythm:        Vascular    Pain in calf, thigh, or hip brought on by ambulation:    Pain in feet at night that wakes you up from your sleep:     Blood clot in your veins:    Leg swelling:  x        Pulmonary    Oxygen at home:    Productive cough:     Wheezing:         Neurologic    Sudden weakness in arms or legs:     Sudden numbness in arms or legs:     Sudden onset of difficulty speaking or slurred speech:    Temporary loss of vision in one eye:     Problems with dizziness:         Gastrointestinal    Blood in stool:     Vomited blood:         Genitourinary    Burning when urinating:     Blood in urine:        Psychiatric    Major depression:         Hematologic    Bleeding problems:    Problems with blood clotting too easily:        Skin    Rashes or ulcers:        Constitutional    Fever or chills:      PHYSICAL EXAMINATION:  Today's Vitals   09/03/21 1006 09/03/21 1009  BP: (!) 201/118   Pulse: 84   Resp: 14   Temp: 97.7 F (36.5 C)   TempSrc: Temporal   SpO2: 100%   Weight: 184 lb 9.6 oz (83.7 kg)   Height: 5' (1.524 m)   PainSc: 0-No pain 0-No pain   Body mass index is 36.05 kg/m.   General:  WDWN in NAD; vital signs documented above Gait: Not observed HENT: WNL, normocephalic Pulmonary: normal non-labored breathing , without wheezing Cardiac: regular HR, without Murmur; without carotid bruits Abdomen: soft, NT, no masses; aortic pulse is not palpable Skin: without rashes Vascular Exam/Pulses:  Right Left  Radial 2+ (normal) 2+ (normal)  Femoral Difficult to palpate due to body habitus Difficult to palpate due to body habitus  Popliteal Unable to palpate Unable to palpate  DP 2+ (normal) 2+ (normal)  PT 2+ (normal) 2+ (normal)   Extremities: without ischemic changes, without Gangrene , without cellulitis; without open wounds;  Musculoskeletal: no muscle wasting or atrophy  Neurologic: A&O X 3 Psychiatric:  The pt has Normal affect.   Non-Invasive Vascular Imaging:   ABI's/TBI's on 09/03/2021: Right:  1.10/0.93 - Great toe pressure: 179 Left:  1.05/0.75 - Great toe pressure: 144  Arterial duplex on 09/03/2021: Left  Stent(s):  +---------------+---++---------++  Prox to Stent  114triphasic  +---------------+---++---------++  Proximal Stent 111triphasic  +---------------+---++---------++  Mid Stent      132triphasic  +---------------+---++---------++  Distal Stent   152triphasic  +---------------+---++---------++  Distal to Stent127triphasic  +---------------+---++---------++   Left CFA 148 cm/s triphasic waveform. Distal aorta 78 cm/s triphasic  waveform.   Summary:  IVC/Iliac: Patent left CIA/EIA stent with no visualized stenosis    ASSESSMENT/PLAN:: 40 y.o. female here for follow up for PAD with hx of stent to the left CIA and EIA on 08/19/2020 by Dr. Randie Heinz for LLE CLI with rest pain.  PAD -pt doing well without claudication, rest pain or non healing wounds.  She has palpable pedal pulses.  Her iliac stents are patent.  -pt will f/u in one year with ABI and aortoiliac duplex. -continue plavix/statin -discussed elevating legs for leg swelling  Current smoker -discussed importance of smoking cessation with pt.  She states she has stopped before but given the recent deaths in her family, her stress levels have been elevated.  She wants to quit.   Hypertension -pt's pressure elevated today.  She has not taken her antihypertensive today.  Discussed that she needs to take this and get BP under control that she is at risk for stroke with elevated pressure.  She expressed understanding.   Doreatha Massed, [facility] [phone]  Clinic MD:   Randie Heinz

## 2022-02-18 ENCOUNTER — Encounter (HOSPITAL_BASED_OUTPATIENT_CLINIC_OR_DEPARTMENT_OTHER): Payer: Self-pay

## 2022-02-18 ENCOUNTER — Other Ambulatory Visit: Payer: Self-pay

## 2022-02-18 ENCOUNTER — Emergency Department (HOSPITAL_BASED_OUTPATIENT_CLINIC_OR_DEPARTMENT_OTHER)
Admission: EM | Admit: 2022-02-18 | Discharge: 2022-02-18 | Disposition: A | Discharge status: Home / Self Care | Payer: 59 | Attending: Emergency Medicine | Admitting: Emergency Medicine

## 2022-02-18 DIAGNOSIS — M7989 Other specified soft tissue disorders: Secondary | ICD-10-CM | POA: Diagnosis not present

## 2022-02-18 DIAGNOSIS — I10 Essential (primary) hypertension: Secondary | ICD-10-CM | POA: Insufficient documentation

## 2022-02-18 DIAGNOSIS — Z79899 Other long term (current) drug therapy: Secondary | ICD-10-CM | POA: Insufficient documentation

## 2022-02-18 DIAGNOSIS — M79641 Pain in right hand: Secondary | ICD-10-CM | POA: Diagnosis present

## 2022-02-18 DIAGNOSIS — M6530 Trigger finger, unspecified finger: Secondary | ICD-10-CM | POA: Insufficient documentation

## 2022-02-18 MED ORDER — PREDNISONE 20 MG PO TABS: ORAL_TABLET | ORAL | 0 refills | Status: DC

## 2022-02-18 MED ORDER — IBUPROFEN 800 MG PO TABS
800.0000 mg | ORAL_TABLET | Freq: Once | ORAL | Status: AC
  Administered 2022-02-18: 800 mg via ORAL
  Filled 2022-02-18: qty 1

## 2022-02-18 MED ORDER — PREDNISONE 50 MG PO TABS
60.0000 mg | ORAL_TABLET | Freq: Once | ORAL | Status: AC
  Administered 2022-02-18: 60 mg via ORAL
  Filled 2022-02-18: qty 1

## 2022-02-18 MED ORDER — OXYCODONE-ACETAMINOPHEN 5-325 MG PO TABS: 1.0000 | ORAL_TABLET | Freq: Four times a day (QID) | ORAL | 0 refills | Status: DC | PRN

## 2022-02-18 NOTE — ED Triage Notes (Signed)
Pt c/o pain and decreased ROM to right middle and ring finger x 2 weeks-denies injury-NAD-steady gait ?

## 2022-02-18 NOTE — ED Provider Notes (Signed)
?[facility] EMERGENCY DEPARTMENT ?Provider Note ? ? ?CSN: [id number] ?Arrival date & time: 02/18/22  1829 ? ?  ? ?History ? ?Chief Complaint  ?Patient presents with  ? Hand Pain  ? ? ?Mishell Donalson is a 41 y.o. female here presenting with right hand pain.  Patient has history of carpal tunnel and trigger finger and had surgery a year ago with Dr. Orlan Leavens.  Patient states that for the last year she has noticed that sometimes in the mornings, her hand was swollen and she has to flex her finger in order to control the pain.  She took some ibuprofen last week and that helped with the pain.  Denies any trauma or injury or fever ? ?The history is provided by the patient.  ? ?  ? ?Home Medications ?Prior to Admission medications   ?Medication Sig Start Date End Date Taking? Authorizing Provider  ?amLODipine (NORVASC) 10 MG tablet Take 1 tablet (10 mg total) by mouth daily. 08/06/20   Jeannie Fend, PA-C  ?Capsaicin-Menthol-Methyl Sal (CAPSAICIN-METHYL SAL-MENTHOL) 0.025-1-12 % CREA Apply 1 application topically every 6 (six) hours as needed. Rubbed onto your abdomen every 6 hours if needed for pain and nausea 06/23/19   Arby Barrette, MD  ?HYDROcodone-acetaminophen (NORCO/VICODIN) 5-325 MG tablet Take 1 tablet by mouth every 6 (six) hours. 02/24/21   [provider]  ?naproxen (NAPROSYN) 375 MG tablet Take 1 tablet twice daily as needed for hand pain. 02/23/21   Molpus, John, MD  ?rosuvastatin (CRESTOR) 10 MG tablet Take 1 tablet (10 mg total) by mouth daily. 02/04/21 02/04/22  Horton, Mayer Masker, MD  ?promethazine (PHENERGAN) 25 MG tablet Take 1 tablet (25 mg total) by mouth every 6 (six) hours as needed for nausea or vomiting. 06/23/19 08/06/20  Arby Barrette, MD  ?   ? ?Allergies    ?Patient has no known allergies.   ? ?Review of Systems   ?Review of Systems  ?Musculoskeletal:   ?     Right hand pain  ?All other systems reviewed and are negative. ? ?Physical Exam ?Updated Vital Signs ?BP (!) 157/104 (BP  Location: Left Arm)   Pulse 81   Temp 98.5 ?F (36.9 ?C) (Oral)   Resp 18   Ht 5' (1.524 m)   Wt 79.4 kg   LMP 02/04/2022   SpO2 100%   BMI 34.18 kg/m?  ?Physical Exam ?Vitals and nursing note reviewed.  ?HENT:  ?   Head: Normocephalic.  ?   Nose: Nose normal.  ?Eyes:  ?   Pupils: Pupils are equal, round, and reactive to light.  ?Cardiovascular:  ?   Rate and Rhythm: Normal rate.  ?   Pulses: Normal pulses.  ?Pulmonary:  ?   Effort: Pulmonary effort is normal.  ?Abdominal:  ?   General: Abdomen is flat.  ?Musculoskeletal:  ?   Cervical back: Normal range of motion.  ?   Comments: Patient has carpal tunnel scar and minimal pain over that scar.  Patient does have trigger finger release scar on the right thumb and third and fourth finger.  Patient has some tenderness over the right third MCP joint.  Patient is able to flex and extend all the digits.  There is no obvious cellulitis.  Patient has 2+ radial pulse and normal capillary refill  ?Skin: ?   Capillary Refill: Capillary refill takes less than 2 seconds.  ?Neurological:  ?   General: No focal deficit present.  ?   Mental Status: She is alert  and oriented to person, place, and time.  ?Psychiatric:     ?   Mood and Affect: Mood normal.     ?   Behavior: Behavior normal.  ? ? ?ED Results / Procedures / Treatments   ?Labs ?(all labs ordered are listed, but only abnormal results are displayed) ?Labs Reviewed - No data to display ? ?EKG ?None ? ?Radiology ?No results found. ? ?Procedures ?Procedures  ? ? ?Medications Ordered in ED ?Medications  ?ibuprofen (ADVIL) tablet 800 mg (has no administration in time range)  ?predniSONE (DELTASONE) tablet 60 mg (has no administration in time range)  ? ? ?ED Course/ Medical Decision Making/ A&P ?  ?                        ?Medical Decision Making ?Brooke Steinhilber is a 41 y.o. female here presenting with right hand pain.  Patient has previous carpal tunnel surgery and trigger finger release.  Patient has pain at the same  site.  I wonder if she has recurrent trigger finger.  Patient has no trauma and will not need x-rays at this time.  Patient is also neurovascular intact.  We will give a course of steroids and pain medicine.  We will have her follow-up with Dr. Orlan Leavens again ? ? ?Problems Addressed: ?Trigger finger of right hand, unspecified finger: acute illness or injury ? ?Risk ?Prescription drug management. ? ?Final Clinical Impression(s) / ED Diagnoses ?Final diagnoses:  ?None  ? ? ?Rx / DC Orders ?ED Discharge Orders   ? ? None  ? ?  ? ? ?  ?Charlynne Pander, MD ?02/18/22 2100 ? ?

## 2022-02-18 NOTE — Discharge Instructions (Addendum)
Take prednisone as prescribed. ? ?Also take Tylenol or Motrin for pain ? ?Take Percocet for severe pain ? ?Please follow-up with Dr. Apolonio Schneiders regarding your trigger finger and carpal tunnel syndrome ? ?Return to ER if you have worse pain in your hand, hand swelling ?

## 2022-02-19 ENCOUNTER — Telehealth: Payer: Self-pay

## 2022-02-19 DIAGNOSIS — M65331 Trigger finger, right middle finger: Secondary | ICD-10-CM | POA: Diagnosis not present

## 2022-02-19 NOTE — Telephone Encounter (Signed)
Transition Care Management Unsuccessful Follow-up Telephone Call ? ?Date of discharge and from where:  02/18/2022 from [facility] ? ?Attempts:  1st Attempt ? ?Reason for unsuccessful TCM follow-up call:  Left voice message ? ? ? ?

## 2022-02-20 NOTE — Telephone Encounter (Signed)
Transition Care Management Unsuccessful Follow-up Telephone Call ? ?Date of discharge and from where:  02/18/2022 from [facility] ? ?Attempts:  2nd Attempt ? ?Reason for unsuccessful TCM follow-up call:  Voice mail full ? ? ? ?

## 2022-02-23 NOTE — Telephone Encounter (Signed)
Transition Care Management Unsuccessful Follow-up Telephone Call ? ?Date of discharge and from where:  02/18/2022 from [facility] ? ?Attempts:  3rd Attempt ? ?Reason for unsuccessful TCM follow-up call:  Unable to reach patient ? ? ? ?

## 2022-02-27 DIAGNOSIS — R079 Chest pain, unspecified: Secondary | ICD-10-CM | POA: Diagnosis not present

## 2022-02-27 DIAGNOSIS — I10 Essential (primary) hypertension: Secondary | ICD-10-CM | POA: Diagnosis not present

## 2022-02-27 DIAGNOSIS — R0789 Other chest pain: Secondary | ICD-10-CM | POA: Diagnosis not present

## 2022-02-27 DIAGNOSIS — R42 Dizziness and giddiness: Secondary | ICD-10-CM | POA: Diagnosis not present

## 2022-03-03 ENCOUNTER — Emergency Department (HOSPITAL_BASED_OUTPATIENT_CLINIC_OR_DEPARTMENT_OTHER): Payer: 59

## 2022-03-03 ENCOUNTER — Other Ambulatory Visit: Payer: Self-pay

## 2022-03-03 ENCOUNTER — Encounter (HOSPITAL_BASED_OUTPATIENT_CLINIC_OR_DEPARTMENT_OTHER): Payer: Self-pay | Admitting: Emergency Medicine

## 2022-03-03 ENCOUNTER — Ambulatory Visit: Payer: Self-pay | Admitting: *Deleted

## 2022-03-03 ENCOUNTER — Emergency Department (HOSPITAL_BASED_OUTPATIENT_CLINIC_OR_DEPARTMENT_OTHER)
Admission: EM | Admit: 2022-03-03 | Discharge: 2022-03-03 | Disposition: A | Discharge status: Home / Self Care | Payer: 59 | Attending: Emergency Medicine | Admitting: Emergency Medicine

## 2022-03-03 DIAGNOSIS — Z79899 Other long term (current) drug therapy: Secondary | ICD-10-CM | POA: Diagnosis not present

## 2022-03-03 DIAGNOSIS — N6452 Nipple discharge: Secondary | ICD-10-CM | POA: Diagnosis not present

## 2022-03-03 DIAGNOSIS — R0789 Other chest pain: Secondary | ICD-10-CM | POA: Diagnosis not present

## 2022-03-03 DIAGNOSIS — M25562 Pain in left knee: Secondary | ICD-10-CM

## 2022-03-03 DIAGNOSIS — I10 Essential (primary) hypertension: Secondary | ICD-10-CM | POA: Diagnosis not present

## 2022-03-03 LAB — CBC
HCT: 39.1 % (ref 36.0–46.0)
Hemoglobin: 13.3 g/dL (ref 12.0–15.0)
MCH: 32.2 pg (ref 26.0–34.0)
MCHC: 34 g/dL (ref 30.0–36.0)
MCV: 94.7 fL (ref 80.0–100.0)
Platelets: 310 10*3/uL (ref 150–400)
RBC: 4.13 MIL/uL (ref 3.87–5.11)
RDW: 13.4 % (ref 11.5–15.5)
WBC: 8 10*3/uL (ref 4.0–10.5)
nRBC: 0 % (ref 0.0–0.2)

## 2022-03-03 LAB — BASIC METABOLIC PANEL
Anion gap: 5 (ref 5–15)
BUN: 10 mg/dL (ref 6–20)
CO2: 24 mmol/L (ref 22–32)
Calcium: 8.9 mg/dL (ref 8.9–10.3)
Chloride: 108 mmol/L (ref 98–111)
Creatinine, Ser: 0.81 mg/dL (ref 0.44–1.00)
GFR, Estimated: >60 mL/min (ref >60)
Glucose, Bld: 106 mg/dL — ABNORMAL HIGH (ref 70–99)
Potassium: 4.2 mmol/L (ref 3.5–5.1)
Sodium: 137 mmol/L (ref 135–145)

## 2022-03-03 LAB — TROPONIN I (HIGH SENSITIVITY): Troponin I (High Sensitivity): 4 ng/L (ref <18)

## 2022-03-03 LAB — PREGNANCY, URINE: Preg Test, Ur: NEGATIVE

## 2022-03-03 MED ORDER — AMLODIPINE BESYLATE 5 MG PO TABS
10.0000 mg | ORAL_TABLET | Freq: Once | ORAL | Status: AC
  Administered 2022-03-03: 10 mg via ORAL
  Filled 2022-03-03: qty 2

## 2022-03-03 MED ORDER — AMLODIPINE BESYLATE 10 MG PO TABS: 10.0000 mg | ORAL_TABLET | Freq: Every day | ORAL | 1 refills | Status: AC

## 2022-03-03 MED ORDER — ACETAMINOPHEN 325 MG PO TABS
650.0000 mg | ORAL_TABLET | Freq: Once | ORAL | Status: AC
  Administered 2022-03-03: 650 mg via ORAL
  Filled 2022-03-03: qty 2

## 2022-03-03 NOTE — Telephone Encounter (Signed)
? ?  Chief Complaint: finding PCP ?Symptoms: ED today pleuritic pain ?Frequency: na ?Pertinent Negatives: Patient denies having any doctor already established with ?Disposition: [] ED /[] Urgent Care (no appt availability in office) / [x] Appointment(In office/virtual)/ []  [facility]/ [] Home Care/ [] Refused Recommended Disposition /[] [facility]/ []  Follow-up with PCP ?Additional Notes: Seen in ED, pleuritic pain and nipple discharge per ED note. Pt called because ED MD told her she should be seen by a primary care doctor for follow up. ? ?Answer Assessment - Initial Assessment Questions ?1. REASON FOR CALL or QUESTION: "What is your reason for calling today?" or "How can I best help you?" or "What question do you have that I can help answer?" ?    Seen in ED and was told to see a primary care doctor. Appt made with Cari Mayers at [facility] ? ?Protocols used: Information Only Call - No Triage-A-AH ? ?

## 2022-03-03 NOTE — ED Triage Notes (Signed)
Left breast pain since last week has had clear fluid from nipple and it has been  sore  chest pain  some sob states  left leg swollen ?

## 2022-03-03 NOTE — ED Notes (Signed)
Pt at X-Ray, unable to collect urine specimen at this time.  ?

## 2022-03-03 NOTE — Discharge Instructions (Addendum)
As we discussed, your work-up in the ER was reassuring for acute abnormalities.  I have refilled your blood pressure medication as it is quite high today.  Please take this as prescribed.  I have also given you a referral to the wellness center with a number to call to schedule an appointment for further evaluation and management of your symptoms. ? ?Return if development of any new or worsening symptoms ?

## 2022-03-03 NOTE — ED Notes (Signed)
Pt states she has had this breast pain for a week , has had leg pain  and sshe has beenout of her BP meds x 1 year , ?

## 2022-03-03 NOTE — ED Provider Notes (Signed)
?[facility] EMERGENCY DEPARTMENT ?Provider Note ? ? ?CSN: [id number] ?Arrival date & time: 03/03/22  [id number] ? ?  ? ?History ? ?No chief complaint on file. ? ? ?Janice French is a 41 y.o. female. ? ?Patient with history of hypertension presents today with complaints of chest pain.  She states that same began last Thursday and has been intermittent since then without discernible triggers.  She states that the pain is reproducible to palpation and pleuritic in nature and radiates 'all throughout my chest.' She states that she has had clear intermittent nipple discharge from her left breat since onset of symptoms.  She also states that since yesterday she has been having left knee and calf pain.  Additionally, patient states that she has been out of her blood pressure medication for the past year. Of note, patient is an everyday smoker, denies recreational drug use or IVDU. Denies history of blood clots or exogenous estrogen use. No recent travel or surgeries. ? ?The history is provided by the patient. No language interpreter was used.  ? ?  ? ?Home Medications ?Prior to Admission medications   ?Medication Sig Start Date End Date Taking? Authorizing Provider  ?amLODipine (NORVASC) 10 MG tablet Take 1 tablet (10 mg total) by mouth daily. 08/06/20   Tacy Learn, PA-C  ?Capsaicin-Menthol-Methyl Sal (CAPSAICIN-METHYL SAL-MENTHOL) 0.025-1-12 % CREA Apply 1 application topically every 6 (six) hours as needed. Rubbed onto your abdomen every 6 hours if needed for pain and nausea 06/23/19   Charlesetta Shanks, MD  ?HYDROcodone-acetaminophen (NORCO/VICODIN) 5-325 MG tablet Take 1 tablet by mouth every 6 (six) hours. 02/24/21   [provider]  ?naproxen (NAPROSYN) 375 MG tablet Take 1 tablet twice daily as needed for hand pain. 02/23/21   Molpus, John, MD  ?oxyCODONE-acetaminophen (PERCOCET/ROXICET) 5-325 MG tablet Take 1 tablet by mouth every 6 (six) hours as needed for severe pain. 02/18/22   Drenda Freeze, MD   ?predniSONE (DELTASONE) 20 MG tablet Take 60 mg daily x 2 days then 40 mg daily x 2 days then 20 mg daily x 2 days 02/18/22   Drenda Freeze, MD  ?rosuvastatin (CRESTOR) 10 MG tablet Take 1 tablet (10 mg total) by mouth daily. 02/04/21 02/04/22  Horton, Barbette Hair, MD  ?promethazine (PHENERGAN) 25 MG tablet Take 1 tablet (25 mg total) by mouth every 6 (six) hours as needed for nausea or vomiting. 06/23/19 08/06/20  Charlesetta Shanks, MD  ?   ? ?Allergies    ?Patient has no known allergies.   ? ?Review of Systems   ?Review of Systems  ?Constitutional:  Negative for chills and fever.  ?Cardiovascular:  Positive for chest pain.  ?All other systems reviewed and are negative. ? ?Physical Exam ?Updated Vital Signs ?BP (!) 196/105 (BP Location: Right Arm)   Pulse 88   Temp 98.4 ?F (36.9 ?C)   Resp 19   Ht 5' (1.524 m)   Wt 79.7 kg   LMP 02/04/2022   SpO2 99%   BMI 34.30 kg/m?  ?Physical Exam ?Vitals and nursing note reviewed.  ?Constitutional:   ?   General: She is not in acute distress. ?   Appearance: Normal appearance. She is normal weight. She is not ill-appearing, toxic-appearing or diaphoretic.  ?HENT:  ?   Head: Normocephalic and atraumatic.  ?Eyes:  ?   Extraocular Movements: Extraocular movements intact.  ?   Pupils: Pupils are equal, round, and reactive to light.  ?Cardiovascular:  ?   Rate and Rhythm:  Normal rate and regular rhythm.  ?   Pulses: Normal pulses.  ?   Heart sounds: Normal heart sounds.  ?   Comments: No nipple discharge visualized or overlying skin changes noted to either breast.  No lumps or other abnormalities palpated on breast exam. ?Pulmonary:  ?   Effort: Pulmonary effort is normal. No respiratory distress.  ?   Breath sounds: Normal breath sounds.  ?Abdominal:  ?   General: Abdomen is flat.  ?   Palpations: Abdomen is soft.  ?Musculoskeletal:     ?   General: Normal range of motion.  ?   Cervical back: Normal range of motion.  ?   Comments: Tenderness to palpation of posterior left  knee. No swelling, erythema, warmth  ?Skin: ?   General: Skin is warm and dry.  ?Neurological:  ?   General: No focal deficit present.  ?   Mental Status: She is alert.  ?Psychiatric:     ?   Mood and Affect: Mood normal.     ?   Behavior: Behavior normal.  ? ? ?ED Results / Procedures / Treatments   ?Labs ?(all labs ordered are listed, but only abnormal results are displayed) ?Labs Reviewed  ?BASIC METABOLIC PANEL - Abnormal; Notable for the following components:  ?    Result Value  ? Glucose, Bld 106 (*)   ? All other components within normal limits  ?CBC  ?PREGNANCY, URINE  ?TROPONIN I (HIGH SENSITIVITY)  ?TROPONIN I (HIGH SENSITIVITY)  ? ? ?EKG ?None ? ?Radiology ?DG Chest 2 View ? ?Result Date: 03/03/2022 ?CLINICAL DATA:  Chest pain. EXAM: CHEST - 2 VIEW COMPARISON:  Chest radiographs 12/12/2018. FINDINGS: No consolidation. No visible pleural effusions or pneumothorax. Borderline enlargement the cardiac silhouette, similar. No evidence of acute osseous abnormality. IMPRESSION: No evidence of acute cardiopulmonary disease. Electronically Signed   By: Margaretha Sheffield M.D.   On: 03/03/2022 09:50  ? ?US Venous Img Lower  Left (DVT Study) ? ?Result Date: 03/03/2022 ?CLINICAL DATA:  Sudden onset LEFT leg pain EXAM: LEFT LOWER EXTREMITY VENOUS DOPPLER ULTRASOUND TECHNIQUE: Gray-scale sonography with compression, as well as color and duplex ultrasound, were performed to evaluate the deep venous system(s) from the level of the common femoral vein through the popliteal and proximal calf veins. COMPARISON:  None FINDINGS: VENOUS Normal compressibility of the common femoral, superficial femoral, and popliteal veins, as well as the visualized calf veins. Visualized portions of profunda femoral vein and great saphenous vein unremarkable. No filling defects to suggest DVT on grayscale or color Doppler imaging. Doppler waveforms show normal direction of venous flow, normal respiratory plasticity and response to augmentation.  Limited views of the contralateral common femoral vein are unremarkable. OTHER None. Limitations: none IMPRESSION: No evidence of deep venous thrombosis in the LEFT lower extremity. Electronically Signed   By: Lavonia Dana M.D.   On: 03/03/2022 10:08   ? ?Procedures ?Procedures  ? ? ?Medications Ordered in ED ?Medications  ?amLODipine (NORVASC) tablet 10 mg (10 mg Oral Given 03/03/22 1015)  ?acetaminophen (TYLENOL) tablet 650 mg (650 mg Oral Given 03/03/22 1208)  ? ? ?ED Course/ Medical Decision Making/ A&P ?  ?                        ?Medical Decision Making ?Amount and/or Complexity of Data Reviewed ?Labs: ordered. ?Radiology: ordered. ? ?Risk ?OTC drugs. ?Prescription drug management. ? ? ?This patient presents to the ED for concern of chest pain,  this involves an extensive number of treatment options, and is a complaint that carries with it a high risk of complications and morbidity. ? ? ?Co morbidities that complicate the patient evaluation ? ?Hx uncontrolled htn ? ? ?Lab Tests: ? ?I Ordered, and personally interpreted labs.  The pertinent results include: Troponin negative, no acute laboratory findings ? ? ?Imaging Studies ordered: ? ?I ordered imaging studies including CXR, DVT  ?I independently visualized and interpreted imaging which showed DVT negative, no acute cardiopulmonary disease ?I agree with the radiologist interpretation ? ? ?Cardiac Monitoring: / EKG: ? ?The patient was maintained on a cardiac monitor.  I personally viewed and interpreted the cardiac monitored which showed an underlying rhythm of: sinus rhythm ? ?Given the large differential diagnosis for Jolicia Marchi, the decision making in this case is of high complexity. ? ?After evaluating all of the data points in this case, the presentation of Amilyn Boblitt is NOT consistent with Acute Coronary Syndrome (ACS) and/or myocardial ischemia, pulmonary embolism, aortic dissection; Borhaave's, significant arrythmia, pneumothorax, cardiac  tamponade, or other emergent cardiopulmonary condition. ? ?Further, the presentation of Jahari Shoenfelt is NOT consistent with pericarditis, myocarditis, cholecystitis, pancreatitis, mediastinitis, endocarditis, new val

## 2022-03-04 ENCOUNTER — Ambulatory Visit (INDEPENDENT_AMBULATORY_CARE_PROVIDER_SITE_OTHER): Payer: 59 | Admitting: Physician Assistant

## 2022-03-04 ENCOUNTER — Encounter: Payer: Self-pay | Admitting: Physician Assistant

## 2022-03-04 VITALS — BP 188/102 | HR 92 | Temp 98.8°F | Resp 18 | Ht 60.0 in | Wt 169.0 lb

## 2022-03-04 DIAGNOSIS — F17200 Nicotine dependence, unspecified, uncomplicated: Secondary | ICD-10-CM

## 2022-03-04 DIAGNOSIS — G5601 Carpal tunnel syndrome, right upper limb: Secondary | ICD-10-CM

## 2022-03-04 DIAGNOSIS — R7303 Prediabetes: Secondary | ICD-10-CM | POA: Diagnosis not present

## 2022-03-04 DIAGNOSIS — R1013 Epigastric pain: Secondary | ICD-10-CM | POA: Diagnosis not present

## 2022-03-04 DIAGNOSIS — I160 Hypertensive urgency: Secondary | ICD-10-CM

## 2022-03-04 DIAGNOSIS — G4700 Insomnia, unspecified: Secondary | ICD-10-CM

## 2022-03-04 DIAGNOSIS — Z13220 Encounter for screening for lipoid disorders: Secondary | ICD-10-CM

## 2022-03-04 DIAGNOSIS — M6530 Trigger finger, unspecified finger: Secondary | ICD-10-CM

## 2022-03-04 DIAGNOSIS — I10 Essential (primary) hypertension: Secondary | ICD-10-CM

## 2022-03-04 DIAGNOSIS — Z114 Encounter for screening for human immunodeficiency virus [HIV]: Secondary | ICD-10-CM

## 2022-03-04 DIAGNOSIS — Z1159 Encounter for screening for other viral diseases: Secondary | ICD-10-CM

## 2022-03-04 LAB — POCT GLYCOSYLATED HEMOGLOBIN (HGB A1C): Hemoglobin A1C: 6.4 % — AB (ref 4.0–5.6)

## 2022-03-04 MED ORDER — CLONIDINE HCL 0.1 MG PO TABS
0.1000 mg | ORAL_TABLET | Freq: Once | ORAL | Status: AC
Start: 2022-03-04 — End: 2022-03-04
  Administered 2022-03-04: 0.1 mg via ORAL

## 2022-03-04 MED ORDER — OMEPRAZOLE 20 MG PO CPDR: 20.0000 mg | DELAYED_RELEASE_CAPSULE | Freq: Every day | ORAL | 3 refills | Status: AC

## 2022-03-04 MED ORDER — HYDROCHLOROTHIAZIDE 25 MG PO TABS: 25.0000 mg | ORAL_TABLET | Freq: Every day | ORAL | 1 refills | Status: AC

## 2022-03-04 NOTE — Patient Instructions (Signed)
In addition to the amlodipine, you are going to start taking hydrochlorothiazide 25 mg once daily. ? ?I encourage you to check your blood pressure on a daily basis, keep a written log and have available for all office visits. ? ?To help with your epigastric pain, you are going to start taking omeprazole once daily. ? ?It is very important that you work on improving your sleep, you should aim to get 7 to 8 hours of sleep at night.  You can use melatonin over-the-counter to help. ? ?We will call you with today's lab results ? ?Kennieth Rad, PA-C ?Physician Assistant ?[facility] ?[email or web address] ? ?How to Take Your Blood Pressure ?Blood pressure is a measurement of how strongly your blood is pressing against the walls of your arteries. Arteries are blood vessels that carry blood from your heart throughout your body. Your health care provider takes your blood pressure at each office visit. You can also take your own blood pressure at home with a blood pressure monitor. ?You may need to take your own blood pressure to: ?Confirm a diagnosis of high blood pressure (hypertension). ?Monitor your blood pressure over time. ?Make sure your blood pressure medicine is working. ?Supplies needed: ?Blood pressure monitor. ?Dining room chair to sit in. ?Table or desk. ?Small notebook and pencil or pen. ?How to prepare ?To get the most accurate reading, avoid the following for 30 minutes before you check your blood pressure: ?Drinking caffeine. ?Drinking alcohol. ?Eating. ?Smoking. ?Exercising. ?Five minutes before you check your blood pressure: ?Use the bathroom and urinate so that you have an empty bladder. ?Sit quietly in a dining room chair. Do not sit in a soft couch or an armchair. Do not talk. ?How to take your blood pressure ?To check your blood pressure, follow the instructions in the manual that came with your blood pressure monitor. If you have a digital blood  pressure monitor, the instructions may be as follows: ?Sit up straight in a chair. ?Place your feet on the floor. Do not cross your ankles or legs. ?Rest your left arm at the level of your heart on a table or desk or on the arm of a chair. ?Pull up your shirt sleeve. ?Wrap the blood pressure cuff around the upper part of your left arm, 1 inch (2.5 cm) above your elbow. It is best to wrap the cuff around bare skin. ?Fit the cuff snugly around your arm. You should be able to place only one finger between the cuff and your arm. ?Position the cord so that it rests in the bend of your elbow. ?Press the power button. ?Sit quietly while the cuff inflates and deflates. ?Read the digital reading on the monitor screen and write the numbers down (record them) in a notebook. ?Wait 2-3 minutes, then repeat the steps, starting at step 1. ?What does my blood pressure reading mean? ?A blood pressure reading consists of a higher number over a lower number. Ideally, your blood pressure should be below 120/80. The first ("top") number is called the systolic pressure. It is a measure of the pressure in your arteries as your heart beats. The second ("bottom") number is called the diastolic pressure. It is a measure of the pressure in your arteries as the heart relaxes. ?Blood pressure is classified into five stages. The following are the stages for adults who do not have a short-term serious illness or a chronic condition. Systolic pressure and diastolic pressure are measured in a unit called mm Hg (millimeters  of mercury).  ?Normal ?Systolic pressure: below 123456. ?Diastolic pressure: below 80. ?Elevated ?Systolic pressure: [id number]. ?Diastolic pressure: below 80. ?Hypertension stage 1 ?Systolic pressure: 0000000. ?Diastolic pressure: [id number]. ?Hypertension stage 2 ?Systolic pressure: XX123456 or above. ?Diastolic pressure: 90 or above. ?You can have elevated blood pressure or hypertension even if only the systolic or only the diastolic number  in your reading is higher than normal. ?Follow these instructions at home: ?Medicines ?Take over-the-counter and prescription medicines only as told by your health care provider. ?Tell your health care provider if you are having any side effects from blood pressure medicine. ?General instructions ?Check your blood pressure as often as recommended by your health care provider. ?Check your blood pressure at the same time every day. ?Take your monitor to the next appointment with your health care provider to make sure that: ?You are using it correctly. ?It provides accurate readings. ?Understand what your goal blood pressure numbers are. ?Keep all follow-up visits as told by your health care provider. This is important. ?General tips ?Your health care provider can suggest a reliable monitor that will meet your needs. There are several types of home blood pressure monitors. ?Choose a monitor that has an arm cuff. Do not choose a monitor that measures your blood pressure from your wrist or finger. ?Choose a cuff that wraps snugly around your upper arm. You should be able to fit only one finger between your arm and the cuff. ?You can buy a blood pressure monitor at most drugstores or online. ?Where to find more information ?American Heart Association: www.heart.org ?Contact a health care provider if: ?Your blood pressure is consistently high. ?Your blood pressure is suddenly low. ?Get help right away if: ?Your systolic blood pressure is higher than 180. ?Your diastolic blood pressure is higher than 120. ?Summary ?Blood pressure is a measurement of how strongly your blood is pressing against the walls of your arteries. ?A blood pressure reading consists of a higher number over a lower number. Ideally, your blood pressure should be below 120/80. ?Check your blood pressure at the same time every day. ?Avoid caffeine, alcohol, smoking, and exercise for 30 minutes prior to checking your blood pressure. These agents can affect  the accuracy of the blood pressure reading. ?This information is not intended to replace advice given to you by your health care provider. Make sure you discuss any questions you have with your health care provider. ?Document Revised: 09/18/2020 Document Reviewed: 11/03/2019 ?Elsevier Patient Education ? [place]. ?Insomnia ?Insomnia is a sleep disorder that makes it difficult to fall asleep or stay asleep. Insomnia can cause fatigue, low energy, difficulty concentrating, mood swings, and poor performance at work or school. ?There are three different ways to classify insomnia: ?Difficulty falling asleep. ?Difficulty staying asleep. ?Waking up too early in the morning. ?Any type of insomnia can be long-term (chronic) or short-term (acute). Both are common. Short-term insomnia usually lasts for three months or less. Chronic insomnia occurs at least three times a week for longer than three months. ?What are the causes? ?Insomnia may be caused by another condition, situation, or substance, such as: ?Anxiety. ?Certain medicines. ?Gastroesophageal reflux disease (GERD) or other gastrointestinal conditions. ?Asthma or other breathing conditions. ?Restless legs syndrome, sleep apnea, or other sleep disorders. ?Chronic pain. ?Menopause. ?Stroke. ?Abuse of alcohol, tobacco, or illegal drugs. ?Mental health conditions, such as depression. ?Caffeine. ?Neurological disorders, such as Alzheimer's disease. ?An overactive thyroid (hyperthyroidism). ?Sometimes, the cause of insomnia may not be known. ?What increases the risk? ?  Risk factors for insomnia include: ?Gender. Women are affected more often than men. ?Age. Insomnia is more common as you get older. ?Stress. ?Lack of exercise. ?Irregular work schedule or working night shifts. ?Traveling between different time zones. ?Certain medical and mental health conditions. ?What are the signs or symptoms? ?If you have insomnia, the main symptom is having trouble falling asleep  or having trouble staying asleep. This may lead to other symptoms, such as: ?Feeling fatigued or having low energy. ?Feeling nervous about going to sleep. ?Not feeling rested in the morning. ?Having trou

## 2022-03-04 NOTE — Progress Notes (Signed)
? ?New Patient Office Visit ? ?Subjective:  ?Patient ID: Janice French, female    DOB: [birth year]  Age: 41 y.o. MRN: [id number] ? ?CC:  ?Chief Complaint  ?Patient presents with  ? Follow-up  ? ? ?HPI ?Janice French states that she was seen in the emergency department yesterday.  Hospital course: ?Janice French is a 41 y.o. female. ?  ?Patient with history of hypertension presents today with complaints of chest pain.  She states that same began last Thursday and has been intermittent since then without discernible triggers.  She states that the pain is reproducible to palpation and pleuritic in nature and radiates 'all throughout my chest.' She states that she has had clear intermittent nipple discharge from her left breat since onset of symptoms.  She also states that since yesterday she has been having left knee and calf pain.  Additionally, patient states that she has been out of her blood pressure medication for the past year. Of note, patient is an everyday smoker, denies recreational drug use or IVDU. Denies history of blood clots or exogenous estrogen use. No recent travel or surgeries. ?  ?The history is provided by the patient. No language interpreter was used.  ? ?                       ?Medical Decision Making ?Amount and/or Complexity of Data Reviewed ?Labs: ordered. ?Radiology: ordered. ?  ?Risk ?OTC drugs. ?Prescription drug management. ?  ?  ?This patient presents to the ED for concern of chest pain, this involves an extensive number of treatment options, and is a complaint that carries with it a high risk of complications and morbidity. ?  ?  ?Co morbidities that complicate the patient evaluation ?  ?Hx uncontrolled htn ?  ?  ?Lab Tests: ?  ?I Ordered, and personally interpreted labs.  The pertinent results include: Troponin negative, no acute laboratory findings ?  ?  ?Imaging Studies ordered: ?  ?I ordered imaging studies including CXR, DVT  ?I independently visualized and interpreted imaging  which showed DVT negative, no acute cardiopulmonary disease ?I agree with the radiologist interpretation ?  ?  ?Cardiac Monitoring: / EKG: ?  ?The patient was maintained on a cardiac monitor.  I personally viewed and interpreted the cardiac monitored which showed an underlying rhythm of: sinus rhythm ?  ?Given the large differential diagnosis for Janice French, the decision making in this case is of high complexity. ?  ?After evaluating all of the data points in this case, the presentation of Janice French is NOT consistent with Acute Coronary Syndrome (ACS) and/or myocardial ischemia, pulmonary embolism, aortic dissection; Janice French, significant arrythmia, pneumothorax, cardiac tamponade, or other emergent cardiopulmonary condition. ?  ?Further, the presentation of Janice French is NOT consistent with pericarditis, myocarditis, cholecystitis, pancreatitis, mediastinitis, endocarditis, new valvular disease. ?  ?Additionally, the presentation of Janice Johnsonis NOT consistent with flail chest, cardiac contusion, ARDS, or significant intra-thoracic or intra-abdominal bleeding. ?  ?Moreover, this presentation is NOT consistent with pneumonia, sepsis, or pyelonephritis. ?  ?The patient has a   heart score of 2, low risk.  She is afebrile, nontoxic-appearing, and in no acute distress with reassuring vital signs.  She is PERC negative.  Patient needs a mammogram for evaluation of her breast symptoms, I have educated patient to same and given her a follow-up to the wellness center with the number to call to schedule an appointment.  I also refilled her blood pressure medication as  it is quite high today.  I have educated her on the importance of taking this medication as well as the importance of establishing care with a PCP.  Patient is understanding and amenable with plan, educated on red flag symptoms of prompt immediate return.  Discharged stable condition. ?  ? ? ?States today that she had previously been on  amlodipine and states that it was started during a previous visit to the emergency department.  States that she is going to get a blood pressure cuff today. ? ?States today that she has been having epigastric discomfort, does have acid reflux and heartburn on an almost daily basis.  Has not tried anything for relief. ? ?States that she is scheduled to have surgery on her right hand on 27 April due to trigger finger.  States that she has previously had carpal tunnel surgery on the same hand. ? ?Does endorse previous stent in left leg due to venous insufficiency.  States that she does continue to smoke smokes approximately 5 black and mild cigars a day.  States that she does want to stop smoking. ? 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  States that she is only sleeping approximately 5 hours a night. ? ? ? ? ? ?Past Medical History:  ?Diagnosis Date  ? Dysmenorrhea   ? Hypertension   ? ? ?Past Surgical History:  ?Procedure Laterality Date  ? ABDOMINAL AORTOGRAM W/LOWER EXTREMITY Bilateral 08/19/2020  ? Procedure: ABDOMINAL AORTOGRAM W/LOWER EXTREMITY;  Surgeon: Maeola Harman, MD;  Location: [facility] INVASIVE CV LAB;  Service: Cardiovascular;  Laterality: Bilateral;  ? CHOLECYSTECTOMY    ? PERIPHERAL VASCULAR INTERVENTION Left 08/19/2020  ? Procedure: PERIPHERAL VASCULAR INTERVENTION;  Surgeon: Maeola Harman, MD;  Location[facility] INVASIVE CV LAB;  Service: Cardiovascular;  Laterality: Left;  Common iliac  ? TUBAL LIGATION    ? ? ?Family History  ?Problem Relation Age of Onset  ? Hypertension Other   ? Cancer Other   ? ? ?Social History  ? ?Socioeconomic History  ? Marital status: Single  ?  Spouse name: Not on file  ? Number of children: Not on file  ? Years of education: Not on file  ? Highest education level: Not on file  ?Occupational History  ? Not on file   ?Tobacco Use  ? Smoking status: Every Day  ?  Types: Cigars  ? Smokeless tobacco: Never  ?Vaping Use  ? Vaping Use: Former  ? Substances: Nicotine  ?Substance and Sexual Activity  ? Alcohol use: No  ? Drug use: Not Currently  ? Sexual activity: Never  ?  Birth control/protection: Surgical  ?Other Topics Concern  ? Not on file  ?Social History Narrative  ? Not on file  ? ?Social Determinants of Health  ? ?Financial Resource Strain: Not on file  ?Food Insecurity: Not on file  ?Transportation Needs: Not on file  ?Physical Activity: Not on file  ?Stress: Not on file  ?Social Connections: Not on file  ?Intimate Partner Violence: Not on file  ? ? ?ROS ?Review of Systems  ?Constitutional: Negative.   ?HENT: Negative.    ?Eyes: Negative.   ?Respiratory:  Negative for shortness of breath.   ?Cardiovascular:  Negative for chest pain, palpitations and leg swelling.  ?Gastrointestinal:  Positive for abdominal pain. Negative for nausea and  vomiting.  ?Endocrine: Negative.   ?Genitourinary: Negative.   ?Musculoskeletal: Negative.   ?Skin: Negative.   ?Allergic/Immunologic: Negative.   ?Neurological:  Negative for dizziness, weakness and headaches.  ?Hematological: Negative.   ?Psychiatric/Behavioral:  Positive for sleep disturbance. Negative for dysphoric mood, self-injury and suicidal ideas. The patient is not nervous/anxious.   ? ?Objective:  ? ?Today's Vitals: BP (!) 188/102 (BP Location: Left Arm, Cuff Size: Large)   Pulse 92   Temp 98.8 ?F (37.1 ?C) (Oral)   Resp 18   Ht 5' (1.524 m)   Wt 169 lb (76.7 kg)   LMP 02/04/2022   SpO2 99%   BMI 33.01 kg/m?  ? ?Physical Exam ?Vitals and nursing note reviewed.  ?Constitutional:   ?   General: She is not in acute distress. ?   Appearance: Normal appearance. She is not ill-appearing.  ?HENT:  ?   Head: Normocephalic and atraumatic.  ?   Right Ear: External ear normal.  ?   Left Ear: External ear normal.  ?   Nose: Nose normal.  ?   Mouth/Throat:  ?   Mouth: Mucous membranes  are moist.  ?   Pharynx: Oropharynx is clear.  ?Eyes:  ?   Extraocular Movements: Extraocular movements intact.  ?   Conjunctiva/sclera: Conjunctivae normal.  ?   Pupils: Pupils are equal, round, and reactive to ligh

## 2022-03-05 ENCOUNTER — Telehealth: Payer: Self-pay | Admitting: Physician Assistant

## 2022-03-05 LAB — COMP. METABOLIC PANEL (12)
AST: 12 IU/L (ref 0–40)
Albumin/Globulin Ratio: 1.5 (ref 1.2–2.2)
Albumin: 4.3 g/dL (ref 3.8–4.8)
Alkaline Phosphatase: 112 IU/L (ref 44–121)
BUN/Creatinine Ratio: 8 — ABNORMAL LOW (ref 9–23)
BUN: 5 mg/dL — ABNORMAL LOW (ref 6–24)
Bilirubin Total: 0.2 mg/dL (ref 0.0–1.2)
Calcium: 9.5 mg/dL (ref 8.7–10.2)
Chloride: 103 mmol/L (ref 96–106)
Creatinine, Ser: 0.66 mg/dL (ref 0.57–1.00)
Globulin, Total: 2.9 g/dL (ref 1.5–4.5)
Glucose: 108 mg/dL — ABNORMAL HIGH (ref 70–99)
Potassium: 4.2 mmol/L (ref 3.5–5.2)
Sodium: 138 mmol/L (ref 134–144)
Total Protein: 7.2 g/dL (ref 6.0–8.5)
eGFR: 112 mL/min/{1.73_m2} (ref >59)

## 2022-03-05 LAB — LIPID PANEL
Chol/HDL Ratio: 4.1 ratio (ref 0.0–4.4)
Cholesterol, Total: 195 mg/dL (ref 100–199)
HDL: 48 mg/dL (ref >39)
LDL Chol Calc (NIH): 132 mg/dL — ABNORMAL HIGH (ref 0–99)
Triglycerides: 81 mg/dL (ref 0–149)
VLDL Cholesterol Cal: 15 mg/dL (ref 5–40)

## 2022-03-05 LAB — TSH: TSH: 2.04 u[IU]/mL (ref 0.450–4.500)

## 2022-03-05 LAB — HCV AB W REFLEX TO QUANT PCR: HCV Ab: NONREACTIVE

## 2022-03-05 LAB — HCV INTERPRETATION

## 2022-03-05 LAB — HIV ANTIBODY (ROUTINE TESTING W REFLEX): HIV Screen 4th Generation wRfx: NONREACTIVE

## 2022-03-05 NOTE — Telephone Encounter (Signed)
Please advise 

## 2022-03-05 NOTE — Telephone Encounter (Signed)
MA will reiterate and inform patient.

## 2022-03-05 NOTE — Telephone Encounter (Signed)
Copied from CRM [id number]. Topic: General - Other ?>> Mar 05, 2022 11:06 AM McGill, Darlina Rumpf wrote: ?Reason for CRM: Pt stated she has some FMLA papers that need to be filled out. Pt stated her employer told her they needed to be filled out by provider, who saw her yesterday, 03/04/2022 Mayers, Cari. Pt is asking if she can drop them off. ? ?Pt requesting a call back. ?

## 2022-03-06 ENCOUNTER — Telehealth: Payer: Self-pay

## 2022-03-06 NOTE — Telephone Encounter (Signed)
-----   Message from Roney Jaffe, PA-C sent at 03/05/2022 10:56 AM EDT ----- ?Please call patient and let her know that her thyroid function, kidney and liver function are within normal limits.  She does show signs of dehydration.  Her screening for hepatitis C and HIV were negative. ? ?Her cholesterol overall is within normal limits, however her LDL is elevated and her risk of a cardiovascular event in the next 10 years is almost 30%.  It is very important that she take the rosuvastatin on a daily basis.  She should have his cholesterol level rechecked in 3 to 6 months. ?The 10-year ASCVD risk score (Arnett DK, et al., 2019) is: 29.9% ?  Values used to calculate the score: ?    Age: 41 years ?    Sex: Female ?    Is Non-Hispanic African American: Yes ?    Diabetic: No ?    Tobacco smoker: Yes ?    Systolic Blood Pressure: 188 mmHg ?    Is BP treated: Yes ?    HDL Cholesterol: 48 mg/dL ?    Total Cholesterol: 195 mg/dL ? ?

## 2022-03-06 NOTE — Telephone Encounter (Signed)
Pt confirmed DOB and dropped off previously mentioned paperwork discussed - for Janice French to review on Monday 03/09/22 as previously noted and placed in provider bin. Thank you ?

## 2022-03-06 NOTE — Telephone Encounter (Signed)
Patient verified DOB ?Patient is aware of results and needing to increase water and take crestor daily due to risk being almost 30%. Patient will bring paperwork to the office to be reviewed on Monday by provider. ?

## 2022-03-10 ENCOUNTER — Telehealth: Payer: Self-pay | Admitting: *Deleted

## 2022-03-10 NOTE — Telephone Encounter (Signed)
MA UTR patient or LVM. Patient may pick up completed FMLA paperwork from front desk. ?

## 2022-04-24 ENCOUNTER — Ambulatory Visit: Payer: 59 | Admitting: Family Medicine

## 2022-05-19 DIAGNOSIS — Z9889 Other specified postprocedural states: Secondary | ICD-10-CM

## 2022-05-19 DIAGNOSIS — R0789 Other chest pain: Secondary | ICD-10-CM

## 2022-05-19 HISTORY — DX: Other chest pain: R07.89

## 2022-05-19 HISTORY — PX: CARDIAC CATHETERIZATION: SHX172

## 2022-05-19 HISTORY — DX: Other specified postprocedural states: Z98.890

## 2022-06-02 ENCOUNTER — Ambulatory Visit: Payer: 59 | Admitting: Family Medicine

## 2022-06-03 IMAGING — US US EXTREM LOW VENOUS*L*
1 series · 14 of 24 positions shown · non-contrast
Comparison: None

CLINICAL DATA: Sudden onset LEFT leg pain

EXAM:
LEFT LOWER EXTREMITY VENOUS DOPPLER ULTRASOUND
TECHNIQUE: Gray-scale sonography with compression, as well as color and duplex
ultrasound, were performed to evaluate the deep venous system(s)
from the level of the common femoral vein through the popliteal and
proximal calf veins.

[Series 1: us extrem low venous*left* · 14 of 45 slices shown]
[im 1/45]
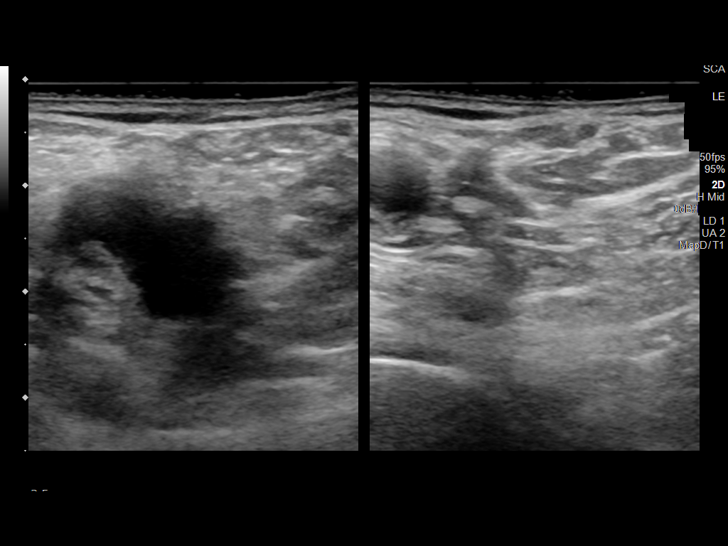
[im 4/45]
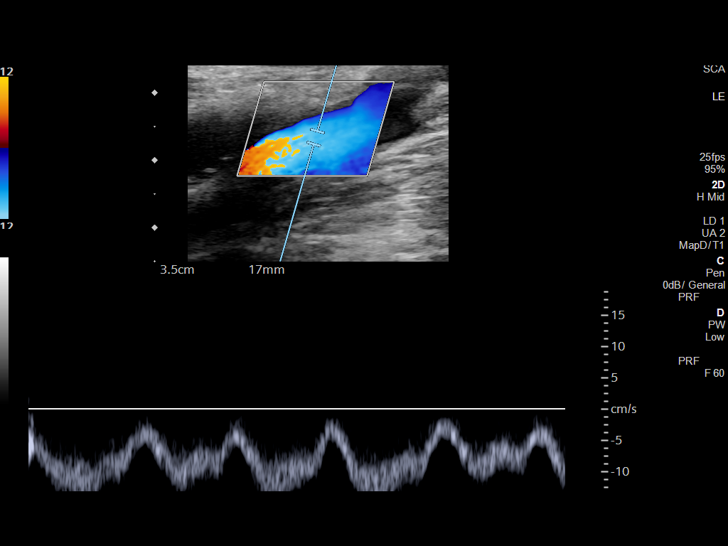
[im 8/45]
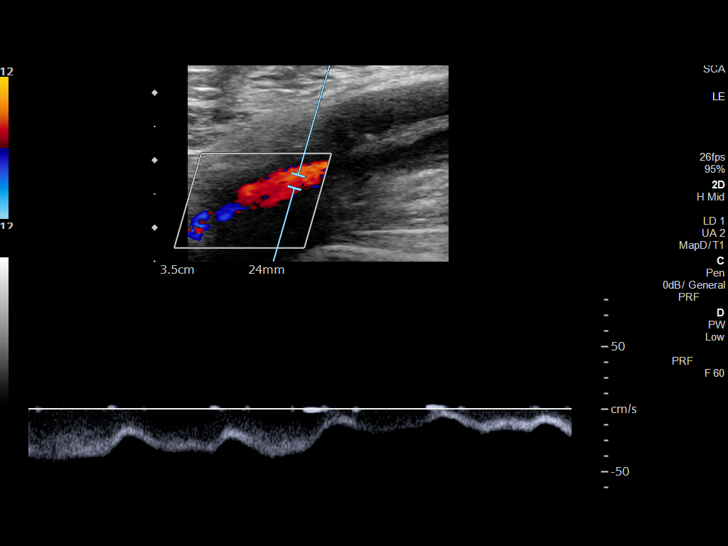
[im 12/45]
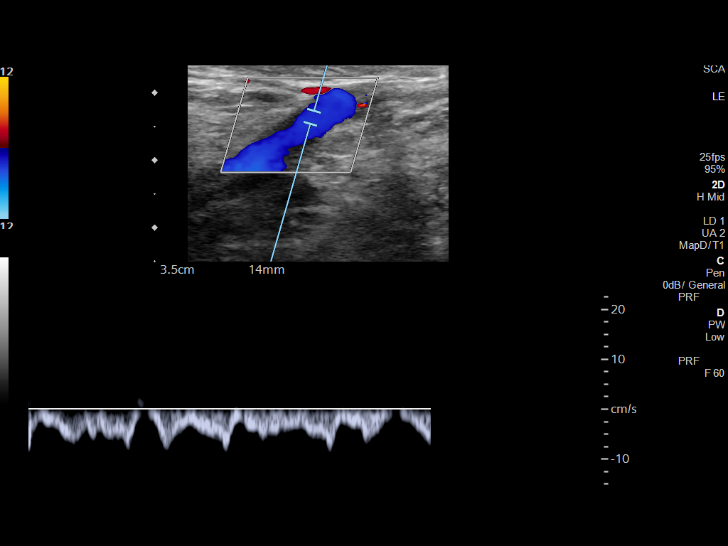
[im 14/45]
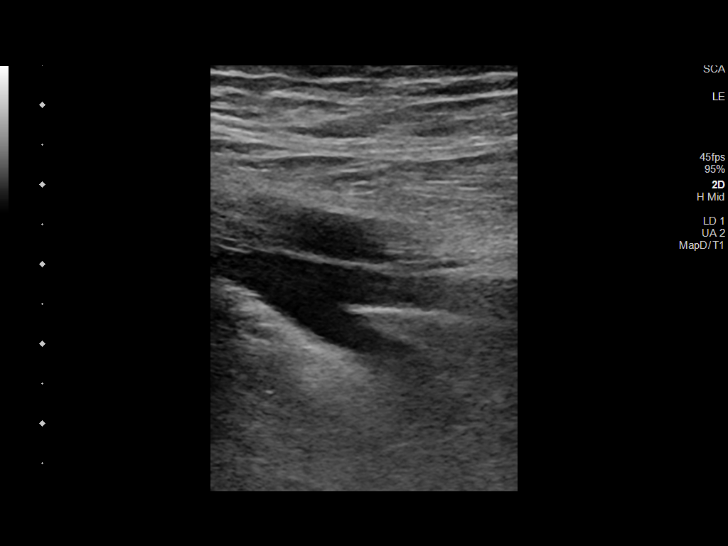
[im 18/45]
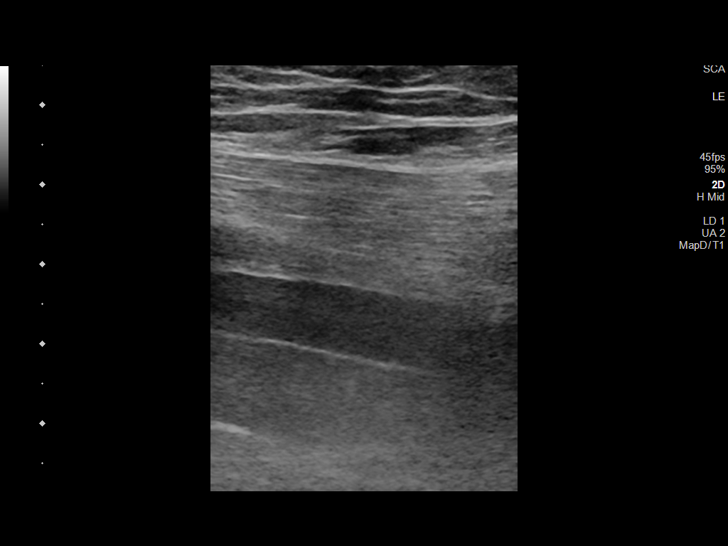
[im 22/45]
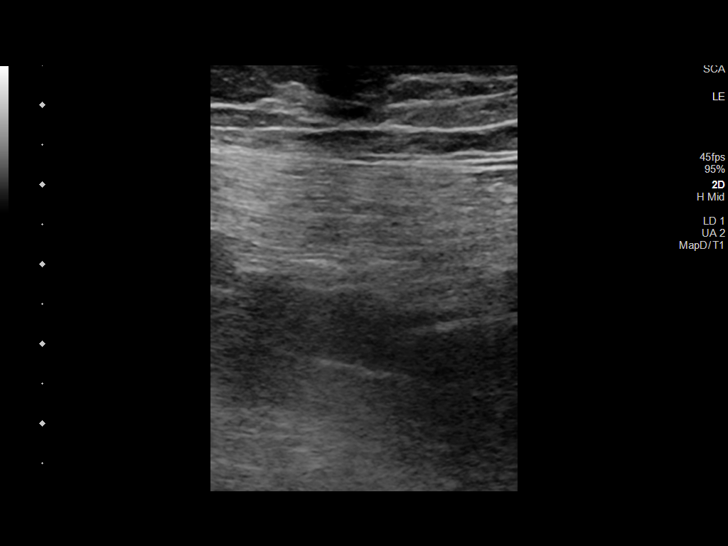
[im 23/45]
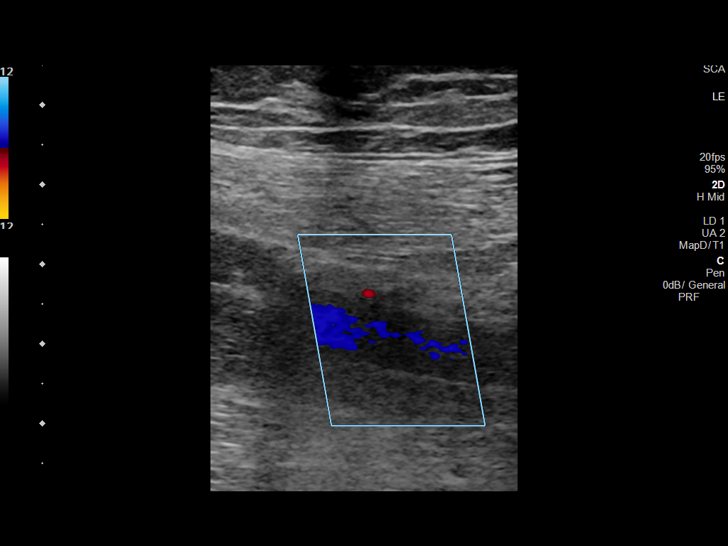
[im 27/45]
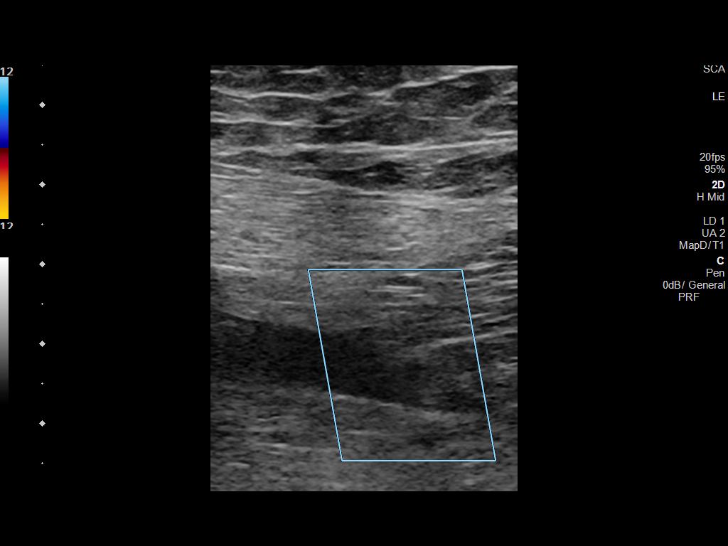
[im 31/45]
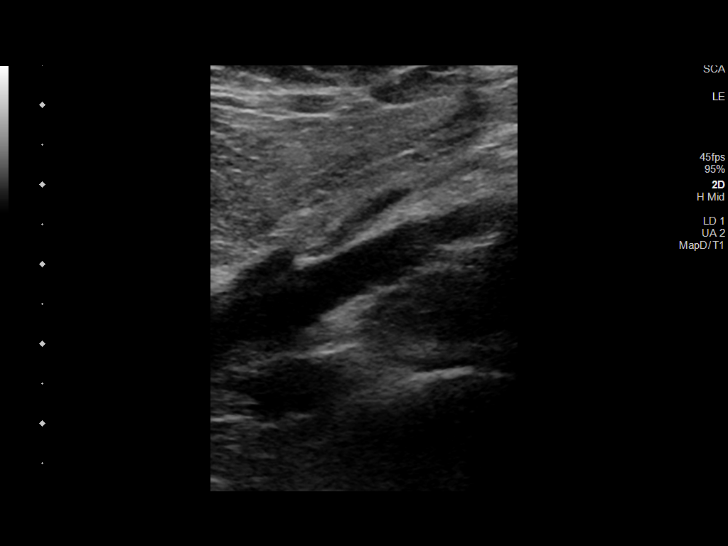
[im 35/45]
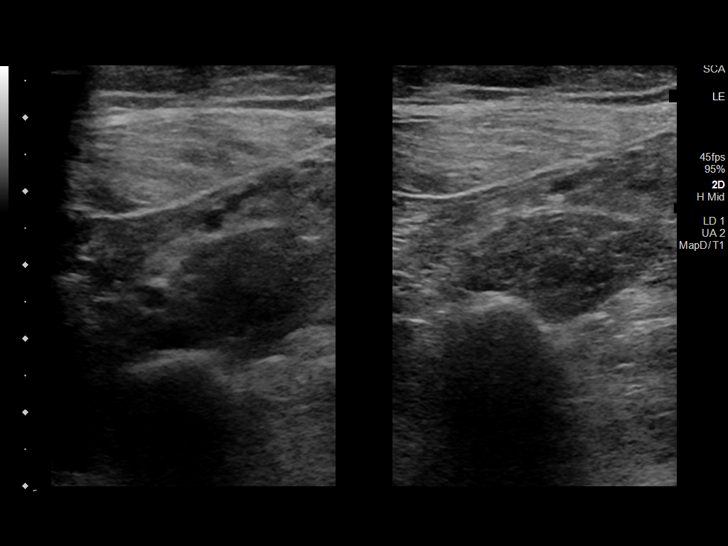
[im 37/45]
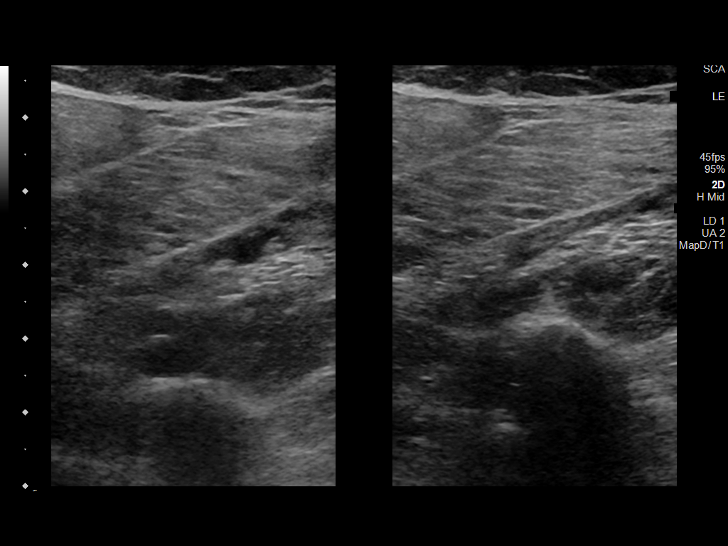
[im 41/45]
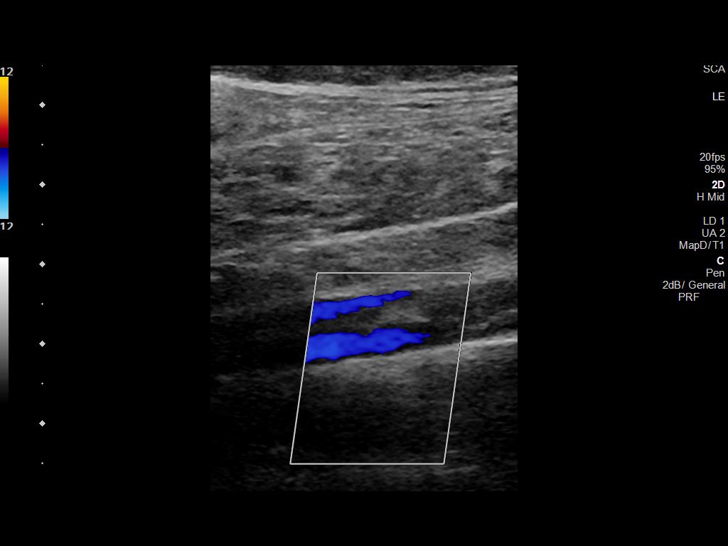
[im 45/45]
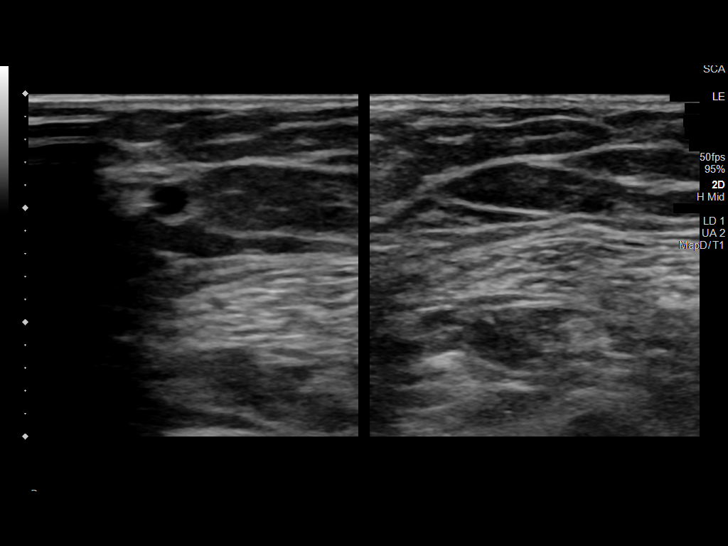

[14 of 24 positions shown; findings below may reference images not displayed]

FINDINGS: VENOUS

Normal compressibility of the common femoral, superficial femoral,
and popliteal veins, as well as the visualized calf veins.
Visualized portions of profunda femoral vein and great saphenous
vein unremarkable. No filling defects to suggest DVT on grayscale or
color Doppler imaging. Doppler waveforms show normal direction of
venous flow, normal respiratory plasticity and response to
augmentation.

Limited views of the contralateral common femoral vein are
unremarkable.

OTHER

None.

Limitations: none
IMPRESSION: No evidence of deep venous thrombosis in the LEFT lower extremity.

## 2022-06-11 ENCOUNTER — Ambulatory Visit: Payer: 59 | Admitting: Family Medicine

## 2022-06-18 ENCOUNTER — Emergency Department (HOSPITAL_BASED_OUTPATIENT_CLINIC_OR_DEPARTMENT_OTHER)
Admission: EM | Admit: 2022-06-18 | Discharge: 2022-06-18 | Disposition: A | Discharge status: Home / Self Care | Payer: BC Managed Care – PPO | Attending: Emergency Medicine | Admitting: Emergency Medicine

## 2022-06-18 ENCOUNTER — Emergency Department (HOSPITAL_BASED_OUTPATIENT_CLINIC_OR_DEPARTMENT_OTHER): Payer: BC Managed Care – PPO

## 2022-06-18 ENCOUNTER — Encounter (HOSPITAL_BASED_OUTPATIENT_CLINIC_OR_DEPARTMENT_OTHER): Payer: Self-pay | Admitting: Emergency Medicine

## 2022-06-18 DIAGNOSIS — G43909 Migraine, unspecified, not intractable, without status migrainosus: Secondary | ICD-10-CM | POA: Diagnosis not present

## 2022-06-18 DIAGNOSIS — R519 Headache, unspecified: Secondary | ICD-10-CM | POA: Diagnosis not present

## 2022-06-18 DIAGNOSIS — I10 Essential (primary) hypertension: Secondary | ICD-10-CM | POA: Insufficient documentation

## 2022-06-18 DIAGNOSIS — Z79899 Other long term (current) drug therapy: Secondary | ICD-10-CM | POA: Insufficient documentation

## 2022-06-18 LAB — CBC WITH DIFFERENTIAL/PLATELET
Abs Immature Granulocytes: 0.02 10*3/uL (ref 0.00–0.07)
Basophils Absolute: 0 10*3/uL (ref 0.0–0.1)
Basophils Relative: 0 %
Eosinophils Absolute: 0.1 10*3/uL (ref 0.0–0.5)
Eosinophils Relative: 2 %
HCT: 39.1 % (ref 36.0–46.0)
Hemoglobin: 13.5 g/dL (ref 12.0–15.0)
Immature Granulocytes: 0 %
Lymphocytes Relative: 43 %
Lymphs Abs: 3 10*3/uL (ref 0.7–4.0)
MCH: 32.4 pg (ref 26.0–34.0)
MCHC: 34.5 g/dL (ref 30.0–36.0)
MCV: 93.8 fL (ref 80.0–100.0)
Monocytes Absolute: 0.5 10*3/uL (ref 0.1–1.0)
Monocytes Relative: 6 %
Neutro Abs: 3.4 10*3/uL (ref 1.7–7.7)
Neutrophils Relative %: 49 %
Platelets: 345 10*3/uL (ref 150–400)
RBC: 4.17 MIL/uL (ref 3.87–5.11)
RDW: 13.4 % (ref 11.5–15.5)
WBC: 7.1 10*3/uL (ref 4.0–10.5)
nRBC: 0 % (ref 0.0–0.2)

## 2022-06-18 LAB — COMPREHENSIVE METABOLIC PANEL
ALT: 9 U/L (ref 0–44)
AST: 14 U/L — ABNORMAL LOW (ref 15–41)
Albumin: 3.5 g/dL (ref 3.5–5.0)
Alkaline Phosphatase: 92 U/L (ref 38–126)
Anion gap: 7 (ref 5–15)
BUN: 7 mg/dL (ref 6–20)
CO2: 25 mmol/L (ref 22–32)
Calcium: 8.8 mg/dL — ABNORMAL LOW (ref 8.9–10.3)
Chloride: 105 mmol/L (ref 98–111)
Creatinine, Ser: 0.64 mg/dL (ref 0.44–1.00)
GFR, Estimated: >60 mL/min (ref >60)
Glucose, Bld: 102 mg/dL — ABNORMAL HIGH (ref 70–99)
Potassium: 3.7 mmol/L (ref 3.5–5.1)
Sodium: 137 mmol/L (ref 135–145)
Total Bilirubin: 0.5 mg/dL (ref 0.3–1.2)
Total Protein: 7.5 g/dL (ref 6.5–8.1)

## 2022-06-18 LAB — URINALYSIS, ROUTINE W REFLEX MICROSCOPIC
Bilirubin Urine: NEGATIVE
Glucose, UA: NEGATIVE mg/dL
Hgb urine dipstick: NEGATIVE
Ketones, ur: NEGATIVE mg/dL
Leukocytes,Ua: NEGATIVE
Nitrite: NEGATIVE
Protein, ur: NEGATIVE mg/dL
Specific Gravity, Urine: 1.02 (ref 1.005–1.030)
pH: 7 (ref 5.0–8.0)

## 2022-06-18 LAB — LIPASE, BLOOD: Lipase: 33 U/L (ref 11–51)

## 2022-06-18 LAB — PREGNANCY, URINE: Preg Test, Ur: NEGATIVE

## 2022-06-18 MED ORDER — KETOROLAC TROMETHAMINE 30 MG/ML IJ SOLN
30.0000 mg | Freq: Once | INTRAMUSCULAR | Status: AC
  Administered 2022-06-18: 30 mg via INTRAMUSCULAR
  Filled 2022-06-18: qty 1

## 2022-06-18 MED ORDER — ONDANSETRON 4 MG PO TBDP: 8.0000 mg | ORAL_TABLET | Freq: Once | ORAL | Status: DC

## 2022-06-18 MED ORDER — IBUPROFEN 600 MG PO TABS: 600.0000 mg | ORAL_TABLET | Freq: Four times a day (QID) | ORAL | 0 refills | Status: DC | PRN

## 2022-06-18 MED ORDER — AMLODIPINE BESYLATE 5 MG PO TABS
10.0000 mg | ORAL_TABLET | Freq: Once | ORAL | Status: AC
  Administered 2022-06-18: 10 mg via ORAL
  Filled 2022-06-18: qty 2

## 2022-06-18 MED ORDER — ONDANSETRON HCL 4 MG/2ML IJ SOLN: 4.0000 mg | Freq: Once | INTRAMUSCULAR | Status: DC

## 2022-06-18 MED ORDER — PROCHLORPERAZINE EDISYLATE 10 MG/2ML IJ SOLN
10.0000 mg | Freq: Once | INTRAMUSCULAR | Status: AC
  Administered 2022-06-18: 10 mg via INTRAVENOUS
  Filled 2022-06-18: qty 2

## 2022-06-18 MED ORDER — HYDROCHLOROTHIAZIDE 25 MG PO TABS
25.0000 mg | ORAL_TABLET | Freq: Once | ORAL | Status: AC
  Administered 2022-06-18: 25 mg via ORAL
  Filled 2022-06-18: qty 1

## 2022-06-18 MED ORDER — DEXAMETHASONE SODIUM PHOSPHATE 10 MG/ML IJ SOLN
10.0000 mg | Freq: Once | INTRAMUSCULAR | Status: AC
  Administered 2022-06-18: 10 mg via INTRAVENOUS
  Filled 2022-06-18: qty 1

## 2022-06-18 MED ORDER — SODIUM CHLORIDE 0.9 % IV BOLUS
500.0000 mL | Freq: Once | INTRAVENOUS | Status: AC
  Administered 2022-06-18: 500 mL via INTRAVENOUS

## 2022-06-18 NOTE — Discharge Instructions (Addendum)
Note the work-up today was overall negative for any intracranial bleeding or abnormality.  Your symptoms most likely secondary to migraine type headache.  We treated this in the emergency department with a migraine cocktail.  I prescribed ibuprofen to take as needed for migraine type symptoms in the future.  Recommend close follow-up with PCP regarding your visit to the emergency department today as they can prescribe medicines to help with migraine treatment as well as to stay on top of migraines.  Please do not hesitate to return to emergency department if the worrisome signs and symptoms we discussed become apparent.

## 2022-06-18 NOTE — ED Provider Notes (Signed)
[facility] EM[facility]GENCY DEPARTMENT Provider Note   CSN: [id number] Arrival date & time: 06/18/22  2044     History  Chief Complaint  Patient presents with   Migraine    Janice French is a 41 y.o. female.   Migraine  41 year old female presents emergency department with complaints of headache.  Patient states that headache began approximately 1 week ago has been intermittent since its onset.  She denies history of migraine type headaches.  She describes the headache has involving the "top, back, ears, neck."  It is described as sharp and pressure.  She describes the one today as being the worst headache ever.  She states that it was quick onset and has not relieved since it started.  She notes sensitivity to light and sound.  She also notes some blurry vision.  She notes feeling nauseated and vomiting once today.  She denies gait disturbance, weakness/sensory deficits, fever, altered mentation, facial droop, difficulty speaking, chest pain, shortness of breath, abdominal pain, nausea/vomiting/diarrhea, urinary/vaginal symptoms, change in bowel habits.  Past medical history significant for dysmenorrhea, hypertension  Home Medications Prior to Admission medications   Medication Sig Start Date End Date Taking? Authorizing Provider  ibuprofen (ADVIL) 600 MG tablet Take 1 tablet (600 mg total) by mouth every 6 (six) hours as needed. 06/18/22  Yes Dion Saucier A, PA  amLODipine (NORVASC) 10 MG tablet Take 1 tablet (10 mg total) by mouth daily. 03/03/22   Smoot, Leary Roca, PA-C  Capsaicin-Menthol-Methyl Sal (CAPSAICIN-METHYL SAL-MENTHOL) 0.025-1-12 % CREA Apply 1 application topically every 6 (six) hours as needed. Rubbed onto your abdomen every 6 hours if needed for pain and nausea 06/23/19   Charlesetta Shanks, MD  hydrochlorothiazide (HYDRODIURIL) 25 MG tablet Take 1 tablet (25 mg total) by mouth daily. 03/04/22   Mayers, Cari S, PA-C  HYDROcodone-acetaminophen (NORCO/VICODIN) 5-325 MG  tablet Take 1 tablet by mouth every 6 (six) hours. 02/24/21   [provider]  omeprazole (PRILOSEC) 20 MG capsule Take 1 capsule (20 mg total) by mouth daily. 03/04/22   Mayers, Cari S, PA-C  oxyCODONE-acetaminophen (PERCOCET/ROXICET) 5-325 MG tablet Take 1 tablet by mouth every 6 (six) hours as needed for severe pain. 02/18/22   Drenda Freeze, MD  predniSONE (DELTASONE) 20 MG tablet Take 60 mg daily x 2 days then 40 mg daily x 2 days then 20 mg daily x 2 days 02/18/22   Drenda Freeze, MD  rosuvastatin (CRESTOR) 10 MG tablet Take 1 tablet (10 mg total) by mouth daily. 02/04/21 02/04/22  Horton, Barbette Hair, MD  promethazine (PHENERGAN) 25 MG tablet Take 1 tablet (25 mg total) by mouth every 6 (six) hours as needed for nausea or vomiting. 06/23/19 08/06/20  Charlesetta Shanks, MD      Allergies    Patient has no known allergies.    Review of Systems   Review of Systems  All other systems reviewed and are negative.   Physical Exam Updated Vital Signs BP (!) 176/91   Pulse 70   Temp 98.4 F (36.9 C) (Oral)   Resp 16   Ht 5' (1.524 m)   Wt 80.3 kg   LMP 06/04/2022 (Exact Date)   SpO2 100%   BMI 34.57 kg/m  Physical Exam Vitals and nursing note reviewed.  Constitutional:      General: She is not in acute distress.    Appearance: She is well-developed. She is obese. She is not ill-appearing.  HENT:     Head: Normocephalic and  atraumatic.     Right Ear: Tympanic membrane normal.     Left Ear: Tympanic membrane normal.     Nose: Nose normal.     Mouth/Throat:     Mouth: Mucous membranes are moist.     Pharynx: Oropharynx is clear.  Eyes:     General: No visual field deficit.    Extraocular Movements: Extraocular movements intact.     Conjunctiva/sclera: Conjunctivae normal.     Pupils: Pupils are equal, round, and reactive to light.  Cardiovascular:     Rate and Rhythm: Normal rate and regular rhythm.     Heart sounds: No murmur heard. Pulmonary:     Effort:  Pulmonary effort is normal. No respiratory distress.     Breath sounds: Normal breath sounds.  Abdominal:     Palpations: Abdomen is soft.     Tenderness: There is no abdominal tenderness. There is no guarding.  Musculoskeletal:        General: No swelling. Normal range of motion.     Cervical back: Normal range of motion and neck supple. No rigidity or tenderness.     Right lower leg: No edema.  Skin:    General: Skin is warm and dry.     Capillary Refill: Capillary refill takes less than 2 seconds.  Neurological:     General: No focal deficit present.     Mental Status: She is alert and oriented to person, place, and time.     Cranial Nerves: No dysarthria or facial asymmetry.     Sensory: Sensation is intact.     Motor: No pronator drift.     Coordination: Finger-Nose-Finger Test and Heel to Country Walk Test normal.     Gait: Gait is intact.     Comments: Cranial 3 through 12 grossly intact.  PERRLA bilaterally.  EOMs full and intact bilaterally.  Muscle strength 5 out of 5 for upper and lower extremities.  No sensory deficits along major nerve distributions of upper extremities.  DTRs normal and symmetric bilaterally.  Psychiatric:        Mood and Affect: Mood normal.     ED Results / Procedures / Treatments   Labs (all labs ordered are listed, but only abnormal results are displayed) Labs Reviewed  COMPREHENSIVE METABOLIC PANEL - Abnormal; Notable for the following components:      Result Value   Glucose, Bld 102 (*)    Calcium 8.8 (*)    AST 14 (*)    All other components within normal limits  LIPASE, BLOOD  CBC WITH DIFFERENTIAL/PLATELET  PREGNANCY, URINE  URINALYSIS, ROUTINE W REFLEX MICROSCOPIC    EKG None  Radiology CT Head Wo Contrast  Result Date: 06/18/2022 CLINICAL DATA:  Sudden severe headache. EXAM: CT HEAD WITHOUT CONTRAST TECHNIQUE: Contiguous axial images were obtained from the base of the skull through the vertex without intravenous contrast. RADIATION  DOSE REDUCTION: This exam was performed according to the departmental dose-optimization program which includes automated exposure control, adjustment of the mA and/or kV according to patient size and/or use of iterative reconstruction technique. COMPARISON:  11/20/2020 FINDINGS: Brain: No evidence of acute infarction, hemorrhage, hydrocephalus, extra-axial collection or mass lesion/mass effect. Vascular: No hyperdense vessel or unexpected calcification. Skull: Normal. Negative for fracture or focal lesion. Sinuses/Orbits: No acute finding. Other: None. IMPRESSION: No acute intracranial abnormalities. Electronically Signed   By: Burman Nieves M.D.   On: 06/18/2022 22:15    Procedures Procedures    Medications Ordered in ED Medications  sodium  chloride 0.9 % bolus 500 mL (500 mLs Intravenous New Bag/Given 06/18/22 2138)  ketorolac (TORADOL) 30 MG/ML injection 30 mg (30 mg Intramuscular Given 06/18/22 2133)  dexamethasone (DECADRON) injection 10 mg (10 mg Intravenous Given 06/18/22 2134)  prochlorperazine (COMPAZINE) injection 10 mg (10 mg Intravenous Given 06/18/22 2133)  amLODipine (NORVASC) tablet 10 mg (10 mg Oral Given 06/18/22 2139)  hydrochlorothiazide (HYDRODIURIL) tablet 25 mg (25 mg Oral Given 06/18/22 2139)    ED Course/ Medical Decision Making/ A&P Clinical Course as of 06/18/22 2257  Thu Jun 18, 2022  2226 CT Head Wo Contrast [CR]    Clinical Course User Index [CR] Wilnette Kales, PA                           Medical Decision Making Amount and/or Complexity of Data Reviewed Labs: ordered.  Risk Prescription drug management.   This patient presents to the ED for concern of headache, this involves an extensive number of treatment options, and is a complaint that carries with it a high risk of complications and morbidity.  The differential diagnosis includes Emergent considerations for headache include subarachnoid hemorrhage, meningitis, temporal arteritis, glaucoma, cerebral  ischemia, carotid/vertebral dissection, intracranial tumor, Venous sinus thrombosis, carbon monoxide poisoning, acute or chronic subdural hemorrhage.  Other considerations include: Migraine, Cluster headache, Hypertension, Caffeine, alcohol, or drug withdrawal, Pseudotumor cerebri, Arteriovenous malformation, Head injury, Neurocysticercosis, Post-lumbar puncture, Preeclampsia, Tension headache, Sinusitis, Cervical arthritis, Refractive error causing strain, Dental abscess, Otitis media, Temporomandibular joint syndrome, Depression, Somatoform disorder (eg, somatization) Trigeminal neuralgia, Glossopharyngeal neuralgia.   Co morbidities that complicate the patient evaluation  See HPI   Additional history obtained:  Additional history obtained from EMR External records from outside source obtained and reviewed including GFR of greater than 60 from 03/03/2022   Lab Tests:  I Ordered, and personally interpreted labs.  The pertinent results include: No acute abnormalities   Imaging Studies ordered:  I ordered imaging studies including CT head without contrast I independently visualized and interpreted imaging which showed no acute abnormalities I agree with the radiologist interpretation   Cardiac Monitoring: / EKG:  The patient was maintained on a cardiac monitor.  I personally viewed and interpreted the cardiac monitored which showed an underlying rhythm of: Sinus rhythm   Consultations Obtained:  N/a   Problem List / ED Course / Critical interventions / Medication management  Headache I ordered medication including Toradol, Decadron, Compazine, 500 mL normal saline for migraine cocktail.  Hydrochlorothiazide as well as amlodipine for continuation of at home blood pressure medication   Reevaluation of the patient after these medicines showed that the patient improved I have reviewed the patients home medicines and have made adjustments as needed   Social Determinants of  Health:  Smoke cigars every day.  Denies alcohol or illicit drug use   Test / Admission - Considered:  Headache Vitals signs significant for hypertension with initial blood pressure 176/91.  Blood pressure improved with administration of home blood pressure medication. Otherwise within normal range and stable throughout visit. Laboratory/imaging studies significant for: No acute abnormalities Patient's symptoms most likely secondary to migraine type headache given lack of findings on CT imaging as well as significant improvement with migraine cocktail.  Recommend at home therapy with ibuprofen as needed for abortive therapy.  Recommend close follow-up with PCP regarding current emergency department visit as well as future recurrences of migraine type headaches.  Doubt cerebral ischemia, doubt meningitis, doubt temporal arteritis,  doubt tumor/malignancy, doubt venous sinus thrombosis. Worrisome signs and symptoms were discussed with the patient, and the patient acknowledged understanding to return to the ED if noticed. Patient was stable upon discharge.         Final Clinical Impression(s) / ED Diagnoses Final diagnoses:  Migraine without status migrainosus, not intractable, unspecified migraine type    Rx / DC Orders ED Discharge Orders          Ordered    ibuprofen (ADVIL) 600 MG tablet  Every 6 hours PRN        06/18/22 2255              Peter Garter, PA 06/18/22 2257    Melene Plan, DO 06/18/22 2312

## 2022-06-18 NOTE — ED Triage Notes (Signed)
Pt brought in by EMS from home with c/o migraine  Pt states her migraine started last week but went away and came back yesterday  Pt states she has had nausea, vomiting, and sensitivity to light  Pt states she has been unable to hold down her blood pressure meds due to the vomiting

## 2022-06-24 ENCOUNTER — Other Ambulatory Visit: Payer: Self-pay | Admitting: Physician Assistant

## 2022-06-24 DIAGNOSIS — R1013 Epigastric pain: Secondary | ICD-10-CM

## 2022-06-24 NOTE — Telephone Encounter (Signed)
No encounters with Amelia Wilson, MD. Schedule appointment.  

## 2022-07-09 ENCOUNTER — Encounter: Payer: Self-pay | Admitting: Family Medicine

## 2022-07-09 ENCOUNTER — Ambulatory Visit: Payer: BC Managed Care – PPO | Admitting: Family Medicine

## 2022-07-09 VITALS — Temp 98.3°F | Resp 16 | Ht 60.0 in | Wt 175.0 lb

## 2022-07-09 DIAGNOSIS — I10 Essential (primary) hypertension: Secondary | ICD-10-CM

## 2022-07-09 DIAGNOSIS — R7303 Prediabetes: Secondary | ICD-10-CM

## 2022-07-09 LAB — POCT GLYCOSYLATED HEMOGLOBIN (HGB A1C): Hemoglobin A1C: 5.9 % — AB (ref 4.0–5.6)

## 2022-07-09 NOTE — Progress Notes (Signed)
Pt presents for abdominal pain -states that feels like she has hernia  -.A1c 6.4% 03/04/2022 down to 5.9%

## 2022-07-13 ENCOUNTER — Encounter: Payer: Self-pay | Admitting: Family Medicine

## 2022-07-13 NOTE — Progress Notes (Signed)
Established Patient Office Visit  Subjective    Patient ID: Janice French, female    DOB: July 12, 1981  Age: 41 y.o. MRN: [id number]  CC:  Chief Complaint  Patient presents with   Follow-up    HPI Janice French presents for routine follow up of chronic med issues. Patient denies acute complaints or concerns.    Outpatient Encounter Medications as of 07/09/2022  Medication Sig   amLODipine (NORVASC) 10 MG tablet Take 1 tablet (10 mg total) by mouth daily.   ASPIRIN LOW DOSE 81 MG tablet Take 81 mg by mouth daily.   Capsaicin-Menthol-Methyl Sal (CAPSAICIN-METHYL SAL-MENTHOL) 0.025-1-12 % CREA Apply 1 application topically every 6 (six) hours as needed. Rubbed onto your abdomen every 6 hours if needed for pain and nausea   hydrochlorothiazide (HYDRODIURIL) 25 MG tablet Take 1 tablet (25 mg total) by mouth daily.   HYDROcodone-acetaminophen (NORCO) 7.5-325 MG tablet Take 1 tablet by mouth every 6 (six) hours as needed.   ibuprofen (ADVIL) 600 MG tablet Take 1 tablet (600 mg total) by mouth every 6 (six) hours as needed.   metoprolol succinate (TOPROL-XL) 25 MG 24 hr tablet Take 25 mg by mouth daily.   omeprazole (PRILOSEC) 20 MG capsule Take 1 capsule (20 mg total) by mouth daily.   predniSONE (DELTASONE) 20 MG tablet Take 60 mg daily x 2 days then 40 mg daily x 2 days then 20 mg daily x 2 days   rosuvastatin (CRESTOR) 10 MG tablet Take 1 tablet (10 mg total) by mouth daily.   [DISCONTINUED] HYDROcodone-acetaminophen (NORCO/VICODIN) 5-325 MG tablet Take 1 tablet by mouth every 6 (six) hours.   [DISCONTINUED] oxyCODONE-acetaminophen (PERCOCET/ROXICET) 5-325 MG tablet Take 1 tablet by mouth every 6 (six) hours as needed for severe pain.   [DISCONTINUED] promethazine (PHENERGAN) 25 MG tablet Take 1 tablet (25 mg total) by mouth every 6 (six) hours as needed for nausea or vomiting.   No facility-administered encounter medications on file as of 07/09/2022.    Past Medical History:   Diagnosis Date   Dysmenorrhea    Hypertension     Past Surgical History:  Procedure Laterality Date   ABDOMINAL AORTOGRAM W/LOWER EXTREMITY Bilateral 08/19/2020   Procedure: ABDOMINAL AORTOGRAM W/LOWER EXTREMITY;  Surgeon: Maeola Harman, MD;  Location: [facility] INVASIVE CV LAB;  Service: Cardiovascular;  Laterality: Bilateral;   CHOLECYSTECTOMY     PERIPHERAL VASCULAR INTERVENTION Left 08/19/2020   Procedure: PERIPHERAL VASCULAR INTERVENTION;  Surgeon: Maeola Harman, MD;  Location: [facility] INVASIVE CV LAB;  Service: Cardiovascular;  Laterality: Left;  Common iliac   TUBAL LIGATION      Family History  Problem Relation Age of Onset   Hypertension Other    Cancer Other     Social History   Socioeconomic History   Marital status: Single    Spouse name: Not on file   Number of children: Not on file   Years of education: Not on file   Highest education level: Not on file  Occupational History   Not on file  Tobacco Use   Smoking status: Every Day    Types: Cigars   Smokeless tobacco: Never  Vaping Use   Vaping Use: Former   Substances: Nicotine  Substance and Sexual Activity   Alcohol use: No   Drug use: Not Currently   Sexual activity: Never    Birth control/protection: Surgical  Other Topics Concern   Not on file  Social History Narrative   Not on file   Social  Determinants of Health   Financial Resource Strain: Not on file  Food Insecurity: Not on file  Transportation Needs: Not on file  Physical Activity: Not on file  Stress: Not on file  Social Connections: Not on file  Intimate Partner Violence: Not on file    Review of Systems  All other systems reviewed and are negative.       Objective    Temp 98.3 F (36.8 C)   Resp 16   Ht 5' (1.524 m)   Wt 175 lb (79.4 kg)   LMP 06/04/2022 (Exact Date)   BMI 34.18 kg/m   Physical Exam Vitals and nursing note reviewed.  Constitutional:      General: She is not in acute  distress. Cardiovascular:     Rate and Rhythm: Normal rate and regular rhythm.  Pulmonary:     Effort: Pulmonary effort is normal.     Breath sounds: Normal breath sounds.  Abdominal:     Palpations: Abdomen is soft.     Tenderness: There is no abdominal tenderness.  Musculoskeletal:     Right lower leg: No edema.     Left lower leg: No edema.  Neurological:     General: No focal deficit present.     Mental Status: She is alert and oriented to person, place, and time.         Assessment & Plan:   1. Essential hypertension Appears stable. continue  2. Prediabetes Improved A1c and at goal. Continue present management. - POCT glycosylated hemoglobin (Hb A1C)    No follow-ups on file.   Tommie Raymond, MD

## 2022-08-11 DIAGNOSIS — M65331 Trigger finger, right middle finger: Secondary | ICD-10-CM | POA: Diagnosis not present

## 2022-08-28 ENCOUNTER — Telehealth: Payer: Self-pay | Admitting: Family Medicine

## 2022-08-28 NOTE — Telephone Encounter (Signed)
Pt returning all, Informed pt of message below  Pt states she will pickup forms

## 2022-08-28 NOTE — Telephone Encounter (Signed)
Attempted to reach pt to inquire about picking up FMLA documentation from April 2023. Office closed for Lunch from 12-1 and 5pm (Open 8 on Monday). Please advise and thank you.

## 2022-09-08 DIAGNOSIS — M65331 Trigger finger, right middle finger: Secondary | ICD-10-CM | POA: Diagnosis not present

## 2022-09-30 ENCOUNTER — Other Ambulatory Visit: Payer: Self-pay

## 2022-09-30 ENCOUNTER — Encounter (HOSPITAL_BASED_OUTPATIENT_CLINIC_OR_DEPARTMENT_OTHER): Payer: Self-pay | Admitting: Emergency Medicine

## 2022-09-30 ENCOUNTER — Emergency Department (HOSPITAL_BASED_OUTPATIENT_CLINIC_OR_DEPARTMENT_OTHER): Payer: BC Managed Care – PPO

## 2022-09-30 DIAGNOSIS — F17200 Nicotine dependence, unspecified, uncomplicated: Secondary | ICD-10-CM | POA: Insufficient documentation

## 2022-09-30 DIAGNOSIS — R0789 Other chest pain: Secondary | ICD-10-CM | POA: Diagnosis not present

## 2022-09-30 DIAGNOSIS — R079 Chest pain, unspecified: Secondary | ICD-10-CM | POA: Diagnosis not present

## 2022-09-30 DIAGNOSIS — J9801 Acute bronchospasm: Secondary | ICD-10-CM | POA: Diagnosis not present

## 2022-09-30 DIAGNOSIS — R0602 Shortness of breath: Secondary | ICD-10-CM | POA: Diagnosis not present

## 2022-09-30 DIAGNOSIS — F17290 Nicotine dependence, other tobacco product, uncomplicated: Secondary | ICD-10-CM | POA: Diagnosis not present

## 2022-09-30 DIAGNOSIS — I10 Essential (primary) hypertension: Secondary | ICD-10-CM | POA: Insufficient documentation

## 2022-09-30 DIAGNOSIS — Z79899 Other long term (current) drug therapy: Secondary | ICD-10-CM | POA: Insufficient documentation

## 2022-09-30 LAB — COMPREHENSIVE METABOLIC PANEL
ALT: 11 U/L (ref 0–44)
AST: 18 U/L (ref 15–41)
Albumin: 3.6 g/dL (ref 3.5–5.0)
Alkaline Phosphatase: 92 U/L (ref 38–126)
Anion gap: 7 (ref 5–15)
BUN: 6 mg/dL (ref 6–20)
CO2: 25 mmol/L (ref 22–32)
Calcium: 8.6 mg/dL — ABNORMAL LOW (ref 8.9–10.3)
Chloride: 106 mmol/L (ref 98–111)
Creatinine, Ser: 0.59 mg/dL (ref 0.44–1.00)
GFR, Estimated: >60 mL/min (ref >60)
Glucose, Bld: 70 mg/dL (ref 70–99)
Potassium: 3.3 mmol/L — ABNORMAL LOW (ref 3.5–5.1)
Sodium: 138 mmol/L (ref 135–145)
Total Bilirubin: 0.4 mg/dL (ref 0.3–1.2)
Total Protein: 7.7 g/dL (ref 6.5–8.1)

## 2022-09-30 LAB — CBC WITH DIFFERENTIAL/PLATELET
Abs Immature Granulocytes: 0.01 10*3/uL (ref 0.00–0.07)
Basophils Absolute: 0 10*3/uL (ref 0.0–0.1)
Basophils Relative: 1 %
Eosinophils Absolute: 0.2 10*3/uL (ref 0.0–0.5)
Eosinophils Relative: 3 %
HCT: 39.8 % (ref 36.0–46.0)
Hemoglobin: 13.6 g/dL (ref 12.0–15.0)
Immature Granulocytes: 0 %
Lymphocytes Relative: 52 %
Lymphs Abs: 4 10*3/uL (ref 0.7–4.0)
MCH: 31.7 pg (ref 26.0–34.0)
MCHC: 34.2 g/dL (ref 30.0–36.0)
MCV: 92.8 fL (ref 80.0–100.0)
Monocytes Absolute: 0.6 10*3/uL (ref 0.1–1.0)
Monocytes Relative: 7 %
Neutro Abs: 2.9 10*3/uL (ref 1.7–7.7)
Neutrophils Relative %: 37 %
Platelets: 325 10*3/uL (ref 150–400)
RBC: 4.29 MIL/uL (ref 3.87–5.11)
RDW: 14.7 % (ref 11.5–15.5)
WBC: 7.7 10*3/uL (ref 4.0–10.5)
nRBC: 0 % (ref 0.0–0.2)

## 2022-09-30 LAB — TROPONIN I (HIGH SENSITIVITY): Troponin I (High Sensitivity): 17 ng/L (ref <18)

## 2022-09-30 NOTE — ED Triage Notes (Signed)
Patient c/o chest pain x 2 weeks with shob/lightheadedness.  Patient has a "hernia in the middle of her stomach."

## 2022-10-01 ENCOUNTER — Emergency Department (HOSPITAL_BASED_OUTPATIENT_CLINIC_OR_DEPARTMENT_OTHER)
Admission: EM | Admit: 2022-10-01 | Discharge: 2022-10-01 | Disposition: A | Discharge status: Home / Self Care | Payer: BC Managed Care – PPO | Attending: Emergency Medicine | Admitting: Emergency Medicine

## 2022-10-01 DIAGNOSIS — J9801 Acute bronchospasm: Secondary | ICD-10-CM

## 2022-10-01 DIAGNOSIS — R0789 Other chest pain: Secondary | ICD-10-CM

## 2022-10-01 LAB — TROPONIN I (HIGH SENSITIVITY): Troponin I (High Sensitivity): 13 ng/L (ref <18)

## 2022-10-01 MED ORDER — ALBUTEROL SULFATE HFA 108 (90 BASE) MCG/ACT IN AERS
2.0000 | INHALATION_SPRAY | RESPIRATORY_TRACT | Status: DC | PRN
  Administered 2022-10-01: 2 via RESPIRATORY_TRACT
  Filled 2022-10-01: qty 6.7

## 2022-10-01 MED ORDER — METOPROLOL SUCCINATE ER 25 MG PO TB24
25.0000 mg | ORAL_TABLET | Freq: Every day | ORAL | Status: DC
  Administered 2022-10-01: 25 mg via ORAL
  Filled 2022-10-01: qty 1

## 2022-10-01 MED ORDER — AMLODIPINE BESYLATE 5 MG PO TABS
10.0000 mg | ORAL_TABLET | Freq: Once | ORAL | Status: AC
  Administered 2022-10-01: 10 mg via ORAL
  Filled 2022-10-01: qty 2

## 2022-10-01 NOTE — ED Provider Notes (Signed)
[facility] Provider Note: Janice Spurling, MD, FACEP  CSN: [id number] MRN: [id number] ARRIVAL: 09/30/22 at 2037 ROOM: [place]  Chest Pain   HISTORY OF PRESENT ILLNESS  10/01/22 12:49 AM Janice French is a 41 y.o. female who has had pain in her left upper chest and left posterior shoulder for about the past 2 weeks.  It is worse with movement, palpation and certain positions.  She rates it as a 10 out of 10.  It is usually constant but comes and goes at times.  She has also had intermittent shortness of breath and what she describes as a "really bad smoker's cough".  She is a smoker.  She also gets epigastric discomfort which she relates to a known hernia (hiatal?)  For which she takes omeprazole.  She was noted to have a blood pressure of 203/127 (rechecked 188/128) but admits she did not take her antihypertensives yesterday.   Past Medical History:  Diagnosis Date   Dysmenorrhea    Hypertension     Past Surgical History:  Procedure Laterality Date   ABDOMINAL AORTOGRAM W/LOWER EXTREMITY Bilateral 08/19/2020   Procedure: ABDOMINAL AORTOGRAM W/LOWER EXTREMITY;  Surgeon: Waynetta Sandy, MD;  Location: [facility];  Service: Cardiovascular;  Laterality: Bilateral;   CHOLECYSTECTOMY     PERIPHERAL VASCULAR INTERVENTION Left 08/19/2020   Procedure: PERIPHERAL VASCULAR INTERVENTION;  Surgeon: Waynetta Sandy, MD;  Location: [facility];  Service: Cardiovascular;  Laterality: Left;  Common iliac   TUBAL LIGATION      Family History  Problem Relation Age of Onset   Hypertension Other    Cancer Other     Social History   Tobacco Use   Smoking status: Every Day    Types: Cigars   Smokeless tobacco: Never  Vaping Use   Vaping Use: Former   Substances: Nicotine  Substance Use Topics   Alcohol use: No   Drug use: Not Currently    Prior to Admission medications   Medication Sig Start Date End Date Taking?  Authorizing Provider  amLODipine (NORVASC) 10 MG tablet Take 1 tablet (10 mg total) by mouth daily. 03/03/22   Smoot, Leary Roca, PA-C  ASPIRIN LOW DOSE 81 MG tablet Take 81 mg by mouth daily. 03/31/22   [provider]  Capsaicin-Menthol-Methyl Sal (CAPSAICIN-METHYL SAL-MENTHOL) 0.025-1-12 % CREA Apply 1 application topically every 6 (six) hours as needed. Rubbed onto your abdomen every 6 hours if needed for pain and nausea 06/23/19   Charlesetta Shanks, MD  hydrochlorothiazide (HYDRODIURIL) 25 MG tablet Take 1 tablet (25 mg total) by mouth daily. 03/04/22   Mayers, Cari S, PA-C  HYDROcodone-acetaminophen (NORCO) 7.5-325 MG tablet Take 1 tablet by mouth every 6 (six) hours as needed. 06/01/22   [provider]  ibuprofen (ADVIL) 600 MG tablet Take 1 tablet (600 mg total) by mouth every 6 (six) hours as needed. 06/18/22   Wilnette Kales, PA  metoprolol succinate (TOPROL-XL) 25 MG 24 hr tablet Take 25 mg by mouth daily. 07/08/22   [provider]  omeprazole (PRILOSEC) 20 MG capsule Take 1 capsule (20 mg total) by mouth daily. 03/04/22   Mayers, Cari S, PA-C  predniSONE (DELTASONE) 20 MG tablet Take 60 mg daily x 2 days then 40 mg daily x 2 days then 20 mg daily x 2 days 02/18/22   Drenda Freeze, MD  rosuvastatin (CRESTOR) 10 MG tablet Take 1 tablet (10 mg total) by mouth daily.  02/04/21 02/04/22  Horton, Barbette Hair, MD  promethazine (PHENERGAN) 25 MG tablet Take 1 tablet (25 mg total) by mouth every 6 (six) hours as needed for nausea or vomiting. 06/23/19 08/06/20  Charlesetta Shanks, MD    Allergies Patient has no known allergies.   REVIEW OF SYSTEMS  Negative except as noted here or in the History of Present Illness.   PHYSICAL EXAMINATION  Initial Vital Signs Blood pressure (!) 203/127, pulse 100, temperature 98.1 F (36.7 C), temperature source Oral, resp. rate 18, height 5' (1.524 m), weight 79.4 kg, last menstrual period 09/09/2022, SpO2 96 %.  Examination General:  Well-developed, well-nourished female in no acute distress; appearance consistent with age of record HENT: normocephalic; atraumatic Eyes: Normal appearance Neck: supple Heart: regular rate and rhythm Lungs: Faint inspiratory and expiratory wheezes in bases Chest: Left upper chest tenderness with pain on movement of left shoulder or neck Abdomen: soft; nondistended; nontender; bowel sounds present Extremities: No deformity; full range of motion; pulses normal Neurologic: Awake, alert and oriented; motor function intact in all extremities and symmetric; no facial droop Skin: Warm and dry Psychiatric: Normal mood and affect   RESULTS  Summary of this visit's results, reviewed and interpreted by myself:   EKG Interpretation  Date/Time:  Wednesday September 30 2022 20:47:38 EST Ventricular Rate:  102 PR Interval:  132 QRS Duration: 76 QT Interval:  360 QTC Calculation: 469 R Axis:   78 Text Interpretation: Sinus tachycardia Normal ECG No significant change was found Confirmed by Inara Dike [phone]) on 09/30/2022 10:50:20 PM       Laboratory Studies: Results for orders placed or performed during the hospital encounter of 10/01/22 (from the past 24 hour(s))  CBC with Differential     Status: None   Collection Time: 09/30/22  8:51 PM  Result Value Ref Range   WBC 7.7 4.0 - 10.5 K/uL   RBC 4.29 3.87 - 5.11 MIL/uL   Hemoglobin 13.6 12.0 - 15.0 g/dL   HCT 39.8 36.0 - 46.0 %   MCV 92.8 80.0 - 100.0 fL   MCH 31.7 26.0 - 34.0 pg   MCHC 34.2 30.0 - 36.0 g/dL   RDW 14.7 11.5 - 15.5 %   Platelets 325 150 - 400 K/uL   nRBC 0.0 0.0 - 0.2 %   Neutrophils Relative % 37 %   Neutro Abs 2.9 1.7 - 7.7 K/uL   Lymphocytes Relative 52 %   Lymphs Abs 4.0 0.7 - 4.0 K/uL   Monocytes Relative 7 %   Monocytes Absolute 0.6 0.1 - 1.0 K/uL   Eosinophils Relative 3 %   Eosinophils Absolute 0.2 0.0 - 0.5 K/uL   Basophils Relative 1 %   Basophils Absolute 0.0 0.0 - 0.1 K/uL   Immature Granulocytes 0  %   Abs Immature Granulocytes 0.01 0.00 - 0.07 K/uL  Comprehensive metabolic panel     Status: Abnormal   Collection Time: 09/30/22  8:51 PM  Result Value Ref Range   Sodium 138 135 - 145 mmol/L   Potassium 3.3 (L) 3.5 - 5.1 mmol/L   Chloride 106 98 - 111 mmol/L   CO2 25 22 - 32 mmol/L   Glucose, Bld 70 70 - 99 mg/dL   BUN 6 6 - 20 mg/dL   Creatinine, Ser 0.59 0.44 - 1.00 mg/dL   Calcium 8.6 (L) 8.9 - 10.3 mg/dL   Total Protein 7.7 6.5 - 8.1 g/dL   Albumin 3.6 3.5 - 5.0 g/dL   AST 18 15 -  41 U/L   ALT 11 0 - 44 U/L   Alkaline Phosphatase 92 38 - 126 U/L   Total Bilirubin 0.4 0.3 - 1.2 mg/dL   GFR, Estimated >01 >60 mL/min   Anion gap 7 5 - 15  Troponin I (High Sensitivity)     Status: None   Collection Time: 09/30/22  8:51 PM  Result Value Ref Range   Troponin I (High Sensitivity) 17 <18 ng/L  Troponin I (High Sensitivity)     Status: None   Collection Time: 10/01/22  1:10 AM  Result Value Ref Range   Troponin I (High Sensitivity) 13 <18 ng/L   Imaging Studies: DG Chest 2 View  Result Date: 09/30/2022 CLINICAL DATA:  Chest pain for 2 weeks. Shortness of breath and lightheadedness. EXAM: CHEST - 2 VIEW COMPARISON:  03/03/2022. FINDINGS: The heart size and mediastinal contours are within normal limits. Mild subsegmental atelectasis or scarring is noted in the mid left lung. No acute osseous abnormality. IMPRESSION: No active cardiopulmonary disease. Electronically Signed   By: Thornell Sartorius M.D.   On: 09/30/2022 21:05    ED COURSE and MDM  Nursing notes, initial and subsequent vitals signs, including pulse oximetry, reviewed and interpreted by myself.  Vitals:   09/30/22 2044 10/01/22 0101 10/01/22 0112 10/01/22 0130  BP: (!) 203/127 (!) 188/128  (!) 199/111  Pulse: 100 71  86  Resp: 18 (!) 21  12  Temp: 98.1 F (36.7 C) 98.3 F (36.8 C)    TempSrc: Oral Oral    SpO2: 96% 100% 98% 94%  Weight:      Height:       Medications  albuterol (VENTOLIN HFA) 108 (90 Base)  MCG/ACT inhaler 2 puff (2 puffs Inhalation Given 10/01/22 0107)  metoprolol succinate (TOPROL-XL) 24 hr tablet 25 mg (25 mg Oral Given 10/01/22 0116)  amLODipine (NORVASC) tablet 10 mg (10 mg Oral Given 10/01/22 0116)   1:58 AM Patient's EKG is normal and her troponins are within normal limits.  The nature of her pain is atypical for cardiac etiology.  It is more consistent with musculoskeletal pain as it is worse with movement or palpation.  I suspect her shortness of breath is due to early COPD from her smoking history.  She was given an albuterol inhaler and AeroChamber and instructed in their use.  Air movement has improved and her wheezing has resolved.  She feels better from a breathing standpoint.   PROCEDURES  Procedures   ED DIAGNOSES     ICD-10-CM   1. Chest wall pain  R07.89     2. Bronchospasm  J98.01          Trinnity Breunig, Jonny Ruiz, MD 10/01/22 0202

## 2022-10-02 NOTE — Progress Notes (Unsigned)
Patient ID: Janice French, female    DOB: [birth year]  MRN: [id number]  CC: Emergency Department Follow-Up  Subjective: Janice French is a 41 y.o. female who presents for emergency department follow-up.   Her concerns today include:  10/01/2022 [facility] Emergency Department per MD note: Patient's EKG is normal and her troponins are within normal limits.  The nature of her pain is atypical for cardiac etiology.  It is more consistent with musculoskeletal pain as it is worse with movement or palpation.  I suspect her shortness of breath is due to early COPD from her smoking history.  She was given an albuterol inhaler and AeroChamber and instructed in their use.  Air movement has improved and her wheezing has resolved.  She feels better from a breathing standpoint.   Today's visit 10/07/2022: Reports intermittent chest pain persisting. Sometimes feels left side goes numb. She is smoking a few Black & Milds daily with no plans to quit. Reports smoking helps decrease stress. Shortness of breath comes and goes. Albuterol inhaler helps some. 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 No further issues/concerns today.   Patient Active Problem List   Diagnosis Date Noted   History of left heart catheterization 05/19/2022   Other chest pain 05/19/2022   Encounter for orthopedic follow-up care 05/01/2021   Trigger thumb of right hand 03/31/2021   Carpal tunnel syndrome of right wrist 02/27/2020   Capsulitis 11/28/2019   Pain in elbow 11/28/2019   Ganglion cyst of dorsum of left wrist 11/28/2019   Essential hypertension 11/28/2019   Foot pain 06/10/2018   Acute ankle pain 06/10/2018     Current Outpatient Medications on File Prior to Visit  Medication Sig Dispense Refill   amLODipine (NORVASC) 10 MG tablet Take 1 tablet (10 mg total) by mouth daily. 30 tablet 1   ASPIRIN LOW DOSE 81 MG tablet Take 81 mg by mouth daily.      Capsaicin-Menthol-Methyl Sal (CAPSAICIN-METHYL SAL-MENTHOL) 0.025-1-12 % CREA Apply 1 application topically every 6 (six) hours as needed. Rubbed onto your abdomen every 6 hours if needed for pain and nausea 56.6 g 1   clopidogrel (PLAVIX) 75 MG tablet Take 1 tablet by mouth daily.     hydrochlorothiazide (HYDRODIURIL) 25 MG tablet Take 1 tablet (25 mg total) by mouth daily. 30 tablet 1   ibuprofen (ADVIL) 600 MG tablet Take 1 tablet (600 mg total) by mouth every 6 (six) hours as needed. 30 tablet 0   metoprolol succinate (TOPROL-XL) 25 MG 24 hr tablet Take 25 mg by mouth daily.     omeprazole (PRILOSEC) 20 MG capsule Take 1 capsule (20 mg total) by mouth daily. 30 capsule 3   rosuvastatin (CRESTOR) 10 MG tablet Take 1 tablet (10 mg total) by mouth daily. 30 tablet 11   [DISCONTINUED] promethazine (PHENERGAN) 25 MG tablet Take 1 tablet (25 mg total) by mouth every 6 (six) hours as needed for nausea or vomiting. 20 tablet 0   No current facility-administered medications on file prior to visit.    No Known Allergies  Social History   Socioeconomic History   Marital status: Single    Spouse name: Not on file   Number of children: Not on file   Years of education: Not on file   Highest education level: Not on file  Occupational History   Not on file  Tobacco Use   Smoking status: Every Day  Types: Cigars    Passive exposure: Current   Smokeless tobacco: Never  Vaping Use   Vaping Use: Former   Substances: Nicotine  Substance and Sexual Activity   Alcohol use: No   Drug use: Not Currently   Sexual activity: Never    Birth control/protection: Surgical  Other Topics Concern   Not on file  Social History Narrative   Not on file   Social Determinants of Health   Financial Resource Strain: Not on file  Food Insecurity: Not on file  Transportation Needs: Not on file  Physical Activity: Not on file  Stress: Not on file  Social Connections: Not on file  Intimate Partner  Violence: Not on file    Family History  Problem Relation Age of Onset   Hypertension Other    Cancer Other     Past Surgical History:  Procedure Laterality Date   ABDOMINAL AORTOGRAM W/LOWER EXTREMITY Bilateral 08/19/2020   Procedure: ABDOMINAL AORTOGRAM W/LOWER EXTREMITY;  Surgeon: Maeola Harman, MD;  Location: [facility];  Service: Cardiovascular;  Laterality: Bilateral;   CHOLECYSTECTOMY     PERIPHERAL VASCULAR INTERVENTION Left 08/19/2020   Procedure: PERIPHERAL VASCULAR INTERVENTION;  Surgeon: Maeola Harman, MD;  Location: [facility];  Service: Cardiovascular;  Laterality: Left;  Common iliac   TUBAL LIGATION      ROS: Review of Systems Negative except as stated above  PHYSICAL EXAM: BP (!) 157/79   Pulse 90   Temp 98.3 F (36.8 C)   Resp 16   Wt 174 lb (78.9 kg)   LMP 09/09/2022   SpO2 98%   BMI 33.98 kg/m   Physical Exam HENT:     Head: Normocephalic and atraumatic.  Eyes:     Extraocular Movements: Extraocular movements intact.     Conjunctiva/sclera: Conjunctivae normal.     Pupils: Pupils are equal, round, and reactive to light.  Cardiovascular:     Rate and Rhythm: Normal rate and regular rhythm.     Pulses: Normal pulses.     Heart sounds: Normal heart sounds.  Pulmonary:     Effort: Pulmonary effort is normal.     Breath sounds: Normal breath sounds.  Musculoskeletal:     Cervical back: Normal range of motion and neck supple.  Neurological:     General: No focal deficit present.     Mental Status: She is alert and oriented to person, place, and time.  Psychiatric:        Mood and Affect: Mood normal.        Behavior: Behavior normal.     ASSESSMENT AND PLAN: 1. Chest wall pain - Patient seen 10/01/2022 at [facility] Emergency Department and after workup it was determined chest wall pain is atypical for cardiac etiology. - Patient today in office with no cardiopulmonary distress.  - Referral to  Cardiology for further evaluation and management.  - Ambulatory referral to Cardiology  2. Bronchospasm 3. Dyspnea, unspecified type - Patient seen 10/01/2022 at [facility] Emergency Department and after workup it was determined symptoms likely related to early COPD from her smoking history. - Patient today in office with no cardiopulmonary distress.  - Continue present management.  - Referral to Pulmonology for further evaluation and management.  - Ambulatory referral to Pulmonology  4. Current smoker - Counseled to quit. Patient not ready to quit presently.   Patient was given the opportunity to ask questions.  Patient verbalized understanding of the plan and was  able to repeat key elements of the plan. Patient was given clear instructions to go to Emergency Department or return to medical center if symptoms don't improve, worsen, or new problems develop.The patient verbalized understanding.   Orders Placed This Encounter  Procedures   Ambulatory referral to Cardiology   Ambulatory referral to Pulmonology     Return in about 1 week (around 10/14/2022) for Follow-Up or next available Georganna Skeans, MD.  Rema Fendt, NP

## 2022-10-07 ENCOUNTER — Encounter: Payer: Self-pay | Admitting: Family Medicine

## 2022-10-07 ENCOUNTER — Ambulatory Visit (INDEPENDENT_AMBULATORY_CARE_PROVIDER_SITE_OTHER): Payer: BC Managed Care – PPO | Admitting: Family

## 2022-10-07 ENCOUNTER — Encounter: Payer: Self-pay | Admitting: Family

## 2022-10-07 VITALS — BP 157/79 | HR 90 | Temp 98.3°F | Resp 16 | Wt 174.0 lb

## 2022-10-07 DIAGNOSIS — R0600 Dyspnea, unspecified: Secondary | ICD-10-CM | POA: Diagnosis not present

## 2022-10-07 DIAGNOSIS — J9801 Acute bronchospasm: Secondary | ICD-10-CM | POA: Diagnosis not present

## 2022-10-07 DIAGNOSIS — R0789 Other chest pain: Secondary | ICD-10-CM | POA: Diagnosis not present

## 2022-10-07 DIAGNOSIS — F17200 Nicotine dependence, unspecified, uncomplicated: Secondary | ICD-10-CM

## 2022-10-07 NOTE — Progress Notes (Unsigned)
Pt presents for ED f/u from 11/9 -pt experiencing pressure on chest -all imaging normal

## 2022-10-12 ENCOUNTER — Ambulatory Visit: Payer: BC Managed Care – PPO | Attending: Cardiology | Admitting: Cardiology

## 2022-10-12 VITALS — BP 188/100 | HR 80 | Ht 60.0 in | Wt 179.1 lb

## 2022-10-12 DIAGNOSIS — I10 Essential (primary) hypertension: Secondary | ICD-10-CM | POA: Diagnosis not present

## 2022-10-12 DIAGNOSIS — Z95828 Presence of other vascular implants and grafts: Secondary | ICD-10-CM | POA: Insufficient documentation

## 2022-10-12 DIAGNOSIS — I209 Angina pectoris, unspecified: Secondary | ICD-10-CM

## 2022-10-12 DIAGNOSIS — E669 Obesity, unspecified: Secondary | ICD-10-CM | POA: Diagnosis not present

## 2022-10-12 DIAGNOSIS — F17210 Nicotine dependence, cigarettes, uncomplicated: Secondary | ICD-10-CM | POA: Insufficient documentation

## 2022-10-12 DIAGNOSIS — E782 Mixed hyperlipidemia: Secondary | ICD-10-CM | POA: Insufficient documentation

## 2022-10-12 DIAGNOSIS — N946 Dysmenorrhea, unspecified: Secondary | ICD-10-CM | POA: Insufficient documentation

## 2022-10-12 MED ORDER — NITROGLYCERIN 0.4 MG SL SUBL: 0.4000 mg | SUBLINGUAL_TABLET | SUBLINGUAL | 6 refills | Status: DC | PRN

## 2022-10-12 NOTE — Progress Notes (Signed)
Cardiology Office Note:    Date:  10/12/2022   ID:  Janice French, DOB [birth year], MRN [id number]  PCP:  Georganna Skeans, MD  Cardiologist:  Garwin Brothers, MD   Referring MD: Rema Fendt, NP    ASSESSMENT:    1. Essential hypertension   2. Presence of internal carotid stent   3. Cigarette smoker   4. Obesity (BMI 30.0-34.9)   5. Mixed dyslipidemia   6. Angina pectoris (HCC)    PLAN:    In order of problems listed above:  Primary prevention stressed with the patient.  Importance of compliance with diet medication stressed and she vocalized understanding. Chest pain: Atypical in nature.  I reassured her.  Coronary angiography earlier this year was unremarkable.  Her chest pain could possibly be from spasm from smoking and I told her that she could use nitroglycerin sublingually for this.  She understands. Cigarette smoker: I spent 5 minutes with the patient discussing solely about smoking. Smoking cessation was counseled. I suggested to the patient also different medications and pharmacological interventions. Patient is keen to try stopping on its own at this time. He will get back to me if he needs any further assistance in this matter. Obesity: Weight reduction stressed diet emphasized and lifestyle modification urged. Essential hypertension: She mentions to me that he has he has an element of whitecoat hypertension.  She will keep a track of blood pressures and follow-up with primary care for this. Mixed dyslipidemia: Lipids reviewed diet emphasized.  She will be seen in follow-up appointment on a as needed basis only.   Medication Adjustments/Labs and Tests Ordered: Current medicines are reviewed at length with the patient today.  Concerns regarding medicines are outlined above.  Orders Placed This Encounter  Procedures   EKG 12-Lead   ECHOCARDIOGRAM COMPLETE   Meds ordered this encounter  Medications   nitroGLYCERIN (NITROSTAT) 0.4 MG SL tablet    Sig: Place 1  tablet (0.4 mg total) under the tongue every 5 (five) minutes as needed.    Dispense:  25 tablet    Refill:  6     History of Present Illness:    Janice French is a 41 y.o. female who is being seen today for the evaluation of chest pain at the request of Rema Fendt, NP.  Patient is a pleasant 41 year old female.  She has past medical history of essential hypertension, dyslipidemia and unfortunately continues to smoke.  She is referred here for chest pain.  Coronary angiography from June this year revealed no significant disease.  She denies any chest pain or orthopnea or PND at this time.  She mentions to me that occasionally she has chest discomfort with some radiation to the left arm.  No orthopnea or PND.  At the time of my evaluation, the patient is alert awake oriented and in no distress.  Chest pain does not come on exercise.  Past Medical History:  Diagnosis Date   Acute ankle pain 06/10/2018   Capsulitis 11/28/2019   Carpal tunnel syndrome of right wrist 02/27/2020   Dysmenorrhea    Encounter for orthopedic follow-up care 05/01/2021   Essential hypertension 11/28/2019   Foot pain 06/10/2018   Ganglion cyst of dorsum of left wrist 11/28/2019   History of left heart catheterization 05/19/2022   Hypertension    Other chest pain 05/19/2022   Pain in elbow 11/28/2019   Trigger thumb of right hand 03/31/2021    Past Surgical History:  Procedure Laterality Date  ABDOMINAL AORTOGRAM W/LOWER EXTREMITY Bilateral 08/19/2020   Procedure: ABDOMINAL AORTOGRAM W/LOWER EXTREMITY;  Surgeon: Maeola Harman, MD;  Location: [facility] INVASIVE CV LAB;  Service: Cardiovascular;  Laterality: Bilateral;   CHOLECYSTECTOMY     PERIPHERAL VASCULAR INTERVENTION Left 08/19/2020   Procedure: PERIPHERAL VASCULAR INTERVENTION;  Surgeon: Maeola Harman, MD;  Location: [facility] INVASIVE CV LAB;  Service: Cardiovascular;  Laterality: Left;  Common iliac   TUBAL LIGATION      Current  Medications: Current Meds  Medication Sig   amLODipine (NORVASC) 10 MG tablet Take 1 tablet (10 mg total) by mouth daily.   ASPIRIN LOW DOSE 81 MG tablet Take 81 mg by mouth daily.   Capsaicin-Menthol-Methyl Sal (CAPSAICIN-METHYL SAL-MENTHOL) 0.025-1-12 % CREA Apply 1 application topically every 6 (six) hours as needed. Rubbed onto your abdomen every 6 hours if needed for pain and nausea   clopidogrel (PLAVIX) 75 MG tablet Take 1 tablet by mouth daily.   hydrochlorothiazide (HYDRODIURIL) 25 MG tablet Take 1 tablet (25 mg total) by mouth daily.   ibuprofen (ADVIL) 600 MG tablet Take 1 tablet (600 mg total) by mouth every 6 (six) hours as needed.   metoprolol succinate (TOPROL-XL) 25 MG 24 hr tablet Take 25 mg by mouth daily.   nitroGLYCERIN (NITROSTAT) 0.4 MG SL tablet Place 1 tablet (0.4 mg total) under the tongue every 5 (five) minutes as needed.   omeprazole (PRILOSEC) 20 MG capsule Take 1 capsule (20 mg total) by mouth daily.     Allergies:   Patient has no known allergies.   Social History   Socioeconomic History   Marital status: Single    Spouse name: Not on file   Number of children: Not on file   Years of education: Not on file   Highest education level: Not on file  Occupational History   Not on file  Tobacco Use   Smoking status: Every Day    Types: Cigars    Passive exposure: Current   Smokeless tobacco: Never  Vaping Use   Vaping Use: Former   Substances: Nicotine  Substance and Sexual Activity   Alcohol use: No   Drug use: Not Currently   Sexual activity: Never    Birth control/protection: Surgical  Other Topics Concern   Not on file  Social History Narrative   Not on file   Social Determinants of Health   Financial Resource Strain: Not on file  Food Insecurity: Not on file  Transportation Needs: Not on file  Physical Activity: Not on file  Stress: Not on file  Social Connections: Not on file     Family History: The patient's family history includes  Cancer in an other family member; Hypertension in an other family member.  ROS:   Please see the history of present illness.    All other systems reviewed and are negative.  EKGs/Labs/Other Studies Reviewed:    The following studies were reviewed today: EKG reveals sinus rhythm septal infarction and nonspecific ST-T changes   Recent Labs: 03/04/2022: TSH 2.040 09/30/2022: ALT 11; BUN 6; Creatinine, Ser 0.59; Hemoglobin 13.6; Platelets 325; Potassium 3.3; Sodium 138  Recent Lipid Panel    Component Value Date/Time   CHOL 195 03/04/2022 1006   TRIG 81 03/04/2022 1006   HDL 48 03/04/2022 1006   CHOLHDL 4.1 03/04/2022 1006   LDLCALC 132 (H) 03/04/2022 1006    Physical Exam:    VS:  BP (!) 188/100 (BP Location: Right Arm, Patient Position: Sitting, Cuff Size: Normal)  Pulse 80   Ht 5' (1.524 m)   Wt 179 lb 1.3 oz (81.2 kg)   LMP 09/09/2022   SpO2 99%   BMI 34.97 kg/m     Wt Readings from Last 3 Encounters:  10/12/22 179 lb 1.3 oz (81.2 kg)  10/07/22 174 lb (78.9 kg)  09/30/22 175 lb (79.4 kg)     GEN: Patient is in no acute distress HEENT: Normal NECK: No JVD; No carotid bruits LYMPHATICS: No lymphadenopathy CARDIAC: S1 S2 regular, 2/6 systolic murmur at the apex. RESPIRATORY:  Clear to auscultation without rales, wheezing or rhonchi  ABDOMEN: Soft, non-tender, non-distended MUSCULOSKELETAL:  No edema; No deformity  SKIN: Warm and dry NEUROLOGIC:  Alert and oriented x 3 PSYCHIATRIC:  Normal affect    Signed, Garwin Brothers, MD  10/12/2022 10:02 AM    [facility]

## 2022-10-12 NOTE — Patient Instructions (Addendum)
Medication Instructions:  Your physician has recommended you make the following change in your medication:   Use nitroglycerin 1 tablet placed under the tongue at the first sign of chest pain or an angina attack. 1 tablet may be used every 5 minutes as needed, for up to 15 minutes. Do not take more than 3 tablets in 15 minutes. If pain persist call 911 or go to the nearest ED.   *If you need a refill on your cardiac medications before your next appointment, please call your pharmacy*   Lab Work: None ordered If you have labs (blood work) drawn today and your tests are completely normal, you will receive your results only by: MyChart Message (if you have MyChart) OR A paper copy in the mail If you have any lab test that is abnormal or we need to change your treatment, we will call you to review the results.   Testing/Procedures: Your physician has requested that you have an echocardiogram. Echocardiography is a painless test that uses sound waves to create images of your heart. It provides your doctor with information about the size and shape of your heart and how well your heart's chambers and valves are working. This procedure takes approximately one hour. There are no restrictions for this procedure.    Follow-Up: At [facility], you and your health needs are our priority.  As part of our continuing mission to provide you with exceptional heart care, we have created designated Provider Care Teams.  These Care Teams include your primary Cardiologist (physician) and Advanced Practice Providers (APPs -  Physician Assistants and Nurse Practitioners) who all work together to provide you with the care you need, when you need it.  We recommend signing up for the patient portal called "MyChart".  Sign up information is provided on this After Visit Summary.  MyChart is used to connect with patients for Virtual Visits (Telemedicine).  Patients are able to view lab/test results, encounter notes,  upcoming appointments, etc.  Non-urgent messages can be sent to your provider as well.   To learn more about what you can do with MyChart, go to [email or web address].    Your next appointment:   12 month(s)  The format for your next appointment:   In Person  Provider:   Belva Crome, MD   Other Instructions Echocardiogram An echocardiogram is a test that uses sound waves (ultrasound) to produce images of the heart. Images from an echocardiogram can provide important information about: Heart size and shape. The size and thickness and movement of your heart's walls. Heart muscle function and strength. Heart valve function or if you have stenosis. Stenosis is when the heart valves are too narrow. If blood is flowing backward through the heart valves (regurgitation). A tumor or infectious growth around the heart valves. Areas of heart muscle that are not working well because of poor blood flow or injury from a heart attack. Aneurysm detection. An aneurysm is a weak or damaged part of an artery wall. The wall bulges out from the normal force of blood pumping through the body. Tell a health care provider about: Any allergies you have. All medicines you are taking, including vitamins, herbs, eye drops, creams, and over-the-counter medicines. Any blood disorders you have. Any surgeries you have had. Any medical conditions you have. Whether you are pregnant or may be pregnant. What are the risks? Generally, this is a safe test. However, problems may occur, including an allergic reaction to dye (contrast) that may be used during  the test. What happens before the test? No specific preparation is needed. You may eat and drink normally. What happens during the test? You will take off your clothes from the waist up and put on a hospital gown. Electrodes or electrocardiogram (ECG)patches may be placed on your chest. The electrodes or patches are then connected to a device that monitors  your heart rate and rhythm. You will lie down on a table for an ultrasound exam. A gel will be applied to your chest to help sound waves pass through your skin. A handheld device, called a transducer, will be pressed against your chest and moved over your heart. The transducer produces sound waves that travel to your heart and bounce back (or "echo" back) to the transducer. These sound waves will be captured in real-time and changed into images of your heart that can be viewed on a video monitor. The images will be recorded on a computer and reviewed by your health care provider. You may be asked to change positions or hold your breath for a short time. This makes it easier to get different views or better views of your heart. In some cases, you may receive contrast through an IV in one of your veins. This can improve the quality of the pictures from your heart. The procedure may vary among health care providers and hospitals.   What can I expect after the test? You may return to your normal, everyday life, including diet, activities, and medicines, unless your health care provider tells you not to do that. Follow these instructions at home: It is up to you to get the results of your test. Ask your health care provider, or the department that is doing the test, when your results will be ready. Keep all follow-up visits. This is important. Summary An echocardiogram is a test that uses sound waves (ultrasound) to produce images of the heart. Images from an echocardiogram can provide important information about the size and shape of your heart, heart muscle function, heart valve function, and other possible heart problems. You do not need to do anything to prepare before this test. You may eat and drink normally. After the echocardiogram is completed, you may return to your normal, everyday life, unless your health care provider tells you not to do that. This information is not intended to replace  advice given to you by your health care provider. Make sure you discuss any questions you have with your health care provider. Document Revised: 07/02/2020 Document Reviewed: 07/02/2020 Elsevier Patient Education  2021 [employer].

## 2022-10-13 ENCOUNTER — Ambulatory Visit: Payer: BC Managed Care – PPO | Admitting: Family Medicine

## 2022-10-14 ENCOUNTER — Ambulatory Visit: Payer: BC Managed Care – PPO | Admitting: Physician Assistant

## 2022-10-29 ENCOUNTER — Ambulatory Visit (INDEPENDENT_AMBULATORY_CARE_PROVIDER_SITE_OTHER): Payer: BC Managed Care – PPO | Admitting: Pulmonary Disease

## 2022-10-29 ENCOUNTER — Encounter: Payer: Self-pay | Admitting: Pulmonary Disease

## 2022-10-29 VITALS — BP 140/84 | HR 96 | Temp 97.8°F | Ht 60.0 in | Wt 177.4 lb

## 2022-10-29 DIAGNOSIS — J4520 Mild intermittent asthma, uncomplicated: Secondary | ICD-10-CM

## 2022-10-29 DIAGNOSIS — R079 Chest pain, unspecified: Secondary | ICD-10-CM

## 2022-10-29 DIAGNOSIS — R0602 Shortness of breath: Secondary | ICD-10-CM

## 2022-10-29 MED ORDER — FLUTICASONE-SALMETEROL 115-21 MCG/ACT IN AERO: 2.0000 | INHALATION_SPRAY | Freq: Two times a day (BID) | RESPIRATORY_TRACT | 12 refills | Status: AC

## 2022-10-29 NOTE — Patient Instructions (Signed)
Start advair inhaler 2 puffs twice daily - rinse mouth out after each use  We will check a CT Chest scan to rule out blood clots  Follow up in 2 months with pulmonary function tests

## 2022-10-29 NOTE — Progress Notes (Signed)
Synopsis: Referred in December 2023 for shortness of breath by Ricky Stabs, NP  Subjective:   PATIENT ID: Janice French GENDER: female DOB: 1981/08/10, MRN: [id number]  HPI  Chief Complaint  Patient presents with   Consult    Chest pain, chest feels heavy.  Seen cardiology.  All tests normal per pt.  Scheduled ECHO tomorrow.   Janice French is a 41 year old woman, daily cigar smoker with hypertension who is referred to pulmonary clinic for shortness of breath.   She reports having shortness of breath and chest pain episodes that started earlier this year.  She has been evaluated by cardiology with normal appearing echo.  She reports episodes of chest pressure that can take her breath that can last up to 45 minutes.  She does have shortness of breath with the chest pains.  She also experiences shortness of breath with exertion.  She denies any issues with wheezing.  She denies lower extremity edema.  She denies any cough or mucus production.  She also reports abdominal pain and nausea with the chest symptoms.  She is currently smoking 5 black and milds per day.  She has been smoking since age 45.  She was previously smoking cigarettes about 2 packs/day throughout her 37s and 30s.  She currently works at [employer] in [employer] where she is exposed to lots of wood dust.  She also smokes marijuana on a regular basis.  Past Medical History:  Diagnosis Date   Acute ankle pain 06/10/2018   Capsulitis 11/28/2019   Carpal tunnel syndrome of right wrist 02/27/2020   Dysmenorrhea    Encounter for orthopedic follow-up care 05/01/2021   Essential hypertension 11/28/2019   Foot pain 06/10/2018   Ganglion cyst of dorsum of left wrist 11/28/2019   History of left heart catheterization 05/19/2022   Hypertension    Other chest pain 05/19/2022   Pain in elbow 11/28/2019   Trigger thumb of right hand 03/31/2021     Family History  Problem Relation Age of  Onset   Hypertension Other    Cancer Other      Social History   Socioeconomic History   Marital status: Single    Spouse name: Not on file   Number of children: Not on file   Years of education: Not on file   Highest education level: Not on file  Occupational History   Not on file  Tobacco Use   Smoking status: Every Day    Types: Cigars    Start date: 1995    Passive exposure: Current   Smokeless tobacco: Never   Tobacco comments:    Hx of vaping.  Pt started smoking at age 35.  Smokes [employer]  Smokes about 5 cigars a day  Vaping Use   Vaping Use: Former   Substances: Nicotine  Substance and Sexual Activity   Alcohol use: No   Drug use: Not Currently   Sexual activity: Never    Birth control/protection: Surgical  Other Topics Concern   Not on file  Social History Narrative   Not on file   Social Determinants of Health   Financial Resource Strain: Not on file  Food Insecurity: Not on file  Transportation Needs: Not on file  Physical Activity: Not on file  Stress: Not on file  Social Connections: Not on file  Intimate Partner Violence: Not on file     No Known Allergies   Outpatient Medications Prior to  Visit  Medication Sig Dispense Refill   amLODipine (NORVASC) 10 MG tablet Take 1 tablet (10 mg total) by mouth daily. 30 tablet 1   ASPIRIN LOW DOSE 81 MG tablet Take 81 mg by mouth daily.     Capsaicin-Menthol-Methyl Sal (CAPSAICIN-METHYL SAL-MENTHOL) 0.025-1-12 % CREA Apply 1 application topically every 6 (six) hours as needed. Rubbed onto your abdomen every 6 hours if needed for pain and nausea 56.6 g 1   clopidogrel (PLAVIX) 75 MG tablet Take 1 tablet by mouth daily.     hydrochlorothiazide (HYDRODIURIL) 25 MG tablet Take 1 tablet (25 mg total) by mouth daily. 30 tablet 1   ibuprofen (ADVIL) 600 MG tablet Take 1 tablet (600 mg total) by mouth every 6 (six) hours as needed. 30 tablet 0   metoprolol succinate (TOPROL-XL) 25 MG 24 hr tablet  Take 25 mg by mouth daily.     nitroGLYCERIN (NITROSTAT) 0.4 MG SL tablet Place 1 tablet (0.4 mg total) under the tongue every 5 (five) minutes as needed. 25 tablet 6   omeprazole (PRILOSEC) 20 MG capsule Take 1 capsule (20 mg total) by mouth daily. 30 capsule 3   rosuvastatin (CRESTOR) 10 MG tablet Take 1 tablet (10 mg total) by mouth daily. 30 tablet 11   No facility-administered medications prior to visit.    Review of Systems  Constitutional:  Negative for chills, fever, malaise/fatigue and weight loss.  HENT:  Negative for congestion, sinus pain and sore throat.   Eyes: Negative.   Respiratory:  Positive for shortness of breath. Negative for cough, hemoptysis, sputum production and wheezing.   Cardiovascular:  Positive for chest pain. Negative for palpitations, orthopnea, claudication and leg swelling.  Gastrointestinal:  Negative for abdominal pain, heartburn, nausea and vomiting.  Genitourinary: Negative.   Musculoskeletal:  Negative for joint pain and myalgias.  Skin:  Negative for rash.  Neurological:  Negative for weakness.  Endo/Heme/Allergies: Negative.   Psychiatric/Behavioral: Negative.     Objective:   Vitals:   10/29/22 1423  BP: (!) 140/84  Pulse: 96  Temp: 97.8 F (36.6 C)  TempSrc: Oral  SpO2: 98%  Weight: 177 lb 6.4 oz (80.5 kg)  Height: 5' (1.524 m)     Physical Exam Constitutional:      General: She is not in acute distress.    Appearance: She is not ill-appearing.  HENT:     Head: Normocephalic and atraumatic.  Eyes:     General: No scleral icterus.    Conjunctiva/sclera: Conjunctivae normal.     Pupils: Pupils are equal, round, and reactive to light.  Cardiovascular:     Rate and Rhythm: Normal rate and regular rhythm.     Pulses: Normal pulses.     Heart sounds: Normal heart sounds. No murmur heard. Pulmonary:     Effort: Pulmonary effort is normal.     Breath sounds: Normal breath sounds. Decreased air movement present. No wheezing,  rhonchi or rales.  Abdominal:     General: Bowel sounds are normal.     Palpations: Abdomen is soft.  Musculoskeletal:     Right lower leg: No edema.     Left lower leg: No edema.  Lymphadenopathy:     Cervical: No cervical adenopathy.  Skin:    General: Skin is warm and dry.  Neurological:     General: No focal deficit present.     Mental Status: She is alert.  Psychiatric:        Mood and Affect: Mood normal.  Behavior: Behavior normal.        Thought Content: Thought content normal.        Judgment: Judgment normal.    CBC    Component Value Date/Time   WBC 7.7 09/30/2022 2051   RBC 4.29 09/30/2022 2051   HGB 13.6 09/30/2022 2051   HCT 39.8 09/30/2022 2051   PLT 325 09/30/2022 2051   MCV 92.8 09/30/2022 2051   MCH 31.7 09/30/2022 2051   MCHC 34.2 09/30/2022 2051   RDW 14.7 09/30/2022 2051   LYMPHSABS 4.0 09/30/2022 2051   MONOABS 0.6 09/30/2022 2051   EOSABS 0.2 09/30/2022 2051   BASOSABS 0.0 09/30/2022 2051      Latest Ref Rng & Units 09/30/2022    8:51 PM 06/18/2022    9:47 PM 03/04/2022   10:06 AM  BMP  Glucose 70 - 99 mg/dL 70  924  268   BUN 6 - 20 mg/dL 6  7  5    Creatinine 0.44 - 1.00 mg/dL  3.41  9.62   BUN/Creat Ratio 9 - 23   8   Sodium 135 - 145 mmol/L 138  137  138   Potassium 3.5 - 5.1 mmol/L 3.3  3.7  4.2   Chloride 98 - 111 mmol/L 106  105  103   CO2 22 - 32 mmol/L 25  25    Calcium 8.9 - 10.3 mg/dL 8.6  8.8  9.5    Chest imaging: CXR 09/30/22 The heart size and mediastinal contours are within normal limits. Mild subsegmental atelectasis or scarring is noted in the mid left lung. No acute osseous abnormality.  PFT:     No data to display          Labs:  Path:  Echo:  Heart Catheterization:  Assessment & Plan:   Shortness of breath - Plan: CT Angio Chest Pulmonary Embolism (PE) W or WO Contrast  Mild intermittent reactive airway disease without complication - Plan: fluticasone-salmeterol (ADVAIR HFA) 115-21  MCG/ACT inhaler, Pulmonary Function Test  Chest pain, unspecified type - Plan: CT Angio Chest Pulmonary Embolism (PE) W or WO Contrast  Discussion: Janice French is a 41 year old woman, daily cigar smoker with hypertension who is referred to pulmonary clinic for shortness of breath.   We will check a CTA chest to rule out pulmonary emboli given her on going chest pains.   She is to start advair HFA 115-35mcg 2 puffs twice daily and monitor for symptom improvement. She is high risk for obstructive lung disease given her smoking history and occupational dust exposures.   She is to follow up in 2 months with PFTs.  22m, MD [facility] Office: [phone]   Current Outpatient Medications:    amLODipine (NORVASC) 10 MG tablet, Take 1 tablet (10 mg total) by mouth daily., Disp: 30 tablet, Rfl: 1   ASPIRIN LOW DOSE 81 MG tablet, Take 81 mg by mouth daily., Disp: , Rfl:    Capsaicin-Menthol-Methyl Sal (CAPSAICIN-METHYL SAL-MENTHOL) 0.025-1-12 % CREA, Apply 1 application topically every 6 (six) hours as needed. Rubbed onto your abdomen every 6 hours if needed for pain and nausea, Disp: 56.6 g, Rfl: 1   clopidogrel (PLAVIX) 75 MG tablet, Take 1 tablet by mouth daily., Disp: , Rfl:    fluticasone-salmeterol (ADVAIR HFA) 115-21 MCG/ACT inhaler, Inhale 2 puffs into the lungs 2 (two) times daily., Disp: 1 each, Rfl: 12   hydrochlorothiazide (HYDRODIURIL) 25 MG tablet, Take 1 tablet (25 mg total)  by mouth daily., Disp: 30 tablet, Rfl: 1   ibuprofen (ADVIL) 600 MG tablet, Take 1 tablet (600 mg total) by mouth every 6 (six) hours as needed., Disp: 30 tablet, Rfl: 0   metoprolol succinate (TOPROL-XL) 25 MG 24 hr tablet, Take 25 mg by mouth daily., Disp: , Rfl:    nitroGLYCERIN (NITROSTAT) 0.4 MG SL tablet, Place 1 tablet (0.4 mg total) under the tongue every 5 (five) minutes as needed., Disp: 25 tablet, Rfl: 6   omeprazole (PRILOSEC) 20 MG capsule, Take 1 capsule (20 mg  total) by mouth daily., Disp: 30 capsule, Rfl: 3   rosuvastatin (CRESTOR) 10 MG tablet, Take 1 tablet (10 mg total) by mouth daily., Disp: 30 tablet, Rfl: 11

## 2022-10-30 ENCOUNTER — Encounter: Payer: Self-pay | Admitting: Pulmonary Disease

## 2022-10-30 ENCOUNTER — Ambulatory Visit (HOSPITAL_BASED_OUTPATIENT_CLINIC_OR_DEPARTMENT_OTHER): Payer: BC Managed Care – PPO

## 2022-10-30 DIAGNOSIS — I209 Angina pectoris, unspecified: Secondary | ICD-10-CM

## 2022-10-30 DIAGNOSIS — R079 Chest pain, unspecified: Secondary | ICD-10-CM | POA: Diagnosis not present

## 2022-10-30 DIAGNOSIS — R0602 Shortness of breath: Secondary | ICD-10-CM | POA: Diagnosis not present

## 2022-10-30 LAB — ECHOCARDIOGRAM COMPLETE
Area-P 1/2: 4.31 cm2
MV M vel: 4.15 m/s
MV Peak grad: 68.9 mmHg
S' Lateral: 3.2 cm

## 2022-10-31 ENCOUNTER — Ambulatory Visit (HOSPITAL_BASED_OUTPATIENT_CLINIC_OR_DEPARTMENT_OTHER)
Admission: RE | Admit: 2022-10-31 | Discharge: 2022-10-31 | Disposition: A | Discharge status: Home / Self Care | Payer: BC Managed Care – PPO | Source: Ambulatory Visit | Attending: Pulmonary Disease | Admitting: Pulmonary Disease

## 2022-10-31 ENCOUNTER — Encounter (HOSPITAL_BASED_OUTPATIENT_CLINIC_OR_DEPARTMENT_OTHER): Payer: Self-pay

## 2022-10-31 DIAGNOSIS — I209 Angina pectoris, unspecified: Secondary | ICD-10-CM | POA: Insufficient documentation

## 2022-10-31 DIAGNOSIS — R079 Chest pain, unspecified: Secondary | ICD-10-CM | POA: Diagnosis not present

## 2022-10-31 DIAGNOSIS — R0602 Shortness of breath: Secondary | ICD-10-CM | POA: Diagnosis not present

## 2022-10-31 DIAGNOSIS — J929 Pleural plaque without asbestos: Secondary | ICD-10-CM | POA: Diagnosis not present

## 2022-10-31 DIAGNOSIS — R918 Other nonspecific abnormal finding of lung field: Secondary | ICD-10-CM | POA: Diagnosis not present

## 2022-10-31 MED ORDER — IOHEXOL 350 MG/ML SOLN
80.0000 mL | Freq: Once | INTRAVENOUS | Status: AC | PRN
  Administered 2022-10-31: 80 mL via INTRAVENOUS

## 2022-11-04 ENCOUNTER — Telehealth: Payer: Self-pay | Admitting: Cardiology

## 2022-11-04 NOTE — Telephone Encounter (Signed)
Left VM to call back 

## 2022-11-04 NOTE — Telephone Encounter (Signed)
Follow Up:     Patient is returning a call, concerning her Echo results. 

## 2022-11-05 NOTE — Telephone Encounter (Signed)
Patient informed of results.  

## 2022-11-05 NOTE — Telephone Encounter (Signed)
Left message for the patient to call back.

## 2022-11-18 ENCOUNTER — Telehealth: Payer: Self-pay | Admitting: *Deleted

## 2022-11-18 NOTE — Telephone Encounter (Signed)
Called and spoke with pt. Letting her know that someone had tried to call her to go over recent CT results and I went over that with pt. Scheduled pt for PFT and OV after with JD. Nothing further needed.

## 2022-11-27 ENCOUNTER — Encounter (HOSPITAL_BASED_OUTPATIENT_CLINIC_OR_DEPARTMENT_OTHER): Payer: Self-pay

## 2022-11-27 ENCOUNTER — Other Ambulatory Visit: Payer: Self-pay

## 2022-11-27 ENCOUNTER — Emergency Department (HOSPITAL_BASED_OUTPATIENT_CLINIC_OR_DEPARTMENT_OTHER)
Admission: EM | Admit: 2022-11-27 | Discharge: 2022-11-27 | Disposition: A | Discharge status: Home / Self Care | Payer: BC Managed Care – PPO | Attending: Emergency Medicine | Admitting: Emergency Medicine

## 2022-11-27 DIAGNOSIS — Z7982 Long term (current) use of aspirin: Secondary | ICD-10-CM | POA: Diagnosis not present

## 2022-11-27 DIAGNOSIS — I10 Essential (primary) hypertension: Secondary | ICD-10-CM | POA: Insufficient documentation

## 2022-11-27 DIAGNOSIS — Z79899 Other long term (current) drug therapy: Secondary | ICD-10-CM | POA: Diagnosis not present

## 2022-11-27 DIAGNOSIS — I739 Peripheral vascular disease, unspecified: Secondary | ICD-10-CM | POA: Insufficient documentation

## 2022-11-27 DIAGNOSIS — M79675 Pain in left toe(s): Secondary | ICD-10-CM | POA: Diagnosis not present

## 2022-11-27 MED ORDER — HYDROCODONE-ACETAMINOPHEN 5-325 MG PO TABS
1.0000 | ORAL_TABLET | Freq: Once | ORAL | Status: AC
  Administered 2022-11-27: 1 via ORAL
  Filled 2022-11-27: qty 1

## 2022-11-27 NOTE — ED Triage Notes (Addendum)
Pt states left great toe pain for past 2 weeks.  Denies injury, states wears steel toed shoes for work. Has had clot in past, on plavix, htn meds.

## 2022-11-27 NOTE — ED Provider Notes (Signed)
[facility] Provider Note   CSN: [id number] Arrival date & time: 11/27/22  0220     History  Chief Complaint  Patient presents with   Toe Pain    Janice French is a 42 y.o. female.   Toe Pain This is a new problem. The current episode started more than 1 week ago. The problem occurs daily. The problem has been gradually worsening. The symptoms are aggravated by walking. The symptoms are relieved by rest.  Patient with history of hypertension and peripheral vascular disease presents with left  great toe pain.  This has  been ongoing for over a month.  She at first thought it was due to wearing steel toed shoes at work.  She reports it typically gets worse after a 10-hour shift. No trauma She can still ambulate No weakness/numbness Previous h/o PVD with stent placed in left LE She just quit smoking Still taking plavix No h/o gout  Patient also reports increasing pain in her left thigh with exertion over the past several weeks Past Medical History:  Diagnosis Date   Acute ankle pain 06/10/2018   Capsulitis 11/28/2019   Carpal tunnel syndrome of right wrist 02/27/2020   Dysmenorrhea    Encounter for orthopedic follow-up care 05/01/2021   Essential hypertension 11/28/2019   Foot pain 06/10/2018   Ganglion cyst of dorsum of left wrist 11/28/2019   History of left heart catheterization 05/19/2022   Hypertension    Other chest pain 05/19/2022   Pain in elbow 11/28/2019   Trigger thumb of right hand 03/31/2021    Home Medications Prior to Admission medications   Medication Sig Start Date End Date Taking? Authorizing Provider  amLODipine (NORVASC) 10 MG tablet Take 1 tablet (10 mg total) by mouth daily. 03/03/22   Smoot, Leary Roca, PA-C  ASPIRIN LOW DOSE 81 MG tablet Take 81 mg by mouth daily. 03/31/22   [provider]  Capsaicin-Menthol-Methyl Sal (CAPSAICIN-METHYL SAL-MENTHOL) 0.025-1-12 % CREA Apply 1 application topically every 6 (six)  hours as needed. Rubbed onto your abdomen every 6 hours if needed for pain and nausea 06/23/19   Charlesetta Shanks, MD  clopidogrel (PLAVIX) 75 MG tablet Take 1 tablet by mouth daily.    [provider]  fluticasone-salmeterol (ADVAIR HFA) 115-21 MCG/ACT inhaler Inhale 2 puffs into the lungs 2 (two) times daily. 10/29/22   Freddi Starr, MD  hydrochlorothiazide (HYDRODIURIL) 25 MG tablet Take 1 tablet (25 mg total) by mouth daily. 03/04/22   Mayers, Cari S, PA-C  ibuprofen (ADVIL) 600 MG tablet Take 1 tablet (600 mg total) by mouth every 6 (six) hours as needed. 06/18/22   Wilnette Kales, PA  metoprolol succinate (TOPROL-XL) 25 MG 24 hr tablet Take 25 mg by mouth daily. 07/08/22   [provider]  nitroGLYCERIN (NITROSTAT) 0.4 MG SL tablet Place 1 tablet (0.4 mg total) under the tongue every 5 (five) minutes as needed. 10/12/22 01/10/23  Revankar, Reita Cliche, MD  omeprazole (PRILOSEC) 20 MG capsule Take 1 capsule (20 mg total) by mouth daily. 03/04/22   Mayers, Cari S, PA-C  rosuvastatin (CRESTOR) 10 MG tablet Take 1 tablet (10 mg total) by mouth daily. 02/04/21 02/04/22  Horton, Barbette Hair, MD  promethazine (PHENERGAN) 25 MG tablet Take 1 tablet (25 mg total) by mouth every 6 (six) hours as needed for nausea or vomiting. 06/23/19 08/06/20  Charlesetta Shanks, MD      Allergies    Patient has no known allergies.  Review of Systems   Review of Systems  Musculoskeletal:  Positive for arthralgias.    Physical Exam Updated Vital Signs BP (!) 192/104 (BP Location: Right Arm)   Pulse 96   Temp 98.4 F (36.9 C) (Oral)   Resp 18   Ht 1.524 m (5')   Wt 79.4 kg   SpO2 100%   BMI 34.18 kg/m  Physical Exam CONSTITUTIONAL: Well developed/well nourished, no distress HEAD: Normocephalic/atraumatic NECK: supple no meningeal signs CV: S1/S2 noted, no murmurs/rubs/gallops noted LUNGS: Lungs are clear to auscultation bilaterally, no apparent distress NEURO: Pt is awake/alert/appropriate,  moves all extremitiesx4.  No facial droop.  No sensory deficit to either foot Full flexion/extension of both feet.   EXTREMITIES: pulses normal/equal, full ROM Distal pulses are intact by palpation of the right foot. DP and PT pulses found in left foot by Doppler only. No wounds noted to feet.  No wounds noted to the webspaces.  No crepitus to the feet. See photo below.  Feet appear symmetric. SKIN: warm, color normal, both feet are warm to touch PSYCH: no abnormalities of mood noted, alert and oriented to situation      ED Results / Procedures / Treatments   Labs (all labs ordered are listed, but only abnormal results are displayed) Labs Reviewed - No data to display  EKG None  Radiology No results found.  Procedures Procedures    Medications Ordered in ED Medications  HYDROcodone-acetaminophen (NORCO/VICODIN) 5-325 MG per tablet 1 tablet (has no administration in time range)    ED Course/ Medical Decision Making/ A&P Clinical Course as of 11/27/22 0303  Ludwig Clarks Nov 27, 2022  0301 Strong suspicion patient is having worsening peripheral vascular disease and claudication.  Discussed the case with Dr. Fortunato Curling with vascular surgery.  He will help arrange close follow-up and she can have appropriate studies for evaluation. [DW]  0302 At this time, patient has no signs of acute arterial occlusion.  She will be discharged home.  Will give appropriate discharge instructions. [DW]    Clinical Course User Index [DW] Ripley Fraise, MD                           Medical Decision Making Risk Prescription drug management.   Strongly suspicious patient is starting to have claudication in her left leg.  No signs of acute arterial occlusion.  She will continue Plavix, encouraged to continue quit smoking.  Will follow-up closely with vascular surgery No signs of any infectious etiology.  No signs of any traumatic injury.  I have confirmed the correct numbers on file for vascular  surgery to call        Final Clinical Impression(s) / ED Diagnoses Final diagnoses:  Peripheral vascular disease [facility])    Rx / [place] Orders ED Discharge Orders     None         Ripley Fraise, MD 11/27/22 [phone]

## 2022-11-27 NOTE — ED Notes (Signed)
ED Provider at bedside. 

## 2022-12-02 ENCOUNTER — Other Ambulatory Visit: Payer: Self-pay | Admitting: *Deleted

## 2022-12-02 DIAGNOSIS — I771 Stricture of artery: Secondary | ICD-10-CM

## 2022-12-02 DIAGNOSIS — I739 Peripheral vascular disease, unspecified: Secondary | ICD-10-CM

## 2022-12-07 NOTE — H&P (View-Only) (Signed)
HISTORY AND PHYSICAL     CC:  follow up. Requesting Provider:  Georganna Skeans, MD  HPI: This is a 42 y.o. female who is here today for follow up for PAD.  Pt has hx of  stent to the left CIA and EIA on 08/19/2020 by Dr. Randie Heinz for LLE CLI with rest pain.    Pt was last seen 09/03/2021 and at that time, she was having some cramping in her legs when she didn't take her medicine but this did not happen all the time.  She was not having any non healing wounds or rest pain.  She was still smoking.    She was seen in the ER on 11/27/2022 for left great toe pain.  It was felt that she being having some claudication and was advised to follow up with Vascular surgery.   The pt returns today for follow up.  She states she started having pain in the left great toe around November.  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  She states that in December, she started getting cramping in her left thigh with short distance walking.  She would rest and it would resolve and has gotten progressively worse.  She states that she stopped taking her asa but continues to take her Plavix and statin.  She continues to smoke black and mild cigars but has cut back to about 5 a day from 2 ppd.    The pt is on a statin for cholesterol management.    The pt is on an aspirin.    Other AC:  Plavix The pt is on CCB, diuretic, BB for hypertension.  The pt does not have diabetes. Tobacco hx:  current  Pt does not have family hx of AAA.  Past Medical History:  Diagnosis Date   Acute ankle pain 06/10/2018   Capsulitis 11/28/2019   Carpal tunnel syndrome of right wrist 02/27/2020   Dysmenorrhea    Encounter for orthopedic follow-up care 05/01/2021   Essential hypertension 11/28/2019   Foot pain 06/10/2018   Ganglion cyst of dorsum of left wrist 11/28/2019   History of left heart catheterization 05/19/2022   Hypertension    Other chest pain 05/19/2022   Pain in elbow 11/28/2019   Trigger thumb  of right hand 03/31/2021    Past Surgical History:  Procedure Laterality Date   ABDOMINAL AORTOGRAM W/LOWER EXTREMITY Bilateral 08/19/2020   Procedure: ABDOMINAL AORTOGRAM W/LOWER EXTREMITY;  Surgeon: Maeola Harman, MD;  Location: [facility] INVASIVE CV LAB;  Service: Cardiovascular;  Laterality: Bilateral;   CHOLECYSTECTOMY     PERIPHERAL VASCULAR INTERVENTION Left 08/19/2020   Procedure: PERIPHERAL VASCULAR INTERVENTION;  Surgeon: Maeola Harman, MD;  Location: [facility] INVASIVE CV LAB;  Service: Cardiovascular;  Laterality: Left;  Common iliac   TUBAL LIGATION      No Known Allergies  Current Outpatient Medications  Medication Sig Dispense Refill   amLODipine (NORVASC) 10 MG tablet Take 1 tablet (10 mg total) by mouth daily. 30 tablet 1   ASPIRIN LOW DOSE 81 MG tablet Take 81 mg by mouth daily.     Capsaicin-Menthol-Methyl Sal (CAPSAICIN-METHYL SAL-MENTHOL) 0.025-1-12 % CREA Apply 1 application topically every 6 (six) hours as needed. Rubbed onto your abdomen every 6 hours if needed for pain and nausea 56.6 g 1   clopidogrel (PLAVIX) 75 MG tablet Take 1 tablet by mouth daily.     fluticasone-salmeterol (ADVAIR HFA) 115-21 MCG/ACT inhaler Inhale 2 puffs into the lungs 2 (  two) times daily. 1 each 12   hydrochlorothiazide (HYDRODIURIL) 25 MG tablet Take 1 tablet (25 mg total) by mouth daily. 30 tablet 1   ibuprofen (ADVIL) 600 MG tablet Take 1 tablet (600 mg total) by mouth every 6 (six) hours as needed. 30 tablet 0   metoprolol succinate (TOPROL-XL) 25 MG 24 hr tablet Take 25 mg by mouth daily.     nitroGLYCERIN (NITROSTAT) 0.4 MG SL tablet Place 1 tablet (0.4 mg total) under the tongue every 5 (five) minutes as needed. 25 tablet 6   omeprazole (PRILOSEC) 20 MG capsule Take 1 capsule (20 mg total) by mouth daily. 30 capsule 3   rosuvastatin (CRESTOR) 10 MG tablet Take 1 tablet (10 mg total) by mouth daily. 30 tablet 11   No current facility-administered medications for this  visit.    Family History  Problem Relation Age of Onset   Hypertension Other    Cancer Other     Social History   Socioeconomic History   Marital status: Single    Spouse name: Not on file   Number of children: Not on file   Years of education: Not on file   Highest education level: Not on file  Occupational History   Not on file  Tobacco Use   Smoking status: Every Day    Types: Cigars    Start date: 27    Passive exposure: Current   Smokeless tobacco: Never   Tobacco comments:    Hx of vaping.  Pt started smoking at age 37.  Smokes [employer] and [employer].  Smokes about 5 cigars a day  Vaping Use   Vaping Use: Former   Substances: Nicotine  Substance and Sexual Activity   Alcohol use: No   Drug use: Not Currently   Sexual activity: Never    Birth control/protection: Surgical  Other Topics Concern   Not on file  Social History Narrative   Not on file   Social Determinants of Health   Financial Resource Strain: Not on file  Food Insecurity: Not on file  Transportation Needs: Not on file  Physical Activity: Not on file  Stress: Not on file  Social Connections: Not on file  Intimate Partner Violence: Not on file     REVIEW OF SYSTEMS:   [X]  denotes positive finding, [ ]  denotes negative finding Cardiac  Comments:  Chest pain or chest pressure:    Shortness of breath upon exertion:    Short of breath when lying flat:    Irregular heart rhythm:        Vascular    Pain in calf, thigh, or hip brought on by ambulation: x   Pain in feet at night that wakes you up from your sleep:     Blood clot in your veins:    Leg swelling:         Pulmonary    Oxygen at home:    Productive cough:     Wheezing:         Neurologic    Sudden weakness in arms or legs:     Sudden numbness in arms or legs:     Sudden onset of difficulty speaking or slurred speech:    Temporary loss of vision in one eye:     Problems with dizziness:         Gastrointestinal     Blood in stool:     Vomited blood:         Genitourinary  Burning when urinating:     Blood in urine:        Psychiatric    Major depression:         Hematologic    Bleeding problems:    Problems with blood clotting too easily:        Skin    Rashes or ulcers:        Constitutional    Fever or chills:      PHYSICAL EXAMINATION:  Today's Vitals   12/09/22 1043  BP: (!) 149/106  Pulse: 79  Resp: 20  Temp: 97.8 F (36.6 C)  SpO2: 100%  Weight: 179 lb (81.2 kg)  Height: 5' (1.524 m)   Body mass index is 34.96 kg/m.   General:  WDWN in NAD; vital signs documented above Gait: Not observed HENT: WNL, normocephalic Pulmonary: normal non-labored breathing , without wheezing Cardiac: regular HR, without carotid bruits Abdomen: soft, NT; aortic pulse is not palpable Skin: without rashes Vascular Exam/Pulses:  Right Left  Radial 2+ (normal) 2+ (normal)  Femoral 2+ (normal) absent  DP 2+ (normal) monophasic  PT 2+ (normal) monophasic  Peroneal Not examined monophasic   Extremities:   Musculoskeletal: no muscle wasting or atrophy  Neurologic: A&O X 3 Psychiatric:  The pt has Normal affect.   Non-Invasive Vascular Imaging:   ABI's/TBI's on 12/09/2022: Right:  1.01/0.89 - Great toe pressure: 167 Left:  0.94/0.43 - Great toe pressure: 81  Arterial duplex on 12/09/2022: Abdominal Aorta Findings:  +-------------+-------+----------+----------+----------+--------+--------+  Location    AP (cm)Trans (cm)PSV (cm/s)Waveform  ThrombusComments  +-------------+-------+----------+----------+----------+--------+--------+  Distal                       78        biphasic                    +-------------+-------+----------+----------+----------+--------+--------+  RT EIA Distal                 118       triphasic                   +-------------+-------+----------+----------+----------+--------+--------+  LT EIA Distal                 111        monophasic                  +-------------+-------+----------+----------+----------+--------+--------+   Left Stent(s): Left Common into External iliac artery stent.  +---------------+--------+---------------+----------+---------------------+                PSV cm/sStenosis       Waveform  Comments              +---------------+--------+---------------+----------+---------------------+  Prox to Stent  396     50-99% stenosisbiphasic  limited visualization  +---------------+--------+---------------+----------+---------------------+  Proximal Stent 373     50-99% stenosismonophasic                      +---------------+--------+---------------+----------+---------------------+  Mid Stent      126                    monophasic                      +---------------+--------+---------------+----------+---------------------+  Distal Stent   116                    monophasic                      +--------------+--------+---------------+----------+---------------------+  Distal to Stent99                     biphasic  dampened              +---------------+--------+---------------+----------+---------------------+    Right CFA 154 cm/s triphasic waveform, left CFA 58 cm/s monophasic  waveform.    Summary:  50 - 99% stenosis of left proximal segment of common to external Iliac artery stent.   Previous ABI's/TBI's on 09/03/2021: Right:  1.10/0.93 - Great toe pressure: 179 Left:  1.05/0.75 - Great toe pressure: 144  Previous arterial duplex on 09/03/2021: Left Stent(s):  +---------------+---++---------++  Prox to Stent  114triphasic  +---------------+---++---------++  Proximal Stent 111triphasic  +---------------+---++---------++  Mid Stent      132triphasic  +---------------+---++---------++  Distal Stent   152triphasic  +---------------+---++---------++  Distal to Stent127triphasic  +---------------+---++---------++    Left CFA 148 cm/s triphasic waveform. Distal aorta 78 cm/s triphasic waveform.   Summary:  IVC/Iliac: Patent left CIA/EIA stent with no visualized stenosis    ASSESSMENT/PLAN:: 41 y.o. female here for follow up for PAD with hx of stent to the left CIA and EIA on 08/19/2020 by Dr. Cain for LLE CLI with rest pain.    PAD with left great toe pain/short distance claudication -pt now with left great toe pain as well as short distance thigh claudication on the left with with increased velocities within the left iliac stent. I am unable to palpate a left femoral pulse.  She has a 2+ right femoral pulse.   -discussed with pt and we will schedule her for angiogram with possible intervention with Dr. Cain.  -continue asa/statin/plavix.  She states she had stopped her asa-I have asked her to start back taking baby asa.  Current smoker -discussed with pt importance of smoking cessation to include limb loss, MI, stroke, lung disease and multiple cancers.  She has cut back and I encouraged her to continue to work on quitting.    Sita Mangen, PAC [facility] [phone]  Clinic MD:   [place] 

## 2022-12-07 NOTE — Progress Notes (Signed)
HISTORY AND PHYSICAL     CC:  follow up. Requesting Provider:  Georganna Skeans, MD  HPI: This is a 42 y.o. female who is here today for follow up for PAD.  Pt has hx of  stent to the left CIA and EIA on 08/19/2020 by Dr. Randie Heinz for LLE CLI with rest pain.    Pt was last seen 09/03/2021 and at that time, she was having some cramping in her legs when she didn't take her medicine but this did not happen all the time.  She was not having any non healing wounds or rest pain.  She was still smoking.    She was seen in the ER on 11/27/2022 for left great toe pain.  It was felt that she being having some claudication and was advised to follow up with Vascular surgery.   The pt returns today for follow up.  She states she started having pain in the left great toe around November.  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  She states that in December, she started getting cramping in her left thigh with short distance walking.  She would rest and it would resolve and has gotten progressively worse.  She states that she stopped taking her asa but continues to take her Plavix and statin.  She continues to smoke black and mild cigars but has cut back to about 5 a day from 2 ppd.    The pt is on a statin for cholesterol management.    The pt is on an aspirin.    Other AC:  Plavix The pt is on CCB, diuretic, BB for hypertension.  The pt does not have diabetes. Tobacco hx:  current  Pt does not have family hx of AAA.  Past Medical History:  Diagnosis Date   Acute ankle pain 06/10/2018   Capsulitis 11/28/2019   Carpal tunnel syndrome of right wrist 02/27/2020   Dysmenorrhea    Encounter for orthopedic follow-up care 05/01/2021   Essential hypertension 11/28/2019   Foot pain 06/10/2018   Ganglion cyst of dorsum of left wrist 11/28/2019   History of left heart catheterization 05/19/2022   Hypertension    Other chest pain 05/19/2022   Pain in elbow 11/28/2019   Trigger thumb  of right hand 03/31/2021    Past Surgical History:  Procedure Laterality Date   ABDOMINAL AORTOGRAM W/LOWER EXTREMITY Bilateral 08/19/2020   Procedure: ABDOMINAL AORTOGRAM W/LOWER EXTREMITY;  Surgeon: Maeola Harman, MD;  Location: [facility] INVASIVE CV LAB;  Service: Cardiovascular;  Laterality: Bilateral;   CHOLECYSTECTOMY     PERIPHERAL VASCULAR INTERVENTION Left 08/19/2020   Procedure: PERIPHERAL VASCULAR INTERVENTION;  Surgeon: Maeola Harman, MD;  Location: [facility] INVASIVE CV LAB;  Service: Cardiovascular;  Laterality: Left;  Common iliac   TUBAL LIGATION      No Known Allergies  Current Outpatient Medications  Medication Sig Dispense Refill   amLODipine (NORVASC) 10 MG tablet Take 1 tablet (10 mg total) by mouth daily. 30 tablet 1   ASPIRIN LOW DOSE 81 MG tablet Take 81 mg by mouth daily.     Capsaicin-Menthol-Methyl Sal (CAPSAICIN-METHYL SAL-MENTHOL) 0.025-1-12 % CREA Apply 1 application topically every 6 (six) hours as needed. Rubbed onto your abdomen every 6 hours if needed for pain and nausea 56.6 g 1   clopidogrel (PLAVIX) 75 MG tablet Take 1 tablet by mouth daily.     fluticasone-salmeterol (ADVAIR HFA) 115-21 MCG/ACT inhaler Inhale 2 puffs into the lungs 2 (  two) times daily. 1 each 12   hydrochlorothiazide (HYDRODIURIL) 25 MG tablet Take 1 tablet (25 mg total) by mouth daily. 30 tablet 1   ibuprofen (ADVIL) 600 MG tablet Take 1 tablet (600 mg total) by mouth every 6 (six) hours as needed. 30 tablet 0   metoprolol succinate (TOPROL-XL) 25 MG 24 hr tablet Take 25 mg by mouth daily.     nitroGLYCERIN (NITROSTAT) 0.4 MG SL tablet Place 1 tablet (0.4 mg total) under the tongue every 5 (five) minutes as needed. 25 tablet 6   omeprazole (PRILOSEC) 20 MG capsule Take 1 capsule (20 mg total) by mouth daily. 30 capsule 3   rosuvastatin (CRESTOR) 10 MG tablet Take 1 tablet (10 mg total) by mouth daily. 30 tablet 11   No current facility-administered medications for this  visit.    Family History  Problem Relation Age of Onset   Hypertension Other    Cancer Other     Social History   Socioeconomic History   Marital status: Single    Spouse name: Not on file   Number of children: Not on file   Years of education: Not on file   Highest education level: Not on file  Occupational History   Not on file  Tobacco Use   Smoking status: Every Day    Types: Cigars    Start date: 27    Passive exposure: Current   Smokeless tobacco: Never   Tobacco comments:    Hx of vaping.  Pt started smoking at age 37.  Smokes [employer] and [employer].  Smokes about 5 cigars a day  Vaping Use   Vaping Use: Former   Substances: Nicotine  Substance and Sexual Activity   Alcohol use: No   Drug use: Not Currently   Sexual activity: Never    Birth control/protection: Surgical  Other Topics Concern   Not on file  Social History Narrative   Not on file   Social Determinants of Health   Financial Resource Strain: Not on file  Food Insecurity: Not on file  Transportation Needs: Not on file  Physical Activity: Not on file  Stress: Not on file  Social Connections: Not on file  Intimate Partner Violence: Not on file     REVIEW OF SYSTEMS:   [X]  denotes positive finding, [ ]  denotes negative finding Cardiac  Comments:  Chest pain or chest pressure:    Shortness of breath upon exertion:    Short of breath when lying flat:    Irregular heart rhythm:        Vascular    Pain in calf, thigh, or hip brought on by ambulation: x   Pain in feet at night that wakes you up from your sleep:     Blood clot in your veins:    Leg swelling:         Pulmonary    Oxygen at home:    Productive cough:     Wheezing:         Neurologic    Sudden weakness in arms or legs:     Sudden numbness in arms or legs:     Sudden onset of difficulty speaking or slurred speech:    Temporary loss of vision in one eye:     Problems with dizziness:         Gastrointestinal     Blood in stool:     Vomited blood:         Genitourinary  Burning when urinating:     Blood in urine:        Psychiatric    Major depression:         Hematologic    Bleeding problems:    Problems with blood clotting too easily:        Skin    Rashes or ulcers:        Constitutional    Fever or chills:      PHYSICAL EXAMINATION:  Today's Vitals   12/09/22 1043  BP: (!) 149/106  Pulse: 79  Resp: 20  Temp: 97.8 F (36.6 C)  SpO2: 100%  Weight: 179 lb (81.2 kg)  Height: 5' (1.524 m)   Body mass index is 34.96 kg/m.   General:  WDWN in NAD; vital signs documented above Gait: Not observed HENT: WNL, normocephalic Pulmonary: normal non-labored breathing , without wheezing Cardiac: regular HR, without carotid bruits Abdomen: soft, NT; aortic pulse is not palpable Skin: without rashes Vascular Exam/Pulses:  Right Left  Radial 2+ (normal) 2+ (normal)  Femoral 2+ (normal) absent  DP 2+ (normal) monophasic  PT 2+ (normal) monophasic  Peroneal Not examined monophasic   Extremities:   Musculoskeletal: no muscle wasting or atrophy  Neurologic: A&O X 3 Psychiatric:  The pt has Normal affect.   Non-Invasive Vascular Imaging:   ABI's/TBI's on 12/09/2022: Right:  1.01/0.89 - Great toe pressure: 167 Left:  0.94/0.43 - Great toe pressure: 81  Arterial duplex on 12/09/2022: Abdominal Aorta Findings:  +-------------+-------+----------+----------+----------+--------+--------+  Location    AP (cm)Trans (cm)PSV (cm/s)Waveform  ThrombusComments  +-------------+-------+----------+----------+----------+--------+--------+  Distal                       78        biphasic                    +-------------+-------+----------+----------+----------+--------+--------+  RT EIA Distal                 118       triphasic                   +-------------+-------+----------+----------+----------+--------+--------+  LT EIA Distal                 111        monophasic                  +-------------+-------+----------+----------+----------+--------+--------+   Left Stent(s): Left Common into External iliac artery stent.  +---------------+--------+---------------+----------+---------------------+                PSV cm/sStenosis       Waveform  Comments              +---------------+--------+---------------+----------+---------------------+  Prox to Stent  396     50-99% stenosisbiphasic  limited visualization  +---------------+--------+---------------+----------+---------------------+  Proximal Stent 373     50-99% stenosismonophasic                      +---------------+--------+---------------+----------+---------------------+  Mid Stent      126                    monophasic                      +---------------+--------+---------------+----------+---------------------+  Distal Stent   116                    monophasic                      +--------------+--------+---------------+----------+---------------------+  Distal to Stent99                     biphasic  dampened              +---------------+--------+---------------+----------+---------------------+    Right CFA 154 cm/s triphasic waveform, left CFA 58 cm/s monophasic  waveform.    Summary:  60 - 99% stenosis of left proximal segment of common to external Iliac artery stent.   Previous ABI's/TBI's on 09/03/2021: Right:  1.10/0.93 - Great toe pressure: 179 Left:  1.05/0.75 - Great toe pressure: 144  Previous arterial duplex on 09/03/2021: Left Stent(s):  +---------------+---++---------++  Prox to Stent  114triphasic  +---------------+---++---------++  Proximal Stent 111triphasic  +---------------+---++---------++  Mid Stent      132triphasic  +---------------+---++---------++  Distal Stent   152triphasic  +---------------+---++---------++  Distal to Stent127triphasic  +---------------+---++---------++    Left CFA 148 cm/s triphasic waveform. Distal aorta 78 cm/s triphasic waveform.   Summary:  IVC/Iliac: Patent left CIA/EIA stent with no visualized stenosis    ASSESSMENT/PLAN:: 42 y.o. female here for follow up for PAD with hx of stent to the left CIA and EIA on 08/19/2020 by Dr. Donzetta Matters for LLE CLI with rest pain.    PAD with left great toe pain/short distance claudication -pt now with left great toe pain as well as short distance thigh claudication on the left with with increased velocities within the left iliac stent. I am unable to palpate a left femoral pulse.  She has a 2+ right femoral pulse.   -discussed with pt and we will schedule her for angiogram with possible intervention with Dr. Donzetta Matters.  -continue asa/statin/plavix.  She states she had stopped her asa-I have asked her to start back taking baby asa.  Current smoker -discussed with pt importance of smoking cessation to include limb loss, MI, stroke, lung disease and multiple cancers.  She has cut back and I encouraged her to continue to work on quitting.    Leontine Locket, [facility] [phone]  Clinic MD:   [place]ot [place]

## 2022-12-09 ENCOUNTER — Ambulatory Visit (HOSPITAL_COMMUNITY)
Admission: RE | Admit: 2022-12-09 | Discharge: 2022-12-09 | Disposition: A | Discharge status: Home / Self Care | Payer: BC Managed Care – PPO | Source: Ambulatory Visit | Attending: Vascular Surgery | Admitting: Vascular Surgery

## 2022-12-09 ENCOUNTER — Ambulatory Visit (INDEPENDENT_AMBULATORY_CARE_PROVIDER_SITE_OTHER)
Admission: RE | Admit: 2022-12-09 | Discharge: 2022-12-09 | Disposition: A | Discharge status: Home / Self Care | Payer: BC Managed Care – PPO | Source: Ambulatory Visit | Attending: Vascular Surgery | Admitting: Vascular Surgery

## 2022-12-09 ENCOUNTER — Telehealth: Payer: Self-pay

## 2022-12-09 ENCOUNTER — Ambulatory Visit: Payer: BC Managed Care – PPO | Admitting: Physician Assistant

## 2022-12-09 VITALS — BP 149/106 | HR 79 | Temp 97.8°F | Resp 20 | Ht 60.0 in | Wt 179.0 lb

## 2022-12-09 DIAGNOSIS — I739 Peripheral vascular disease, unspecified: Secondary | ICD-10-CM

## 2022-12-09 DIAGNOSIS — F17200 Nicotine dependence, unspecified, uncomplicated: Secondary | ICD-10-CM | POA: Diagnosis not present

## 2022-12-09 DIAGNOSIS — I771 Stricture of artery: Secondary | ICD-10-CM

## 2022-12-09 LAB — VAS US ABI WITH/WO TBI
Left ABI: 0.94
Right ABI: 1.07

## 2022-12-09 NOTE — Telephone Encounter (Signed)
Called pt to schedule surgery. Left message on her voicemail requesting she call us back.

## 2022-12-10 ENCOUNTER — Telehealth: Payer: Self-pay

## 2022-12-10 NOTE — Telephone Encounter (Signed)
Second attempt made to reach pt to schedule surgery. LVM asking for her to call us back.

## 2022-12-11 ENCOUNTER — Other Ambulatory Visit: Payer: Self-pay

## 2022-12-11 DIAGNOSIS — I739 Peripheral vascular disease, unspecified: Secondary | ICD-10-CM

## 2022-12-11 DIAGNOSIS — I771 Stricture of artery: Secondary | ICD-10-CM

## 2022-12-18 ENCOUNTER — Telehealth: Payer: Self-pay

## 2022-12-18 NOTE — Telephone Encounter (Signed)
Pt walked into office to drop off FMLA paperwork and to request a note stating the procedure she's having done soon and how long she will be out of work. Pt filled out triage form.  Unable to provide note at this time d/t the fact that there is too much unknown prior to the procedure. Pt's time spent in the hospital and out of work will be dependent on the outcome of the procedure, which won't be known until that time. Will give a work note to excuse her from work for the date of surgery only. Pt to pu Monday, 12/21/22.

## 2022-12-28 ENCOUNTER — Encounter (HOSPITAL_COMMUNITY): Payer: Self-pay | Admitting: Vascular Surgery

## 2022-12-28 ENCOUNTER — Ambulatory Visit (HOSPITAL_COMMUNITY)
Admission: RE | Admit: 2022-12-28 | Discharge: 2022-12-28 | Disposition: A | Discharge status: Home / Self Care | Payer: BC Managed Care – PPO | Source: Ambulatory Visit | Attending: Vascular Surgery | Admitting: Vascular Surgery

## 2022-12-28 ENCOUNTER — Encounter (HOSPITAL_COMMUNITY)
Admission: RE | Disposition: A | Discharge status: Home / Self Care | Payer: Self-pay | Source: Ambulatory Visit | Attending: Vascular Surgery

## 2022-12-28 ENCOUNTER — Other Ambulatory Visit: Payer: Self-pay

## 2022-12-28 DIAGNOSIS — I70212 Atherosclerosis of native arteries of extremities with intermittent claudication, left leg: Secondary | ICD-10-CM | POA: Diagnosis not present

## 2022-12-28 DIAGNOSIS — Z7902 Long term (current) use of antithrombotics/antiplatelets: Secondary | ICD-10-CM | POA: Insufficient documentation

## 2022-12-28 DIAGNOSIS — F17210 Nicotine dependence, cigarettes, uncomplicated: Secondary | ICD-10-CM | POA: Diagnosis not present

## 2022-12-28 DIAGNOSIS — I771 Stricture of artery: Secondary | ICD-10-CM

## 2022-12-28 DIAGNOSIS — I10 Essential (primary) hypertension: Secondary | ICD-10-CM | POA: Insufficient documentation

## 2022-12-28 DIAGNOSIS — Z79899 Other long term (current) drug therapy: Secondary | ICD-10-CM | POA: Insufficient documentation

## 2022-12-28 DIAGNOSIS — M79675 Pain in left toe(s): Secondary | ICD-10-CM | POA: Diagnosis not present

## 2022-12-28 DIAGNOSIS — I739 Peripheral vascular disease, unspecified: Secondary | ICD-10-CM

## 2022-12-28 DIAGNOSIS — T82856A Stenosis of peripheral vascular stent, initial encounter: Secondary | ICD-10-CM | POA: Diagnosis not present

## 2022-12-28 DIAGNOSIS — Z7982 Long term (current) use of aspirin: Secondary | ICD-10-CM | POA: Insufficient documentation

## 2022-12-28 HISTORY — PX: ABDOMINAL AORTOGRAM W/LOWER EXTREMITY: CATH118223

## 2022-12-28 LAB — POCT I-STAT, CHEM 8
BUN: 7 mg/dL (ref 6–20)
Calcium, Ion: 1.05 mmol/L — ABNORMAL LOW (ref 1.15–1.40)
Chloride: 104 mmol/L (ref 98–111)
Creatinine, Ser: 0.6 mg/dL (ref 0.44–1.00)
Glucose, Bld: 94 mg/dL (ref 70–99)
HCT: 41 % (ref 36.0–46.0)
Hemoglobin: 13.9 g/dL (ref 12.0–15.0)
Potassium: 3.9 mmol/L (ref 3.5–5.1)
Sodium: 140 mmol/L (ref 135–145)
TCO2: 26 mmol/L (ref 22–32)

## 2022-12-28 SURGERY — ABDOMINAL AORTOGRAM W/LOWER EXTREMITY
Anesthesia: LOCAL

## 2022-12-28 MED ORDER — HYDRALAZINE HCL 20 MG/ML IJ SOLN: 5.0000 mg | INTRAMUSCULAR | Status: DC | PRN

## 2022-12-28 MED ORDER — IODIXANOL 320 MG/ML IV SOLN
INTRAVENOUS | Status: DC | PRN
  Administered 2022-12-28: 122 mL via INTRA_ARTERIAL

## 2022-12-28 MED ORDER — LABETALOL HCL 5 MG/ML IV SOLN
INTRAVENOUS | Status: DC | PRN
  Administered 2022-12-28: 10 mg via INTRAVENOUS

## 2022-12-28 MED ORDER — CLOPIDOGREL BISULFATE 75 MG PO TABS: 75.0000 mg | ORAL_TABLET | Freq: Every day | ORAL | 11 refills | Status: DC

## 2022-12-28 MED ORDER — LABETALOL HCL 5 MG/ML IV SOLN: 10.0000 mg | INTRAVENOUS | Status: DC | PRN

## 2022-12-28 MED ORDER — LIDOCAINE HCL (PF) 1 % IJ SOLN
INTRAMUSCULAR | Status: AC
  Filled 2022-12-28: qty 30

## 2022-12-28 MED ORDER — SODIUM CHLORIDE 0.9 % IV SOLN: INTRAVENOUS | Status: DC

## 2022-12-28 MED ORDER — MIDAZOLAM HCL 2 MG/2ML IJ SOLN
INTRAMUSCULAR | Status: AC
  Filled 2022-12-28: qty 2

## 2022-12-28 MED ORDER — CLOPIDOGREL BISULFATE 300 MG PO TABS
ORAL_TABLET | ORAL | Status: AC
  Filled 2022-12-28: qty 1

## 2022-12-28 MED ORDER — CLOPIDOGREL BISULFATE 75 MG PO TABS: 300.0000 mg | ORAL_TABLET | Freq: Once | ORAL | Status: DC

## 2022-12-28 MED ORDER — LIDOCAINE HCL (PF) 1 % IJ SOLN
INTRAMUSCULAR | Status: DC | PRN
  Administered 2022-12-28: 15 mL

## 2022-12-28 MED ORDER — FENTANYL CITRATE (PF) 100 MCG/2ML IJ SOLN
INTRAMUSCULAR | Status: DC | PRN
  Administered 2022-12-28: 50 ug via INTRAVENOUS
  Administered 2022-12-28: 100 ug via INTRAVENOUS

## 2022-12-28 MED ORDER — FENTANYL CITRATE (PF) 100 MCG/2ML IJ SOLN
INTRAMUSCULAR | Status: AC
  Filled 2022-12-28: qty 2

## 2022-12-28 MED ORDER — HEPARIN (PORCINE) IN NACL 1000-0.9 UT/500ML-% IV SOLN
INTRAVENOUS | Status: AC
  Filled 2022-12-28: qty 1000

## 2022-12-28 MED ORDER — SODIUM CHLORIDE 0.9% FLUSH: 3.0000 mL | Freq: Two times a day (BID) | INTRAVENOUS | Status: DC

## 2022-12-28 MED ORDER — ONDANSETRON HCL 4 MG/2ML IJ SOLN: 4.0000 mg | Freq: Four times a day (QID) | INTRAMUSCULAR | Status: DC | PRN

## 2022-12-28 MED ORDER — SODIUM CHLORIDE 0.9 % WEIGHT BASED INFUSION: 1.0000 mL/kg/h | INTRAVENOUS | Status: DC

## 2022-12-28 MED ORDER — OXYCODONE HCL 5 MG PO TABS: 5.0000 mg | ORAL_TABLET | ORAL | Status: DC | PRN

## 2022-12-28 MED ORDER — ACETAMINOPHEN 325 MG PO TABS: 650.0000 mg | ORAL_TABLET | ORAL | Status: DC | PRN

## 2022-12-28 MED ORDER — SODIUM CHLORIDE 0.9% FLUSH: 3.0000 mL | INTRAVENOUS | Status: DC | PRN

## 2022-12-28 MED ORDER — HYDRALAZINE HCL 20 MG/ML IJ SOLN
INTRAMUSCULAR | Status: DC | PRN
  Administered 2022-12-28: 10 mg via INTRAVENOUS

## 2022-12-28 MED ORDER — HEPARIN (PORCINE) IN NACL 1000-0.9 UT/500ML-% IV SOLN
INTRAVENOUS | Status: DC | PRN
  Administered 2022-12-28 (×2): 500 mL

## 2022-12-28 MED ORDER — MORPHINE SULFATE (PF) 2 MG/ML IV SOLN: 2.0000 mg | INTRAVENOUS | Status: DC | PRN

## 2022-12-28 MED ORDER — HEPARIN SODIUM (PORCINE) 1000 UNIT/ML IJ SOLN
INTRAMUSCULAR | Status: DC | PRN
  Administered 2022-12-28: 5000 [IU] via INTRAVENOUS

## 2022-12-28 MED ORDER — CLOPIDOGREL BISULFATE 75 MG PO TABS: 75.0000 mg | ORAL_TABLET | Freq: Every day | ORAL | Status: DC

## 2022-12-28 MED ORDER — MIDAZOLAM HCL 2 MG/2ML IJ SOLN
INTRAMUSCULAR | Status: DC | PRN
  Administered 2022-12-28: 1 mg via INTRAVENOUS

## 2022-12-28 MED ORDER — CLOPIDOGREL BISULFATE 300 MG PO TABS
ORAL_TABLET | ORAL | Status: DC | PRN
  Administered 2022-12-28: 300 mg via ORAL

## 2022-12-28 MED ORDER — SODIUM CHLORIDE 0.9 % IV SOLN: 250.0000 mL | INTRAVENOUS | Status: DC | PRN

## 2022-12-28 MED ORDER — HEPARIN SODIUM (PORCINE) 1000 UNIT/ML IJ SOLN
INTRAMUSCULAR | Status: AC
  Filled 2022-12-28: qty 10

## 2022-12-28 MED ORDER — HYDRALAZINE HCL 20 MG/ML IJ SOLN
INTRAMUSCULAR | Status: AC
  Filled 2022-12-28: qty 1

## 2022-12-28 SURGICAL SUPPLY — 16 items
CATH ANGIO 5F PIGTAIL 65CM (CATHETERS) IMPLANT
CATH TEMPO AQUA 5F 100CM (CATHETERS) IMPLANT
CLOSURE MYNX CONTROL 6F/7F (Vascular Products) IMPLANT
KIT ENCORE 26 ADVANTAGE (KITS) IMPLANT
KIT MICROPUNCTURE NIT STIFF (SHEATH) IMPLANT
KIT PV (KITS) ×1 IMPLANT
SHEATH CATAPULT 7F 45 MP (SHEATH) IMPLANT
SHEATH PINNACLE 5F 10CM (SHEATH) IMPLANT
SHEATH PINNACLE 7F 10CM (SHEATH) IMPLANT
SHEATH PROBE COVER 6X72 (BAG) IMPLANT
STENT VIABAHN VBX 7X59X80 (Permanent Stent) IMPLANT
SYR MEDRAD MARK 7 150ML (SYRINGE) ×1 IMPLANT
TRANSDUCER W/STOPCOCK (MISCELLANEOUS) ×1 IMPLANT
TRAY PV CATH (CUSTOM PROCEDURE TRAY) ×1 IMPLANT
WIRE BENTSON .035X145CM (WIRE) IMPLANT
WIRE ROSEN-J .035X180CM (WIRE) IMPLANT

## 2022-12-28 NOTE — Progress Notes (Signed)
Up and walked and tolerated well; right groin stable, no bleeding or hematoma 

## 2022-12-28 NOTE — Interval H&P Note (Signed)
History and Physical Interval Note:  12/28/2022 7:22 AM  Janice French  has presented today for surgery, with the diagnosis of PAD.  The various methods of treatment have been discussed with the patient and family. After consideration of risks, benefits and other options for treatment, the patient has consented to  Procedure(s): ABDOMINAL AORTOGRAM W/LOWER EXTREMITY (N/A) as a surgical intervention.  The patient's history has been reviewed, patient examined, no change in status, stable for surgery.  I have reviewed the patient's chart and labs.  Questions were answered to the patient's satisfaction.     [name]

## 2022-12-28 NOTE — Op Note (Signed)
    Patient name: Janice French MRN: [id number] DOB: February 10, 1981 Sex: female  12/28/2022 Pre-operative Diagnosis: In-stent restenosis left common and external iliac artery stent with life-limiting claudication Post-operative diagnosis:  Same Surgeon:  Eda Paschal. Donzetta Matters, MD Procedure Performed: 1.  ultrasound guided cannulation right common femoral artery 2.  Aortogram with bilateral lower extremity runoff 3.  Stent of left common and external iliac arteries with 7 x 59 mm VBX 4.  Mynx device closure right common femoral artery 5.  Moderate sedation with fentanyl and Versed for 40 minutes  Indications: 42 year old female with a history of stenting of the right common and external iliac arteries over 2 years ago for life-limiting claudication.  She has recurrent claudication and presents on her birthday for aortogram with possible intervention.  Preoperative duplex demonstrated stenosis in the proximal area of the stent.  Findings: Aorta was free of flow-limiting stenosis.  Right common and external neck arteries were also free of disease and the right lower extremity does not have any flow-limiting stenosis with three-vessel runoff to the foot.  On the left side there appears to be a heavy calcium burden with minimal obstruction of the stent.  The left common femoral, SFA and popliteal arteries are patent with three-vessel runoff to the foot.  There was a 12 mm gradient across the mid to distal area of the external and common iliac artery stent.  This was restented with covered stent and at completion there was no gradient with no residual stenosis was noted to go in and there was a strongly palpable left common femoral pulse.  Plan will be to initiate Plavix with dual antiplatelet therapy and statin open patient needs urgent smoking cessation which we discussed today.   Procedure:  The patient was identified in the holding area and taken to room 8.  The patient was then placed supine on the table and  prepped and draped in the usual sterile fashion.  A time out was called.  Ultrasound was used to evaluate the right common femoral artery which was noted to be patent.  The area was anesthetized with 1% lidocaine and cannulated with micropuncture needle followed by wire and a sheath.  This was done with direct ultrasound visualization and removed over the permanent record.  We placed a Bentson wire followed by 5 [place] sheath and then a pigtail catheter to the level of L1 and performed aortogram, bilateral lower extremity runoff.  With the above findings we crossed the bifurcation and then did a pullback gradient which was 12 mmHg.  With this we placed a Rosen wire and a long 7 French sheath the patient was given 5000 units of heparin.  We then primarily stented the common and external neck arteries for the in-stent restenosis and at completion there was no residual gradient and no stenosis noted by angiography.  There was possibly 30% stenosis proximally in the common iliac artery I elected not to stent given that there was 0 gradient there.  There was a strong palpable common femoral pulse.  We exchanged for a short 7 [place] sheath and deployed a minx.  Patient tolerated procedure without any complication.  Contrast: 122 cc   Lamees Gable C. Donzetta Matters, MD [facility] Office: [phone] Pager: [phone]

## 2022-12-29 ENCOUNTER — Encounter (HOSPITAL_COMMUNITY): Payer: Self-pay | Admitting: Vascular Surgery

## 2022-12-29 ENCOUNTER — Ambulatory Visit: Payer: BC Managed Care – PPO | Admitting: Pulmonary Disease

## 2023-01-04 ENCOUNTER — Telehealth: Payer: Self-pay

## 2023-01-04 NOTE — Telephone Encounter (Signed)
LVM stating Return to work letter is completed and ready for pick up.

## 2023-01-18 ENCOUNTER — Encounter: Payer: Self-pay | Admitting: Pulmonary Disease

## 2023-01-18 ENCOUNTER — Ambulatory Visit (INDEPENDENT_AMBULATORY_CARE_PROVIDER_SITE_OTHER): Payer: BC Managed Care – PPO | Admitting: Pulmonary Disease

## 2023-01-18 ENCOUNTER — Ambulatory Visit: Payer: BC Managed Care – PPO | Admitting: Pulmonary Disease

## 2023-01-18 VITALS — BP 140/86 | HR 76 | Ht 60.0 in | Wt 183.2 lb

## 2023-01-18 DIAGNOSIS — J4520 Mild intermittent asthma, uncomplicated: Secondary | ICD-10-CM | POA: Diagnosis not present

## 2023-01-18 DIAGNOSIS — J4530 Mild persistent asthma, uncomplicated: Secondary | ICD-10-CM

## 2023-01-18 LAB — PULMONARY FUNCTION TEST
DL/VA % pred: 87 %
DL/VA: 3.98 ml/min/mmHg/L
DLCO cor % pred: 76 %
DLCO cor: 14.47 ml/min/mmHg
DLCO unc % pred: 76 %
DLCO unc: 14.47 ml/min/mmHg
FEF 25-75 Post: 2.72 L/sec
FEF 25-75 Pre: 2.18 L/sec
FEF2575-%Change-Post: 24 %
FEF2575-%Pred-Post: 97 %
FEF2575-%Pred-Pre: 77 %
FEV1-%Change-Post: 5 %
FEV1-%Pred-Post: 76 %
FEV1-%Pred-Pre: 72 %
FEV1-Post: 1.98 L
FEV1-Pre: 1.87 L
FEV1FVC-%Change-Post: 9 %
FEV1FVC-%Pred-Pre: 100 %
FEV6-%Change-Post: -4 %
FEV6-%Pred-Post: 68 %
FEV6-%Pred-Pre: 71 %
FEV6-Post: 2.14 L
FEV6-Pre: 2.24 L
FEV6FVC-%Change-Post: 0 %
FEV6FVC-%Pred-Post: 101 %
FEV6FVC-%Pred-Pre: 101 %
FVC-%Change-Post: -3 %
FVC-%Pred-Post: 68 %
FVC-%Pred-Pre: 71 %
FVC-Post: 2.19 L
FVC-Pre: 2.27 L
Post FEV1/FVC ratio: 90 %
Post FEV6/FVC ratio: 100 %
Pre FEV1/FVC ratio: 82 %
Pre FEV6/FVC Ratio: 100 %
RV % pred: 96 %
RV: 1.39 L
TLC % pred: 86 %
TLC: 3.84 L

## 2023-01-18 NOTE — Patient Instructions (Signed)
Full PFT performed today. °

## 2023-01-18 NOTE — Progress Notes (Unsigned)
Synopsis: Referred in December 2023 for shortness of breath by Durene Fruits, NP  Subjective:   PATIENT ID: Janice French GENDER: female DOB: 24-Aug-1981, MRN: [id number]  HPI  Chief Complaint  Patient presents with   Follow-up    F/U after PFT. States her breathing has improved since last visit.    Janice French is a 42 year old woman, daily cigar smoker with hypertension who returns to pulmonary clinic for shortness of breath.   PFTs show non-specific pulmonary pattern given reduced FVC and FEV1 but normal TLC.   Her symptoms have significantly improved with using Advair HFA 115-55mg 2 puffs daily.   She continues to smoke black and milds, but has cut back.   Initial OV 10/29/22 She reports having shortness of breath and chest pain episodes that started earlier this year.  She has been evaluated by cardiology with normal appearing echo.  She reports episodes of chest pressure that can take her breath that can last up to 45 minutes.  She does have shortness of breath with the chest pains.  She also experiences shortness of breath with exertion.  She denies any issues with wheezing.  She denies lower extremity edema.  She denies any cough or mucus production.  She also reports abdominal pain and nausea with the chest symptoms.  She is currently smoking 5 black and milds per day.  She has been smoking since age [age]  She was previously smoking cigarettes about 2 packs/day throughout her 89sand 30s.  She [place] [employer] lots of wood dust.  She also smokes marijuana on a regular basis.  Past Medical History:  Diagnosis Date   Acute ankle pain 06/10/2018   Capsulitis 11/28/2019   Carpal tunnel syndrome of right wrist 02/27/2020   Dysmenorrhea    Encounter for orthopedic follow-up care 05/01/2021   Essential hypertension 11/28/2019   Foot pain 06/10/2018   Ganglion cyst of dorsum of left wrist  11/28/2019   History of left heart catheterization 05/19/2022   Hypertension    Other chest pain 05/19/2022   Pain in elbow 11/28/2019   Trigger thumb of right hand 03/31/2021     Family History  Problem Relation Age of Onset   Hypertension Other    Cancer Other      Social History   Socioeconomic History   Marital status: Single    Spouse name: Not on file   Number of children: Not on file   Years of education: Not on file   Highest education level: Not on file  Occupational History   Not on file  Tobacco Use   Smoking status: Every Day    Types: Cigars    Start date: 1995    Passive exposure: Current   Smokeless tobacco: Never   Tobacco comments:    Hx of vaping.  Pt started smoking at age [age]  Smokes Black and MOmnicom  Smokes about 5 cigars a day  Vaping Use   Vaping Use: Former   Substances: Nicotine  Substance and Sexual Activity   Alcohol use: No   Drug use: Not Currently   Sexual activity: Never    Birth control/protection: Surgical  Other Topics Concern   Not on file  Social History Narrative   Not on file   Social Determinants of Health   Financial Resource Strain: Not on file  Food Insecurity: Not on file  Transportation Needs: Not  on file  Physical Activity: Not on file  Stress: Not on file  Social Connections: Not on file  Intimate Partner Violence: Not on file     No Known Allergies   Outpatient Medications Prior to Visit  Medication Sig Dispense Refill   amLODipine (NORVASC) 10 MG tablet Take 1 tablet (10 mg total) by mouth daily. 30 tablet 1   ASPIRIN LOW DOSE 81 MG tablet Take 81 mg by mouth daily.     Capsaicin-Menthol-Methyl Sal (CAPSAICIN-METHYL SAL-MENTHOL) 0.025-1-12 % CREA Apply 1 application topically every 6 (six) hours as needed. Rubbed onto your abdomen every 6 hours if needed for pain and nausea 56.6 g 1   clopidogrel (PLAVIX) 75 MG tablet Take 1 tablet (75 mg total) by mouth daily. 30 tablet 11   fluticasone-salmeterol  (ADVAIR HFA) 115-21 MCG/ACT inhaler Inhale 2 puffs into the lungs 2 (two) times daily. 1 each 12   hydrochlorothiazide (HYDRODIURIL) 25 MG tablet Take 1 tablet (25 mg total) by mouth daily. 30 tablet 1   ibuprofen (ADVIL) 600 MG tablet Take 1 tablet (600 mg total) by mouth every 6 (six) hours as needed. 30 tablet 0   metoprolol succinate (TOPROL-XL) 25 MG 24 hr tablet Take 25 mg by mouth daily.     omeprazole (PRILOSEC) 20 MG capsule Take 1 capsule (20 mg total) by mouth daily. 30 capsule 3   nitroGLYCERIN (NITROSTAT) 0.4 MG SL tablet Place 1 tablet (0.4 mg total) under the tongue every 5 (five) minutes as needed. 25 tablet 6   rosuvastatin (CRESTOR) 10 MG tablet Take 1 tablet (10 mg total) by mouth daily. 30 tablet 11   No facility-administered medications prior to visit.    Review of Systems  Constitutional:  Negative for chills, fever, malaise/fatigue and weight loss.  HENT:  Negative for congestion, sinus pain and sore throat.   Eyes: Negative.   Respiratory:  Negative for cough, hemoptysis, sputum production, shortness of breath and wheezing.   Cardiovascular:  Negative for chest pain, palpitations, orthopnea, claudication and leg swelling.  Gastrointestinal:  Negative for abdominal pain, heartburn, nausea and vomiting.  Genitourinary: Negative.   Musculoskeletal:  Negative for joint pain and myalgias.  Skin:  Negative for rash.  Neurological:  Negative for weakness.  Endo/Heme/Allergies: Negative.   Psychiatric/Behavioral: Negative.     Objective:   Vitals:   01/18/23 1449  BP: (!) 140/86  Pulse: 76  SpO2: 99%  Weight: 183 lb 3.2 oz (83.1 kg)  Height: 5' (1.524 m)     Physical Exam Constitutional:      General: She is not in acute distress.    Appearance: She is not ill-appearing.  HENT:     Head: Normocephalic and atraumatic.  Eyes:     General: No scleral icterus.    Conjunctiva/sclera: Conjunctivae normal.  Cardiovascular:     Rate and Rhythm: Normal rate and  regular rhythm.     Pulses: Normal pulses.     Heart sounds: Normal heart sounds. No murmur heard. Pulmonary:     Effort: Pulmonary effort is normal.     Breath sounds: Normal breath sounds. No wheezing, rhonchi or rales.  Musculoskeletal:     Right lower leg: No edema.     Left lower leg: No edema.  Skin:    General: Skin is warm and dry.  Neurological:     General: No focal deficit present.     Mental Status: She is alert.    CBC    Component Value Date/Time  WBC 7.7 09/30/2022 2051   RBC 4.29 09/30/2022 2051   HGB 13.9 12/28/2022 0736   HCT 41.0 12/28/2022 0736   PLT 325 09/30/2022 2051   MCV 92.8 09/30/2022 2051   MCH 31.7 09/30/2022 2051   MCHC 34.2 09/30/2022 2051   RDW 14.7 09/30/2022 2051   LYMPHSABS 4.0 09/30/2022 2051   MONOABS 0.6 09/30/2022 2051   EOSABS 0.2 09/30/2022 2051   BASOSABS 0.0 09/30/2022 2051      Latest Ref Rng & Units 12/28/2022    7:36 AM 09/30/2022    8:51 PM 06/18/2022    9:47 PM  BMP  Glucose 70 - 99 mg/dL 94  70  102   BUN 6 - 20 mg/dL '7  6  7   '$ Creatinine 0.44 - 1.00 mg/dL 0.60  0.59  0.64   Sodium 135 - 145 mmol/L 140  138  137   Potassium 3.5 - 5.1 mmol/L 3.9  3.3  3.7   Chloride 98 - 111 mmol/L 104  106  105   CO2 22 - 32 mmol/L  25  25   Calcium 8.9 - 10.3 mg/dL  8.6  8.8    Chest imaging: CTA Chest 10/31/22 Cardiovascular: The study is high quality for the evaluation of pulmonary embolism. There are no filling defects in the central, lobar, segmental or subsegmental pulmonary artery branches to suggest acute pulmonary embolism. Great vessels are normal in course and caliber. Normal heart size.No significant pericardial fluid/thickening.   Mediastinum/Nodes: Partially imaged thyroid gland without nodules meeting criteria for imaging follow-up by size. Normal esophagus. 13 mm right hilar node (4:38).   Lungs/Pleura: The central airways are patent. Mild bilateral lower lobe bronchial wall thickening. No focal consolidation.  Mild mosaic attenuation of bilateral lower lobes. 3 mm left perifissural lymph node (5:34) and 6 mm triangular nodule along the right minor fissure (5:47), also likely perifissural lymph node, unchanged from 07/03/2014. No follow-up imaging recommended. No pneumothorax. No pleural effusion.  CXR 09/30/22 The heart size and mediastinal contours are within normal limits. Mild subsegmental atelectasis or scarring is noted in the mid left lung. No acute osseous abnormality.  PFT:    Latest Ref Rng & Units 01/18/2023    1:51 PM  PFT Results  FVC-Pre L 2.27  P  FVC-Predicted Pre % 71  P  FVC-Post L 2.19  P  FVC-Predicted Post % 68  P  Pre FEV1/FVC % % 82  P  Post FEV1/FCV % % 90  P  FEV1-Pre L 1.87  P  FEV1-Predicted Pre % 72  P  FEV1-Post L 1.98  P  DLCO uncorrected ml/min/mmHg 14.47  P  DLCO UNC% % 76  P  DLCO corrected ml/min/mmHg 14.47  P  DLCO COR %Predicted % 76  P  DLVA Predicted % 87  P  TLC L 3.84  P  TLC % Predicted % 86  P  RV % Predicted % 96  P    P Preliminary result    Labs:  Path:  Echo:  Heart Catheterization:  Assessment & Plan:   Mild persistent asthma without complication  Discussion: Kimarie Gonzalo is a 42 year old woman, daily cigar smoker with hypertension who returns to pulmonary clinic for shortness of breath.   She has had significant improvement/resolution of her chest pain and dyspnea since starting ICS/LABA inhaler therapy.   Her recent CT chest scan was unremarkable. PFTs show non-specific pulmonary function with low fev1 and fvc with normal TLC.   She  is to continue advair HFA 115-77mg 2 puffs twice daily.   Recommend complete smoking cessation and wearing mask while at work as she is exposed to wood dusts.  She is to follow up in 1 year.  JFreda Jackson MD [facility] Office: [phone]  Current Outpatient Medications:    amLODipine (NORVASC) 10 MG tablet, Take 1 tablet (10 mg total) by mouth  daily., Disp: 30 tablet, Rfl: 1   ASPIRIN LOW DOSE 81 MG tablet, Take 81 mg by mouth daily., Disp: , Rfl:    Capsaicin-Menthol-Methyl Sal (CAPSAICIN-METHYL SAL-MENTHOL) 0.025-1-12 % CREA, Apply 1 application topically every 6 (six) hours as needed. Rubbed onto your abdomen every 6 hours if needed for pain and nausea, Disp: 56.6 g, Rfl: 1   clopidogrel (PLAVIX) 75 MG tablet, Take 1 tablet (75 mg total) by mouth daily., Disp: 30 tablet, Rfl: 11   fluticasone-salmeterol (ADVAIR HFA) 115-21 MCG/ACT inhaler, Inhale 2 puffs into the lungs 2 (two) times daily., Disp: 1 each, Rfl: 12   hydrochlorothiazide (HYDRODIURIL) 25 MG tablet, Take 1 tablet (25 mg total) by mouth daily., Disp: 30 tablet, Rfl: 1   ibuprofen (ADVIL) 600 MG tablet, Take 1 tablet (600 mg total) by mouth every 6 (six) hours as needed., Disp: 30 tablet, Rfl: 0   metoprolol succinate (TOPROL-XL) 25 MG 24 hr tablet, Take 25 mg by mouth daily., Disp: , Rfl:    omeprazole (PRILOSEC) 20 MG capsule, Take 1 capsule (20 mg total) by mouth daily., Disp: 30 capsule, Rfl: 3   nitroGLYCERIN (NITROSTAT) 0.4 MG SL tablet, Place 1 tablet (0.4 mg total) under the tongue every 5 (five) minutes as needed., Disp: 25 tablet, Rfl: 6   rosuvastatin (CRESTOR) 10 MG tablet, Take 1 tablet (10 mg total) by mouth daily., Disp: 30 tablet, Rfl: 11

## 2023-01-18 NOTE — Patient Instructions (Addendum)
Continue advair inhaler 2 puffs twice daily - rinse mouth out after each use   Your breathing tests show non-specific pulmonary function test, meaning it does not fall into a specific abnormal category but there is a significant change that it will in the future.   Recommend stopping smoking all together to prevent further damage to your lungs and recommend wearing an N95 mask while at work to avoid inhaling wood dusts.   Follow up in 1 year

## 2023-01-18 NOTE — Progress Notes (Signed)
Full PFT performed today. °

## 2023-01-21 ENCOUNTER — Encounter: Payer: Self-pay | Admitting: Pulmonary Disease

## 2023-01-26 ENCOUNTER — Other Ambulatory Visit: Payer: Self-pay

## 2023-01-26 DIAGNOSIS — I771 Stricture of artery: Secondary | ICD-10-CM

## 2023-01-26 DIAGNOSIS — I739 Peripheral vascular disease, unspecified: Secondary | ICD-10-CM

## 2023-01-29 ENCOUNTER — Telehealth: Payer: Self-pay | Admitting: Internal Medicine

## 2023-01-29 MED ORDER — ALBUTEROL SULFATE HFA 108 (90 BASE) MCG/ACT IN AERS: 2.0000 | INHALATION_SPRAY | Freq: Four times a day (QID) | RESPIRATORY_TRACT | 6 refills | Status: AC | PRN

## 2023-01-29 NOTE — Telephone Encounter (Signed)
Pt calling. Lost her inhaler. The purple one.Had a terrible attack last night. Pls call @ [phone]  [facility] [address]

## 2023-01-29 NOTE — Telephone Encounter (Signed)
Called and spoke with patient. She stated that she lost her Advair inhaler last night and has been having a hard time breathing. She wasn't sure that her insurance would pay for another inhaler as she could not remember when she received the inhaler. I advised her I would call the pharmacy to see.   Called and spoke with the pharmacy rep. She stated that the Advair was last picked up on 02/06 and she is eligible for a refill. She will go ahead and process the refill and it will be ready tomorrow at [place] patient and made her aware of the above information. She verbalized understanding. She also mentioned that she needs a refill on her albuterol inhaler. I advised her I would send this in.   Nothing further needed at time of call.

## 2023-01-30 ENCOUNTER — Encounter (HOSPITAL_BASED_OUTPATIENT_CLINIC_OR_DEPARTMENT_OTHER): Payer: Self-pay

## 2023-01-30 ENCOUNTER — Emergency Department (HOSPITAL_BASED_OUTPATIENT_CLINIC_OR_DEPARTMENT_OTHER): Payer: BC Managed Care – PPO

## 2023-01-30 ENCOUNTER — Other Ambulatory Visit: Payer: Self-pay

## 2023-01-30 ENCOUNTER — Emergency Department (HOSPITAL_BASED_OUTPATIENT_CLINIC_OR_DEPARTMENT_OTHER)
Admission: EM | Admit: 2023-01-30 | Discharge: 2023-01-30 | Disposition: A | Discharge status: Home / Self Care | Payer: BC Managed Care – PPO | Attending: Emergency Medicine | Admitting: Emergency Medicine

## 2023-01-30 DIAGNOSIS — R0602 Shortness of breath: Secondary | ICD-10-CM | POA: Diagnosis not present

## 2023-01-30 DIAGNOSIS — F17200 Nicotine dependence, unspecified, uncomplicated: Secondary | ICD-10-CM | POA: Diagnosis not present

## 2023-01-30 DIAGNOSIS — I10 Essential (primary) hypertension: Secondary | ICD-10-CM | POA: Insufficient documentation

## 2023-01-30 DIAGNOSIS — Z7982 Long term (current) use of aspirin: Secondary | ICD-10-CM | POA: Insufficient documentation

## 2023-01-30 DIAGNOSIS — R519 Headache, unspecified: Secondary | ICD-10-CM | POA: Diagnosis not present

## 2023-01-30 DIAGNOSIS — Z79899 Other long term (current) drug therapy: Secondary | ICD-10-CM | POA: Diagnosis not present

## 2023-01-30 DIAGNOSIS — Z7902 Long term (current) use of antithrombotics/antiplatelets: Secondary | ICD-10-CM | POA: Diagnosis not present

## 2023-01-30 DIAGNOSIS — Z7951 Long term (current) use of inhaled steroids: Secondary | ICD-10-CM | POA: Insufficient documentation

## 2023-01-30 DIAGNOSIS — R059 Cough, unspecified: Secondary | ICD-10-CM | POA: Diagnosis not present

## 2023-01-30 DIAGNOSIS — Z20822 Contact with and (suspected) exposure to covid-19: Secondary | ICD-10-CM | POA: Insufficient documentation

## 2023-01-30 DIAGNOSIS — J45909 Unspecified asthma, uncomplicated: Secondary | ICD-10-CM | POA: Insufficient documentation

## 2023-01-30 DIAGNOSIS — B349 Viral infection, unspecified: Secondary | ICD-10-CM | POA: Diagnosis not present

## 2023-01-30 LAB — RESP PANEL BY RT-PCR (RSV, FLU A&B, COVID)  RVPGX2
Influenza A by PCR: NEGATIVE
Influenza B by PCR: NEGATIVE
Resp Syncytial Virus by PCR: NEGATIVE
SARS Coronavirus 2 by RT PCR: NEGATIVE

## 2023-01-30 MED ORDER — BENZONATATE 100 MG PO CAPS: 100.0000 mg | ORAL_CAPSULE | Freq: Three times a day (TID) | ORAL | 0 refills | Status: AC

## 2023-01-30 MED ORDER — ACETAMINOPHEN 500 MG PO TABS: 500.0000 mg | ORAL_TABLET | Freq: Four times a day (QID) | ORAL | 0 refills | Status: AC | PRN

## 2023-01-30 MED ORDER — AMLODIPINE BESYLATE 5 MG PO TABS
5.0000 mg | ORAL_TABLET | Freq: Once | ORAL | Status: AC
  Administered 2023-01-30: 5 mg via ORAL
  Filled 2023-01-30: qty 1

## 2023-01-30 MED ORDER — BENZONATATE 100 MG PO CAPS
100.0000 mg | ORAL_CAPSULE | Freq: Once | ORAL | Status: AC
  Administered 2023-01-30: 100 mg via ORAL
  Filled 2023-01-30: qty 1

## 2023-01-30 MED ORDER — ACETAMINOPHEN 325 MG PO TABS
650.0000 mg | ORAL_TABLET | Freq: Once | ORAL | Status: AC | PRN
  Administered 2023-01-30: 650 mg via ORAL
  Filled 2023-01-30: qty 2

## 2023-01-30 NOTE — Discharge Instructions (Addendum)
You have been evaluated for your symptoms.  You have tested negative for COVID, flu, or RSV.  Your chest x-ray did not show any signs of pneumonia.  Your symptoms likely due to a viral infection.  Take medication prescribed as needed for your symptoms and follow-up with your doctor for further care, return if any concern.

## 2023-01-30 NOTE — ED Triage Notes (Signed)
Pt here for shob, chills, bodyaches, HA, and cough x2 days. Pt reports she was around people who were smoking and it caused an asthma flare up. Pt reports dizziness when walking. Pt reports decreased appetite and oral intake.

## 2023-01-30 NOTE — ED Provider Notes (Signed)
[facility] Provider Note   CSN: [id number] Arrival date & time: 01/30/23  1638     History  Chief Complaint  Patient presents with   URI    Janice French is a 42 y.o. female.  The history is provided by the patient and medical records. No language interpreter was used.  URI    42 year old female significant history of hypertension currently on amlodipine, and metoprolol, obesity, tobacco use, asthma, presenting with complaints of cold symptoms.  Patient states for the past 2 to 3 days she has had productive cough, chills, body aches, headache, throat irritation, lightheadedness and dizziness with decreased appetite.  She endorsed recent sick exposure.  She is trying to rest at home without adequate relief.  She does not endorse nausea vomiting diarrhea no significant shortness of breath no dysuria.  She admits that she has elevated blood pressure and noticed that blood pressure is high due to her pain.  She did take her blood pressure medication today.  Home Medications Prior to Admission medications   Medication Sig Start Date End Date Taking? Authorizing Provider  albuterol (VENTOLIN HFA) 108 (90 Base) MCG/ACT inhaler Inhale 2 puffs into the lungs every 6 (six) hours as needed for wheezing or shortness of breath. 01/29/23   Freddi Starr, MD  amLODipine (NORVASC) 10 MG tablet Take 1 tablet (10 mg total) by mouth daily. 03/03/22   Smoot, Leary Roca, PA-C  ASPIRIN LOW DOSE 81 MG tablet Take 81 mg by mouth daily. 03/31/22   [provider]  Capsaicin-Menthol-Methyl Sal (CAPSAICIN-METHYL SAL-MENTHOL) 0.025-1-12 % CREA Apply 1 application topically every 6 (six) hours as needed. Rubbed onto your abdomen every 6 hours if needed for pain and nausea 06/23/19   Charlesetta Shanks, MD  clopidogrel (PLAVIX) 75 MG tablet Take 1 tablet (75 mg total) by mouth daily. 12/28/22 12/28/23  Waynetta Sandy, MD  fluticasone-salmeterol (ADVAIR HFA)  [phone] MCG/ACT inhaler Inhale 2 puffs into the lungs 2 (two) times daily. 10/29/22   Freddi Starr, MD  hydrochlorothiazide (HYDRODIURIL) 25 MG tablet Take 1 tablet (25 mg total) by mouth daily. 03/04/22   Mayers, Cari S, PA-C  ibuprofen (ADVIL) 600 MG tablet Take 1 tablet (600 mg total) by mouth every 6 (six) hours as needed. 06/18/22   Wilnette Kales, PA  metoprolol succinate (TOPROL-XL) 25 MG 24 hr tablet Take 25 mg by mouth daily. 07/08/22   [provider]  nitroGLYCERIN (NITROSTAT) 0.4 MG SL tablet Place 1 tablet (0.4 mg total) under the tongue every 5 (five) minutes as needed. 10/12/22 01/10/23  Revankar, Reita Cliche, MD  omeprazole (PRILOSEC) 20 MG capsule Take 1 capsule (20 mg total) by mouth daily. 03/04/22   Mayers, Cari S, PA-C  rosuvastatin (CRESTOR) 10 MG tablet Take 1 tablet (10 mg total) by mouth daily. 02/04/21 02/04/22  Horton, Barbette Hair, MD  promethazine (PHENERGAN) 25 MG tablet Take 1 tablet (25 mg total) by mouth every 6 (six) hours as needed for nausea or vomiting. 06/23/19 08/06/20  Charlesetta Shanks, MD      Allergies    Patient has no known allergies.    Review of Systems   Review of Systems  All other systems reviewed and are negative.   Physical Exam Updated Vital Signs BP (!) 207/145 (BP Location: Right Arm)   Pulse 98   Temp 98.9 F (37.2 C) (Oral)   Resp 20   SpO2 100%  Physical Exam Vitals and nursing note  reviewed.  Constitutional:      General: She is not in acute distress.    Appearance: She is well-developed.  HENT:     Head: Atraumatic.     Mouth/Throat:     Mouth: Mucous membranes are moist.     Pharynx: No posterior oropharyngeal erythema.  Eyes:     Conjunctiva/sclera: Conjunctivae normal.  Cardiovascular:     Rate and Rhythm: Normal rate and regular rhythm.     Pulses: Normal pulses.     Heart sounds: Normal heart sounds.  Pulmonary:     Effort: Pulmonary effort is normal.     Breath sounds: No wheezing, rhonchi or rales.   Abdominal:     Palpations: Abdomen is soft.     Tenderness: There is no abdominal tenderness.  Musculoskeletal:     Cervical back: Normal range of motion and neck supple. No rigidity or tenderness.  Lymphadenopathy:     Cervical: No cervical adenopathy.  Skin:    Findings: No rash.  Neurological:     Mental Status: She is alert. Mental status is at baseline.  Psychiatric:        Mood and Affect: Mood normal.     ED Results / Procedures / Treatments   Labs (all labs ordered are listed, but only abnormal results are displayed) Labs Reviewed  RESP PANEL BY RT-PCR (RSV, FLU A&B, COVID)  [id number]    EKG None  Radiology DG Chest 2 View  Result Date: 01/30/2023 CLINICAL DATA:  Two day history of cough, shortness of breath, body aches, and headaches EXAM: CHEST - 2 VIEW COMPARISON:  Chest radiograph dated 09/30/2022 FINDINGS: Normal lung volumes. No focal consolidations. No pleural effusion or pneumothorax. The heart size and mediastinal contours are within normal limits. The visualized skeletal structures are unremarkable. Right upper quadrant surgical clips. IMPRESSION: No active cardiopulmonary disease. Electronically Signed   By: Darrin Nipper M.D.   On: 01/30/2023 17:25    Procedures Procedures    Medications Ordered in ED Medications  acetaminophen (TYLENOL) tablet 650 mg (650 mg Oral Given 01/30/23 1650)  amLODipine (NORVASC) tablet 5 mg (5 mg Oral Given 01/30/23 1717)  benzonatate (TESSALON) capsule 100 mg (100 mg Oral Given 01/30/23 1717)    ED Course/ Medical Decision Making/ A&P                             Medical Decision Making Amount and/or Complexity of Data Reviewed Radiology: ordered.  Risk OTC drugs. Prescription drug management.   BP (!) 207/145 (BP Location: Right Arm)   Pulse 98   Temp 98.9 F (37.2 C) (Oral)   Resp 20   SpO2 100%   65:14 PM 42 year old female significant history of hypertension currently on amlodipine, and metoprolol, obesity, tobacco  use, asthma, presenting with complaints of cold symptoms.  Patient states for the past 2 to 3 days she has had productive cough, chills, body aches, headache, throat irritation, lightheadedness and dizziness with decreased appetite.  She endorsed recent sick exposure.  She is trying to rest at home without adequate relief.  She does not endorse nausea vomiting diarrhea no significant shortness of breath no dysuria.  She admits that she has elevated blood pressure and noticed that blood pressure is high due to her pain.  She did take her blood pressure medication today.  On exam, patient is actively coughing and appears uncomfortable however she is nontoxic in appearance.  Heart with normal rate  and rhythm, lungs are clear to auscultation bilaterally abdomen is soft nontender, throat exam unremarkable, neck exam unremarkable.  Vital signs notable for elevated blood pressure of 207/145.  She is afebrile, no hypoxia.  Will recheck blood pressure, if elevated will give blood pressure medication.  Anticipate poorly control hypertension as well as having pain discomfort is causing elevated blood pressure.  -Labs ordered, independently viewed and interpreted by me.  Labs remarkable for negative covid/flu/rsv -The patient was maintained on a cardiac monitor.  I personally viewed and interpreted the cardiac monitored which showed an underlying rhythm of: NSR -Imaging independently viewed and interpreted by me and I agree with radiologist's interpretation.  Result remarkable for CXR without concerning changes -This patient presents to the ED for concern of cold sxs, this involves an extensive number of treatment options, and is a complaint that carries with it a high risk of complications and morbidity.  The differential diagnosis includes covid, flu, rsv, pna, hypertensive emergency -Co morbidities that complicate the patient evaluation includes HTN, tobacco use -Treatment includes norvasc, tessalon,  tylenol -Reevaluation of the patient after these medicines showed that the patient improved -PCP office notes or outside notes reviewed -Escalation to admission/observation considered: patients feels much better, is comfortable with discharge, and will follow up with PCP -Prescription medication considered, patient comfortable with tessalon, tylenol -Social Determinant of Health considered which includes tobacco use  On reassessment, patient felt better after receiving blood pressure medication and cough medication.  Blood pressure improved as well.  No concerning finding noted today and I suspect patient has a viral infection.  Return precaution given.         Final Clinical Impression(s) / ED Diagnoses Final diagnoses:  Viral syndrome    Rx / DC Orders ED Discharge Orders          Ordered    benzonatate (TESSALON) 100 MG capsule  Every 8 hours        01/30/23 1835    acetaminophen (TYLENOL) 500 MG tablet  Every 6 hours PRN        01/30/23 1835              Domenic Moras, PA-C 01/30/23 1836    Cristie Hem, MD 01/31/23 1510

## 2023-02-01 DIAGNOSIS — H1031 Unspecified acute conjunctivitis, right eye: Secondary | ICD-10-CM | POA: Diagnosis not present

## 2023-02-04 DIAGNOSIS — R519 Headache, unspecified: Secondary | ICD-10-CM | POA: Diagnosis not present

## 2023-02-04 DIAGNOSIS — J4520 Mild intermittent asthma, uncomplicated: Secondary | ICD-10-CM | POA: Diagnosis not present

## 2023-02-04 DIAGNOSIS — R0981 Nasal congestion: Secondary | ICD-10-CM | POA: Diagnosis not present

## 2023-02-04 DIAGNOSIS — J029 Acute pharyngitis, unspecified: Secondary | ICD-10-CM | POA: Diagnosis not present

## 2023-02-04 DIAGNOSIS — F17210 Nicotine dependence, cigarettes, uncomplicated: Secondary | ICD-10-CM | POA: Diagnosis not present

## 2023-02-04 DIAGNOSIS — J069 Acute upper respiratory infection, unspecified: Secondary | ICD-10-CM | POA: Diagnosis not present

## 2023-02-10 ENCOUNTER — Ambulatory Visit (INDEPENDENT_AMBULATORY_CARE_PROVIDER_SITE_OTHER)
Admission: RE | Admit: 2023-02-10 | Discharge: 2023-02-10 | Disposition: A | Discharge status: Home / Self Care | Payer: BC Managed Care – PPO | Source: Ambulatory Visit | Attending: Vascular Surgery | Admitting: Vascular Surgery

## 2023-02-10 ENCOUNTER — Encounter: Payer: Self-pay | Admitting: Vascular Surgery

## 2023-02-10 ENCOUNTER — Ambulatory Visit (HOSPITAL_COMMUNITY)
Admission: RE | Admit: 2023-02-10 | Discharge: 2023-02-10 | Disposition: A | Discharge status: Home / Self Care | Payer: BC Managed Care – PPO | Source: Ambulatory Visit | Attending: Vascular Surgery | Admitting: Vascular Surgery

## 2023-02-10 ENCOUNTER — Ambulatory Visit (INDEPENDENT_AMBULATORY_CARE_PROVIDER_SITE_OTHER): Payer: BC Managed Care – PPO | Admitting: Physician Assistant

## 2023-02-10 VITALS — BP 183/124 | HR 83 | Temp 98.0°F | Wt 180.0 lb

## 2023-02-10 DIAGNOSIS — I771 Stricture of artery: Secondary | ICD-10-CM | POA: Insufficient documentation

## 2023-02-10 DIAGNOSIS — I739 Peripheral vascular disease, unspecified: Secondary | ICD-10-CM | POA: Diagnosis not present

## 2023-02-10 DIAGNOSIS — H1033 Unspecified acute conjunctivitis, bilateral: Secondary | ICD-10-CM | POA: Diagnosis not present

## 2023-02-10 LAB — VAS US ABI WITH/WO TBI
Left ABI: 1.02
Right ABI: 0.97

## 2023-02-10 NOTE — Progress Notes (Signed)
Office Note     CC:  follow up Requesting Provider:  Dorna Mai, MD  HPI: Janice French is a 42 y.o. (15-Dec-1980) female who presents status post stenting of the left common and external iliac arteries by Dr. Donzetta Matters on 12/28/2022.  This was performed due to in-stent restenosis with lifestyle limiting claudication of the left lower extremity.  Claudication has resolved since this last procedure.  She is on Plavix daily however not taking a baby aspirin.  She states she has stopped smoking.  She denies any rest pain or tissue loss of bilateral lower extremities.   Past Medical History:  Diagnosis Date   Acute ankle pain 06/10/2018   Capsulitis 11/28/2019   Carpal tunnel syndrome of right wrist 02/27/2020   Dysmenorrhea    Encounter for orthopedic follow-up care 05/01/2021   Essential hypertension 11/28/2019   Foot pain 06/10/2018   Ganglion cyst of dorsum of left wrist 11/28/2019   History of left heart catheterization 05/19/2022   Hypertension    Other chest pain 05/19/2022   Pain in elbow 11/28/2019   Trigger thumb of right hand 03/31/2021    Past Surgical History:  Procedure Laterality Date   ABDOMINAL AORTOGRAM W/LOWER EXTREMITY Bilateral 08/19/2020   Procedure: ABDOMINAL AORTOGRAM W/LOWER EXTREMITY;  Surgeon: Waynetta Sandy, MD;  Location: [facility];  Service: Cardiovascular;  Laterality: Bilateral;   ABDOMINAL AORTOGRAM W/LOWER EXTREMITY N/A 12/28/2022   Procedure: ABDOMINAL AORTOGRAM W/LOWER EXTREMITY;  Surgeon: Waynetta Sandy, MD;  Location: [facility];  Service: Cardiovascular;  Laterality: N/A;   CHOLECYSTECTOMY     PERIPHERAL VASCULAR INTERVENTION Left 08/19/2020   Procedure: PERIPHERAL VASCULAR INTERVENTION;  Surgeon: Waynetta Sandy, MD;  Location: [facility];  Service: Cardiovascular;  Laterality: Left;  Common iliac   TUBAL LIGATION      Social History   Socioeconomic History   Marital status: Single     Spouse name: Not on file   Number of children: Not on file   Years of education: Not on file   Highest education level: Not on file  Occupational History   Not on file  Tobacco Use   Smoking status: Every Day    Types: Cigars    Start date: 26    Passive exposure: Current   Smokeless tobacco: Never   Tobacco comments:    Hx of vaping.  Pt started smoking at age 8.  Smokes [employer] and [employer].  Smokes about 5 cigars a day  Vaping Use   Vaping Use: Former   Substances: Nicotine  Substance and Sexual Activity   Alcohol use: No   Drug use: Not Currently   Sexual activity: Never    Birth control/protection: Surgical  Other Topics Concern   Not on file  Social History Narrative   Not on file   Social Determinants of Health   Financial Resource Strain: Not on file  Food Insecurity: Not on file  Transportation Needs: Not on file  Physical Activity: Not on file  Stress: Not on file  Social Connections: Not on file  Intimate Partner Violence: Not on file    Family History  Problem Relation Age of Onset   Hypertension Other    Cancer Other     Current Outpatient Medications  Medication Sig Dispense Refill   acetaminophen (TYLENOL) 500 MG tablet Take 1 tablet (500 mg total) by mouth every 6 (six) hours as needed. 30 tablet 0   albuterol (VENTOLIN HFA) 108 (90 Base)  MCG/ACT inhaler Inhale 2 puffs into the lungs every 6 (six) hours as needed for wheezing or shortness of breath. 8 g 6   amLODipine (NORVASC) 10 MG tablet Take 1 tablet (10 mg total) by mouth daily. 30 tablet 1   ASPIRIN LOW DOSE 81 MG tablet Take 81 mg by mouth daily.     benzonatate (TESSALON) 100 MG capsule Take 1 capsule (100 mg total) by mouth every 8 (eight) hours. 21 capsule 0   Capsaicin-Menthol-Methyl Sal (CAPSAICIN-METHYL SAL-MENTHOL) 0.025-1-12 % CREA Apply 1 application topically every 6 (six) hours as needed. Rubbed onto your abdomen every 6 hours if needed for pain and nausea 56.6 g 1    clopidogrel (PLAVIX) 75 MG tablet Take 1 tablet (75 mg total) by mouth daily. 30 tablet 11   fluticasone-salmeterol (ADVAIR HFA) 115-21 MCG/ACT inhaler Inhale 2 puffs into the lungs 2 (two) times daily. 1 each 12   hydrochlorothiazide (HYDRODIURIL) 25 MG tablet Take 1 tablet (25 mg total) by mouth daily. 30 tablet 1   ibuprofen (ADVIL) 600 MG tablet Take 1 tablet (600 mg total) by mouth every 6 (six) hours as needed. 30 tablet 0   metoprolol succinate (TOPROL-XL) 25 MG 24 hr tablet Take 25 mg by mouth daily.     omeprazole (PRILOSEC) 20 MG capsule Take 1 capsule (20 mg total) by mouth daily. 30 capsule 3   nitroGLYCERIN (NITROSTAT) 0.4 MG SL tablet Place 1 tablet (0.4 mg total) under the tongue every 5 (five) minutes as needed. 25 tablet 6   rosuvastatin (CRESTOR) 10 MG tablet Take 1 tablet (10 mg total) by mouth daily. 30 tablet 11   No current facility-administered medications for this visit.    No Known Allergies   REVIEW OF SYSTEMS:   [X]  denotes positive finding, [ ]  denotes negative finding Cardiac  Comments:  Chest pain or chest pressure:    Shortness of breath upon exertion:    Short of breath when lying flat:    Irregular heart rhythm:        Vascular    Pain in calf, thigh, or hip brought on by ambulation:    Pain in feet at night that wakes you up from your sleep:     Blood clot in your veins:    Leg swelling:         Pulmonary    Oxygen at home:    Productive cough:     Wheezing:         Neurologic    Sudden weakness in arms or legs:     Sudden numbness in arms or legs:     Sudden onset of difficulty speaking or slurred speech:    Temporary loss of vision in one eye:     Problems with dizziness:         Gastrointestinal    Blood in stool:     Vomited blood:         Genitourinary    Burning when urinating:     Blood in urine:        Psychiatric    Major depression:         Hematologic    Bleeding problems:    Problems with blood clotting too easily:         Skin    Rashes or ulcers:        Constitutional    Fever or chills:      PHYSICAL EXAMINATION:  Vitals:   02/10/23 1059  BP: Marland Kitchen)  183/124  Pulse: 83  Temp: 98 F (36.7 C)  TempSrc: Temporal  SpO2: 100%  Weight: 180 lb (81.6 kg)    General:  WDWN in NAD; vital signs documented above Gait: Not observed HENT: WNL, normocephalic Pulmonary: normal non-labored breathing , without Rales, rhonchi,  wheezing Cardiac: regular HR Abdomen: soft, NT, no masses Skin: without rashes Vascular Exam/Pulses: Palpable right femoral pulse without hematoma; absent left DP pulse; 2+ left PT pulse Extremities: without ischemic changes, without Gangrene , without cellulitis; without open wounds;  Musculoskeletal: no muscle wasting or atrophy  Neurologic: A&O X 3;  No focal weakness or paresthesias are detected Psychiatric:  The pt has Normal affect.   Non-Invasive Vascular Imaging:   Widely patent left iliac stents  ABI/TBIToday's ABIToday's TBIPrevious ABIPrevious TBI  +-------+-----------+-----------+------------+------------+  Right 0.97       0.96       1.07        0.89          +-------+-----------+-----------+------------+------------+  Left  1.02       0.52       0.94        0.43          +-------+-----------+-----------+------------+-----------    ASSESSMENT/PLAN:: 42 y.o. female status post restenting of the left common and external iliac arteries due to recurrent stenosis  -Left leg claudication has completely resolved since her procedure.  Duplex demonstrates widely patent left common and external iliac artery stents.  She has a palpable 2+ PT pulse on exam.  She she has no sign of hematoma or pseudoaneurysm from the right groin.  She has been taking Plavix daily however stopped taking her baby aspirin.  I encouraged her to resume her baby aspirin as part of the dual antiplatelet therapy regimen that we would like her on.  She states she has stopped smoking.   I stressed the importance of continued smoking cessation.  We will repeat aortoiliac duplex and ABIs in 6 months.   Dagoberto Ligas, PA-C [facility] [phone]  Clinic MD:   Donzetta Matters

## 2023-02-12 ENCOUNTER — Other Ambulatory Visit: Payer: Self-pay

## 2023-02-12 DIAGNOSIS — I739 Peripheral vascular disease, unspecified: Secondary | ICD-10-CM

## 2023-02-12 DIAGNOSIS — I771 Stricture of artery: Secondary | ICD-10-CM

## 2023-02-17 DIAGNOSIS — B309 Viral conjunctivitis, unspecified: Secondary | ICD-10-CM | POA: Diagnosis not present

## 2023-03-10 ENCOUNTER — Encounter: Payer: Self-pay | Admitting: *Deleted

## 2023-03-12 DIAGNOSIS — B309 Viral conjunctivitis, unspecified: Secondary | ICD-10-CM | POA: Diagnosis not present

## 2023-03-23 ENCOUNTER — Other Ambulatory Visit: Payer: Self-pay | Admitting: Cardiology

## 2023-03-23 DIAGNOSIS — I209 Angina pectoris, unspecified: Secondary | ICD-10-CM

## 2023-03-23 NOTE — Telephone Encounter (Signed)
Refill sent.

## 2023-03-30 ENCOUNTER — Other Ambulatory Visit (HOSPITAL_COMMUNITY): Payer: Self-pay

## 2023-06-01 ENCOUNTER — Encounter (HOSPITAL_BASED_OUTPATIENT_CLINIC_OR_DEPARTMENT_OTHER): Payer: Self-pay

## 2023-06-01 ENCOUNTER — Other Ambulatory Visit: Payer: Self-pay

## 2023-06-01 ENCOUNTER — Emergency Department (HOSPITAL_BASED_OUTPATIENT_CLINIC_OR_DEPARTMENT_OTHER): Payer: BC Managed Care – PPO

## 2023-06-01 ENCOUNTER — Emergency Department (HOSPITAL_BASED_OUTPATIENT_CLINIC_OR_DEPARTMENT_OTHER)
Admission: EM | Admit: 2023-06-01 | Discharge: 2023-06-01 | Disposition: A | Discharge status: Home / Self Care | Payer: BC Managed Care – PPO | Attending: Emergency Medicine | Admitting: Emergency Medicine

## 2023-06-01 DIAGNOSIS — M25512 Pain in left shoulder: Secondary | ICD-10-CM | POA: Insufficient documentation

## 2023-06-01 DIAGNOSIS — I10 Essential (primary) hypertension: Secondary | ICD-10-CM | POA: Insufficient documentation

## 2023-06-01 DIAGNOSIS — R079 Chest pain, unspecified: Secondary | ICD-10-CM | POA: Diagnosis not present

## 2023-06-01 LAB — BASIC METABOLIC PANEL
Anion gap: 9 (ref 5–15)
BUN: 10 mg/dL (ref 6–20)
CO2: 26 mmol/L (ref 22–32)
Calcium: 8.9 mg/dL (ref 8.9–10.3)
Chloride: 100 mmol/L (ref 98–111)
Creatinine, Ser: 0.69 mg/dL (ref 0.44–1.00)
GFR, Estimated: >60 mL/min (ref >60)
Glucose, Bld: 107 mg/dL — ABNORMAL HIGH (ref 70–99)
Potassium: 4.1 mmol/L (ref 3.5–5.1)
Sodium: 135 mmol/L (ref 135–145)

## 2023-06-01 LAB — CBC
HCT: 38.1 % (ref 36.0–46.0)
Hemoglobin: 12.7 g/dL (ref 12.0–15.0)
MCH: 30.6 pg (ref 26.0–34.0)
MCHC: 33.3 g/dL (ref 30.0–36.0)
MCV: 91.8 fL (ref 80.0–100.0)
Platelets: 368 10*3/uL (ref 150–400)
RBC: 4.15 MIL/uL (ref 3.87–5.11)
RDW: 13.8 % (ref 11.5–15.5)
WBC: 8.6 10*3/uL (ref 4.0–10.5)
nRBC: 0 % (ref 0.0–0.2)

## 2023-06-01 LAB — TROPONIN I (HIGH SENSITIVITY): Troponin I (High Sensitivity): 2 ng/L (ref <18)

## 2023-06-01 MED ORDER — HYDROMORPHONE HCL 1 MG/ML IJ SOLN
1.0000 mg | Freq: Once | INTRAMUSCULAR | Status: AC
  Administered 2023-06-01: 1 mg via INTRAVENOUS
  Filled 2023-06-01: qty 1

## 2023-06-01 NOTE — Discharge Instructions (Signed)
You were evaluated in the Emergency Department and after careful evaluation, we did not find any emergent condition requiring admission or further testing in the hospital.  Your exam/testing today is overall reassuring.  No signs of damage.  Recommend follow-up with a orthopedic specialist or sports medicine doctor for further evaluation of your shoulder.  Continue Tylenol or Motrin for discomfort.  Please return to the Emergency Department if you experience any worsening of your condition.   Thank you for allowing [place] to be a part of your care.

## 2023-06-01 NOTE — ED Triage Notes (Signed)
Pt reports bilateral shoulder pain that started a week ago when removing wood from a machine at work. On the left side, the pain radiates down her left arm, into her neck, back and down into her bottom. The pain is worse with movement. Denies chest pain.

## 2023-06-01 NOTE — ED Provider Notes (Signed)
[facility]PT [facility] Emergency Department Provider Note MRN:  [id number]  Arrival date & time: 06/01/23     Chief Complaint   Shoulder Pain   History of Present Illness   Janice French is a 42 y.o. year-old female with no pertinent past medical history presenting to the ED with chief complaint of shoulder pain.  Pain in the left shoulder radiating down the left arm, into the chest, down into the back.  Explains that she injured her shoulder a few years ago and required a joint injection and it got better.  She was lifting heavy stuff again a few weeks ago and thinks she aggravated the shoulder.  Not getting better, trouble raising the shoulder above the horizontal level.  Pain is worse with any movement of the shoulder.  Denies dizziness or sweatiness, no nausea or vomiting, shortness of breath.  Review of Systems  A thorough review of systems was obtained and all systems are negative except as noted in the HPI and PMH.   Patient's Health History    Past Medical History:  Diagnosis Date   Acute ankle pain 06/10/2018   Capsulitis 11/28/2019   Carpal tunnel syndrome of right wrist 02/27/2020   Dysmenorrhea    Encounter for orthopedic follow-up care 05/01/2021   Essential hypertension 11/28/2019   Foot pain 06/10/2018   Ganglion cyst of dorsum of left wrist 11/28/2019   History of left heart catheterization 05/19/2022   Hypertension    Other chest pain 05/19/2022   Pain in elbow 11/28/2019   Trigger thumb of right hand 03/31/2021    Past Surgical History:  Procedure Laterality Date   ABDOMINAL AORTOGRAM W/LOWER EXTREMITY Bilateral 08/19/2020   Procedure: ABDOMINAL AORTOGRAM W/LOWER EXTREMITY;  Surgeon: Maeola Harman, MD;  Location: [facility] INVASIVE CV LAB;  Service: Cardiovascular;  Laterality: Bilateral;   ABDOMINAL AORTOGRAM W/LOWER EXTREMITY N/A 12/28/2022   Procedure: ABDOMINAL AORTOGRAM W/LOWER EXTREMITY;  Surgeon: Maeola Harman, MD;   Location: [facility] INVASIVE CV LAB;  Service: Cardiovascular;  Laterality: N/A;   CHOLECYSTECTOMY     PERIPHERAL VASCULAR INTERVENTION Left 08/19/2020   Procedure: PERIPHERAL VASCULAR INTERVENTION;  Surgeon: Maeola Harman, MD;  Location: [facility] INVASIVE CV LAB;  Service: Cardiovascular;  Laterality: Left;  Common iliac   TUBAL LIGATION      Family History  Problem Relation Age of Onset   Hypertension Other    Cancer Other     Social History   Socioeconomic History   Marital status: Single    Spouse name: Not on file   Number of children: Not on file   Years of education: Not on file   Highest education level: Not on file  Occupational History   Not on file  Tobacco Use   Smoking status: Every Day    Types: Cigars    Start date: 52    Passive exposure: Current   Smokeless tobacco: Never   Tobacco comments:    Hx of vaping.  Pt started smoking at age 47.  Smokes [employer]  Smokes about 5 cigars a day  Vaping Use   Vaping Use: Former   Substances: Nicotine  Substance and Sexual Activity   Alcohol use: No   Drug use: Not Currently   Sexual activity: Never    Birth control/protection: Surgical  Other Topics Concern   Not on file  Social History Narrative   Not on file   Social Determinants of Health   Financial Resource Strain: Not on file  Food Insecurity: Not on file  Transportation Needs: Not on file  Physical Activity: Not on file  Stress: Not on file  Social Connections: Not on file  Intimate Partner Violence: Not on file     Physical Exam   Vitals:   06/01/23 0245 06/01/23 0300  BP: (!) 167/102 (!) 177/109  Pulse: 92 94  Resp: 20 18  Temp:    SpO2: 100% 100%    CONSTITUTIONAL: Well-appearing, NAD NEURO/PSYCH:  Alert and oriented x 3, no focal deficits EYES:  eyes equal and reactive ENT/NECK:  no LAD, no JVD CARDIO: Regular rate, well-perfused, normal S1 and S2 PULM:  CTAB no wheezing or rhonchi GI/GU:  non-distended,  non-tender MSK/SPINE:  No gross deformities, no edema SKIN:  no rash, atraumatic   *Additional and/or pertinent findings included in MDM below  Diagnostic and Interventional Summary    EKG Interpretation Date/Time:  Tuesday June 01 2023 02:44:58 EDT Ventricular Rate:  86 PR Interval:  137 QRS Duration:  91 QT Interval:  367 QTC Calculation: 439 R Axis:   75  Text Interpretation: Sinus rhythm Consider left ventricular hypertrophy Confirmed by Kennis Carina [phone]) on 06/01/2023 3:18:18 AM       Labs Reviewed  BASIC METABOLIC PANEL - Abnormal; Notable for the following components:      Result Value   Glucose, Bld 107 (*)    All other components within normal limits  CBC  TROPONIN I (HIGH SENSITIVITY)    DG Chest Port 1 View  Final Result      Medications  HYDROmorphone (DILAUDID) injection 1 mg (1 mg Intravenous Given 06/01/23 0354)     Procedures  /  Critical Care Procedures  ED Course and Medical Decision Making  Initial Impression and Ddx Favoring acute on chronic rotator cuff injury with MSK chest pain.  ACS also considered but felt to be less likely.  Highly doubt dissection or pneumothorax or PE.  Past medical/surgical history that increases complexity of ED encounter: None  Interpretation of Diagnostics I personally reviewed the EKG and my interpretation is as follows: Sinus rhythm without concerning ischemic findings  Labs reassuring with no significant blood count or electrolyte disturbance.  Troponin negative.  Chest x-ray normal.  Patient Reassessment and Ultimate Disposition/Management     Discharge  Patient management required discussion with the following services or consulting groups:  None  Complexity of Problems Addressed Acute illness or injury that poses threat of life of bodily function  Additional Data Reviewed and Analyzed Further history obtained from: None  Additional Factors Impacting ED Encounter Risk Use of parenteral controlled  substances  Elmer Sow. Pilar Plate, MD [facility] Emergency Medicine [facility] [email or web address]  Final Clinical Impressions(s) / ED Diagnoses     ICD-10-CM   1. Acute pain of left shoulder  M25.512       ED Discharge Orders     None        Discharge Instructions Discussed with and Provided to Patient:     Discharge Instructions      You were evaluated in the Emergency Department and after careful evaluation, we did not find any emergent condition requiring admission or further testing in the hospital.  Your exam/testing today is overall reassuring.  No signs of damage.  Recommend follow-up with a orthopedic specialist or sports medicine doctor for further evaluation of your shoulder.  Continue Tylenol or Motrin for discomfort.  Please return to the Emergency Department if you experience any worsening of your condition.  Thank you for allowing [place] to be a part of your care.       Sabas Sous, MD 06/01/23 [phone]

## 2023-06-18 ENCOUNTER — Ambulatory Visit (INDEPENDENT_AMBULATORY_CARE_PROVIDER_SITE_OTHER): Payer: BC Managed Care – PPO | Admitting: Family Medicine

## 2023-06-18 ENCOUNTER — Other Ambulatory Visit: Payer: Self-pay

## 2023-06-18 VITALS — BP 130/88 | Ht 60.0 in | Wt 185.0 lb

## 2023-06-18 DIAGNOSIS — M67432 Ganglion, left wrist: Secondary | ICD-10-CM

## 2023-06-18 DIAGNOSIS — M25512 Pain in left shoulder: Secondary | ICD-10-CM | POA: Diagnosis not present

## 2023-06-18 DIAGNOSIS — M25532 Pain in left wrist: Secondary | ICD-10-CM

## 2023-06-18 MED ORDER — METHYLPREDNISOLONE ACETATE 40 MG/ML IJ SUSP
40.0000 mg | Freq: Once | INTRAMUSCULAR | Status: AC
  Administered 2023-06-18: 40 mg via INTRA_ARTICULAR

## 2023-06-18 NOTE — Patient Instructions (Signed)
We are sending you to Dr. Dion Saucier to discuss options for your left shoulder. [facility] [address] [place]  [phone]  Appt: Friday 06/25/23 @ 10:30 am, please arrive at 10:15 am for check-in.

## 2023-06-18 NOTE — Progress Notes (Signed)
  Janice French - 42 y.o. female MRN [id number]  Date of birth: 29-Apr-1981    SUBJECTIVE:      Chief Complaint:/ HPI Left shoulder pain: Has been ongoing for quite a while.  Is at the point where she would like to see orthopedic surgeon to consider surgical intervention. 2.  Left wrist ganglion: Has a lot of wrist pain that she thinks is related to this.  Would like to have an evaluation to address this.   OBJECTIVE: BP 130/88   Ht 5' (1.524 m)   Wt 185 lb (83.9 kg)   LMP 05/20/2023 (Approximate)   BMI 36.13 kg/m   Physical Exam:  Vital signs are reviewed. general: Well-developed female no acute distress SHOULDER: Left: Pain with abduction above 90 degrees and with supraspinatus testing.  Strength is 5 out of 5 in all planes the rotator cuff however. WRIST: Small nodule on the extensor tendon of the left wrist. ULTRASOUND: Ultrasound exam of the left wrist reveals a hypoechoic well-circumscribed lesion that seems to communicate with the radial ulnar joint.  PROCEDURE: Patient given informed consent, signed copy in the chart.  Area prepped and draped in usual sterile fashion.  1 cc of lidocaine 1% was used for local anesthesia.  Then , the cyst was punctured using an 18-gauge needle.  Very small amount of viscous fluid was withdrawn.  Needle was withdrawn.  Then a combination of 1 cc of lidocaine mixed with 1 cc of methylprednisolone 40 mg, 0.5 cc of this mixture was injected into the cyst itself.  Patient tolerated procedure well.  ASSESSMENT & PLAN:  See problem based charting & AVS for pt instructions. No problem-specific Assessment & Plan notes found for this encounter. Chronic left shoulder pain: She would like to see orthopedic surgeon for further evaluation and I will set that up. 2.  Ganglion cyst: Aspiration and injection today.  If it is not improving after 3 to 4 weeks, she will return to clinic.

## 2023-07-02 DIAGNOSIS — M542 Cervicalgia: Secondary | ICD-10-CM | POA: Diagnosis not present

## 2023-07-02 DIAGNOSIS — M25512 Pain in left shoulder: Secondary | ICD-10-CM | POA: Diagnosis not present

## 2023-08-13 DIAGNOSIS — M67432 Ganglion, left wrist: Secondary | ICD-10-CM | POA: Diagnosis not present

## 2023-08-13 DIAGNOSIS — M25512 Pain in left shoulder: Secondary | ICD-10-CM | POA: Diagnosis not present

## 2023-08-18 ENCOUNTER — Ambulatory Visit (HOSPITAL_COMMUNITY): Payer: BC Managed Care – PPO

## 2023-08-18 ENCOUNTER — Ambulatory Visit: Payer: BC Managed Care – PPO

## 2023-08-20 DIAGNOSIS — G8929 Other chronic pain: Secondary | ICD-10-CM | POA: Diagnosis not present

## 2023-08-20 DIAGNOSIS — M25512 Pain in left shoulder: Secondary | ICD-10-CM | POA: Diagnosis not present

## 2023-08-20 DIAGNOSIS — M7582 Other shoulder lesions, left shoulder: Secondary | ICD-10-CM | POA: Diagnosis not present

## 2023-08-20 DIAGNOSIS — M129 Arthropathy, unspecified: Secondary | ICD-10-CM | POA: Diagnosis not present

## 2023-09-24 DIAGNOSIS — M25512 Pain in left shoulder: Secondary | ICD-10-CM | POA: Diagnosis not present

## 2023-09-24 DIAGNOSIS — M67432 Ganglion, left wrist: Secondary | ICD-10-CM | POA: Diagnosis not present

## 2023-09-24 DIAGNOSIS — M25511 Pain in right shoulder: Secondary | ICD-10-CM | POA: Diagnosis not present

## 2023-09-29 ENCOUNTER — Telehealth: Payer: Self-pay | Admitting: Pulmonary Disease

## 2023-09-29 NOTE — Telephone Encounter (Signed)
Fax received from Dr. Teryl Lucy  with [facility] to perform a left ganglion cyst removal on patient under choice anesthesia.  Patient needs surgery clearance. Surgery is TBD. Patient was seen on 01/18/23. Office protocol is a risk assessment can be sent to surgeon if patient has been seen in 60 days or less.   Sending to Dr. Francine Graven for risk assessment or recommendations if patient needs to be seen in office prior to surgical procedure.  Pt not due back until Feb 2025- are you able to do risk assessment based off of the last visit?

## 2023-09-30 NOTE — Progress Notes (Signed)
Fax received from [facility] on 09/28/23 for medical clearance/medication hold for left ganglion cyst to be signed by B. Randie Heinz, MD.  Provider signed on 09/29/23, scanned into pt's chart, media routed via MyChart to sender on 09/30/23.

## 2023-10-13 NOTE — Telephone Encounter (Signed)
ARISCAT Score for Postoperative Pulmonary Complications  Low risk 1.6% risk of in-hospital post-op pulmonary complications (composite including respiratory failure, respiratory infection, pleural effusion, atelectasis, pneumothorax, bronchospasm, aspiration pneumonitis)

## 2023-10-15 NOTE — Telephone Encounter (Signed)
Copy of this note was faxed to [facility].

## 2023-10-29 ENCOUNTER — Encounter: Payer: BC Managed Care – PPO | Admitting: Family Medicine

## 2023-11-01 DIAGNOSIS — M67432 Ganglion, left wrist: Secondary | ICD-10-CM | POA: Diagnosis not present

## 2023-11-01 DIAGNOSIS — R079 Chest pain, unspecified: Secondary | ICD-10-CM | POA: Diagnosis not present

## 2023-11-01 DIAGNOSIS — M25512 Pain in left shoulder: Secondary | ICD-10-CM | POA: Diagnosis not present

## 2023-11-01 DIAGNOSIS — R030 Elevated blood-pressure reading, without diagnosis of hypertension: Secondary | ICD-10-CM | POA: Diagnosis not present

## 2023-11-02 ENCOUNTER — Other Ambulatory Visit: Payer: Self-pay

## 2023-11-02 ENCOUNTER — Emergency Department (HOSPITAL_BASED_OUTPATIENT_CLINIC_OR_DEPARTMENT_OTHER)
Admission: EM | Admit: 2023-11-02 | Discharge: 2023-11-02 | Disposition: A | Discharge status: Home / Self Care | Payer: BC Managed Care – PPO | Attending: Emergency Medicine | Admitting: Emergency Medicine

## 2023-11-02 ENCOUNTER — Encounter (HOSPITAL_BASED_OUTPATIENT_CLINIC_OR_DEPARTMENT_OTHER): Payer: Self-pay | Admitting: Emergency Medicine

## 2023-11-02 DIAGNOSIS — S46912A Strain of unspecified muscle, fascia and tendon at shoulder and upper arm level, left arm, initial encounter: Secondary | ICD-10-CM | POA: Diagnosis not present

## 2023-11-02 DIAGNOSIS — Z7982 Long term (current) use of aspirin: Secondary | ICD-10-CM | POA: Diagnosis not present

## 2023-11-02 DIAGNOSIS — I10 Essential (primary) hypertension: Secondary | ICD-10-CM | POA: Diagnosis not present

## 2023-11-02 DIAGNOSIS — X58XXXA Exposure to other specified factors, initial encounter: Secondary | ICD-10-CM | POA: Insufficient documentation

## 2023-11-02 DIAGNOSIS — F17210 Nicotine dependence, cigarettes, uncomplicated: Secondary | ICD-10-CM | POA: Insufficient documentation

## 2023-11-02 DIAGNOSIS — S46012A Strain of muscle(s) and tendon(s) of the rotator cuff of left shoulder, initial encounter: Secondary | ICD-10-CM | POA: Diagnosis not present

## 2023-11-02 DIAGNOSIS — S4992XA Unspecified injury of left shoulder and upper arm, initial encounter: Secondary | ICD-10-CM | POA: Diagnosis not present

## 2023-11-02 DIAGNOSIS — Z79899 Other long term (current) drug therapy: Secondary | ICD-10-CM | POA: Insufficient documentation

## 2023-11-02 DIAGNOSIS — Z7902 Long term (current) use of antithrombotics/antiplatelets: Secondary | ICD-10-CM | POA: Insufficient documentation

## 2023-11-02 MED ORDER — ACETAMINOPHEN 500 MG PO TABS
1000.0000 mg | ORAL_TABLET | Freq: Once | ORAL | Status: AC
  Administered 2023-11-02: 1000 mg via ORAL
  Filled 2023-11-02: qty 2

## 2023-11-02 MED ORDER — NAPROXEN 375 MG PO TABS: 375.0000 mg | ORAL_TABLET | Freq: Two times a day (BID) | ORAL | 0 refills | Status: DC

## 2023-11-02 MED ORDER — CYCLOBENZAPRINE HCL 10 MG PO TABS: 5.0000 mg | ORAL_TABLET | Freq: Every day | ORAL | 0 refills | Status: AC

## 2023-11-02 MED ORDER — KETOROLAC TROMETHAMINE 60 MG/2ML IM SOLN
30.0000 mg | Freq: Once | INTRAMUSCULAR | Status: AC
  Administered 2023-11-02: 30 mg via INTRAMUSCULAR
  Filled 2023-11-02: qty 2

## 2023-11-02 NOTE — ED Triage Notes (Signed)
Pt states last Thursday started Chest pain, Monday unable to move left arm. Went to [facility] and was told to come to ED.

## 2023-11-02 NOTE — ED Notes (Signed)
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 Increased pain with any movement of the left arm

## 2023-11-02 NOTE — Discharge Instructions (Addendum)
For pain control you may take 1000 mg of Tylenol every 8 hours scheduled.  In addition you can take 1 tablet of Naproxen every 12 hours as needed for pain not controlled with the scheduled Tylenol.  You may also use topical muscle creams such as SalonPas, [employer], Bengay, etc. Please stretch, apply ice or heat (whichever helps), and have massage therapy for additional assistance.

## 2023-11-02 NOTE — ED Provider Notes (Signed)
[place] EMERGENCY DEPARTMENT AT [facility] Provider Note  CSN: [id number] Arrival date & time: 11/02/23 0405  Chief Complaint(s) Arm Pain  HPI Janice French is a 42 y.o. female     Arm Pain This is a new problem. The current episode started yesterday. The problem occurs constantly. The problem has not changed since onset.Associated symptoms include chest pain (left upper chest/shoulder region). Pertinent negatives include no abdominal pain, no headaches and no shortness of breath. Exacerbated by: movement, ROM of left shoulder & neck, and palpation of the shoulder girdle region. Relieved by: immobility.   No fall or trauma. Patient woke up with pain yesterday morning. Went to [facility] and had negative shoulder xray.  Past Medical History Past Medical History:  Diagnosis Date   Acute ankle pain 06/10/2018   Capsulitis 11/28/2019   Carpal tunnel syndrome of right wrist 02/27/2020   Dysmenorrhea    Encounter for orthopedic follow-up care 05/01/2021   Essential hypertension 11/28/2019   Foot pain 06/10/2018   Ganglion cyst of dorsum of left wrist 11/28/2019   History of left heart catheterization 05/19/2022   Hypertension    Other chest pain 05/19/2022   Pain in elbow 11/28/2019   Trigger thumb of right hand 03/31/2021   Patient Active Problem List   Diagnosis Date Noted   Dysmenorrhea 10/12/2022   Hypertension 10/12/2022   Presence of internal carotid stent 10/12/2022   Cigarette smoker 10/12/2022   Obesity (BMI 30.0-34.9) 10/12/2022   Mixed dyslipidemia 10/12/2022   History of left heart catheterization 05/19/2022   Other chest pain 05/19/2022   Encounter for orthopedic follow-up care 05/01/2021   Trigger thumb of right hand 03/31/2021   Carpal tunnel syndrome of right wrist 02/27/2020   Capsulitis 11/28/2019   Pain in elbow 11/28/2019   Ganglion cyst of dorsum of left wrist 11/28/2019   Essential hypertension 11/28/2019   Foot pain 06/10/2018    Acute ankle pain 06/10/2018   Home Medication(s) Prior to Admission medications   Medication Sig Start Date End Date Taking? Authorizing Provider  cyclobenzaprine (FLEXERIL) 10 MG tablet Take 0.5-1 tablets (5-10 mg total) by mouth at bedtime for 10 days. 11/02/23 11/12/23 Yes Daxton Nydam, Amadeo Garnet, MD  naproxen (NAPROSYN) 375 MG tablet Take 1 tablet (375 mg total) by mouth 2 (two) times daily. 11/02/23  Yes Oni Dietzman, Amadeo Garnet, MD  acetaminophen (TYLENOL) 500 MG tablet Take 1 tablet (500 mg total) by mouth every 6 (six) hours as needed. 01/30/23   Fayrene Helper, PA-C  albuterol (VENTOLIN HFA) 108 (90 Base) MCG/ACT inhaler Inhale 2 puffs into the lungs every 6 (six) hours as needed for wheezing or shortness of breath. 01/29/23   Martina Sinner, MD  amLODipine (NORVASC) 10 MG tablet Take 1 tablet (10 mg total) by mouth daily. 03/03/22   Smoot, Shawn Route, PA-C  ASPIRIN LOW DOSE 81 MG tablet Take 81 mg by mouth daily. 03/31/22   [provider]  benzonatate (TESSALON) 100 MG capsule Take 1 capsule (100 mg total) by mouth every 8 (eight) hours. 01/30/23   Fayrene Helper, PA-C  Capsaicin-Menthol-Methyl Sal (CAPSAICIN-METHYL SAL-MENTHOL) 0.025-1-12 % CREA Apply 1 application topically every 6 (six) hours as needed. Rubbed onto your abdomen every 6 hours if needed for pain and nausea 06/23/19   Arby Barrette, MD  clopidogrel (PLAVIX) 75 MG tablet Take 1 tablet (75 mg total) by mouth daily. 12/28/22 12/28/23  Maeola Harman, MD  fluticasone-salmeterol (ADVAIR [facility]) [phone] MCG/ACT inhaler Inhale 2 puffs into the lungs  2 (two) times daily. 10/29/22   Martina Sinner, MD  hydrochlorothiazide (HYDRODIURIL) 25 MG tablet Take 1 tablet (25 mg total) by mouth daily. 03/04/22   Mayers, Cari S, PA-C  metoprolol succinate (TOPROL-XL) 25 MG 24 hr tablet Take 25 mg by mouth daily. 07/08/22   [provider]  nitroGLYCERIN (NITROSTAT) 0.4 MG SL tablet PLACE 1 TABLET UNDER THE TONGUE EVERY 5 MINUTES AS  NEEDED 03/23/23   Revankar, Aundra Dubin, MD  omeprazole (PRILOSEC) 20 MG capsule Take 1 capsule (20 mg total) by mouth daily. 03/04/22   Mayers, Cari S, PA-C  rosuvastatin (CRESTOR) 10 MG tablet Take 1 tablet (10 mg total) by mouth daily. 02/04/21 02/04/22  Horton, Mayer Masker, MD  promethazine (PHENERGAN) 25 MG tablet Take 1 tablet (25 mg total) by mouth every 6 (six) hours as needed for nausea or vomiting. 06/23/19 08/06/20  Arby Barrette, MD                                                                                                                                    Allergies Patient has no known allergies.  Review of Systems Review of Systems  Respiratory:  Negative for shortness of breath.   Cardiovascular:  Positive for chest pain (left upper chest/shoulder region).  Gastrointestinal:  Negative for abdominal pain.  Neurological:  Negative for headaches.   As noted in HPI  Physical Exam Vital Signs  I have reviewed the triage vital signs BP (!) 121/115 (BP Location: Right Arm)   Pulse 86   Temp 98.4 F (36.9 C) (Oral)   Resp 18   Ht 5' (1.524 m)   Wt 81.6 kg   LMP 10/31/2023   SpO2 100%   BMI 35.15 kg/m   Physical Exam Vitals reviewed.  Constitutional:      General: She is not in acute distress.    Appearance: She is well-developed. She is not diaphoretic.  HENT:     Head: Normocephalic and atraumatic.     Right Ear: External ear normal.     Left Ear: External ear normal.     Nose: Nose normal.  Eyes:     General: No scleral icterus.    Conjunctiva/sclera: Conjunctivae normal.  Neck:     Trachea: Phonation normal.  Cardiovascular:     Rate and Rhythm: Normal rate and regular rhythm.  Pulmonary:     Effort: Pulmonary effort is normal. No respiratory distress.     Breath sounds: No stridor.  Chest:     Chest wall: Tenderness present.    Abdominal:     General: There is no distension.  Musculoskeletal:     Left shoulder: Tenderness present. No swelling,  deformity, laceration or bony tenderness. Decreased range of motion. Normal strength. Normal pulse.     Right hand: Normal pulse.     Left hand: Normal strength. Normal sensation. Normal pulse.     Cervical back: Normal  range of motion. Spasms and tenderness present. No bony tenderness.     Thoracic back: Spasms and tenderness present. No bony tenderness.       Back:  Neurological:     Mental Status: She is alert and oriented to person, place, and time.  Psychiatric:        Behavior: Behavior normal.     ED Results and Treatments Labs (all labs ordered are listed, but only abnormal results are displayed) Labs Reviewed - No data to display                                                                                                                       EKG  EKG Interpretation Date/Time:    Ventricular Rate:    PR Interval:    QRS Duration:    QT Interval:    QTC Calculation:   R Axis:      Text Interpretation:         Radiology No results found.  Medications Ordered in ED Medications  ketorolac (TORADOL) injection 30 mg (30 mg Intramuscular Given 11/02/23 0452)  acetaminophen (TYLENOL) tablet 1,000 mg (1,000 mg Oral Given 11/02/23 0451)   Procedures Procedures  (including critical care time) Medical Decision Making / ED Course   Medical Decision Making Risk OTC drugs. Prescription drug management.    Left shoulder girdle region pain. Consistent with muscle strain/spasm of the musculature in the region. NVI distally. Doubt traumatic injury. No need to repeat xray. Not concerning for ACS, PE, PTx, PNA, etc. Sling for comfort. IM toradol and Tylenol PO given Supportive/symptomatic management recommended.     Final Clinical Impression(s) / ED Diagnoses Final diagnoses:  Muscle strain of left shoulder region, initial encounter   The patient appears reasonably screened and/or stabilized for discharge and I doubt any other medical condition or other [facility]  requiring further screening, evaluation, or treatment in the ED at this time. I have discussed the findings, Dx and Tx plan with the patient/family who expressed understanding and agree(s) with the plan. Discharge instructions discussed at length. The patient/family was given strict return precautions who verbalized understanding of the instructions. No further questions at time of discharge.  Disposition: Discharge  Condition: Good  ED Discharge Orders          Ordered    naproxen (NAPROSYN) 375 MG tablet  2 times daily        11/02/23 0452    cyclobenzaprine (FLEXERIL) 10 MG tablet  Daily at bedtime        11/02/23 0452             Follow Up: Georganna Skeans, MD [address] [place] [address] [phone]  Call  to schedule an appointment for close follow up    This chart was dictated using voice recognition software.  Despite best efforts to proofread,  errors can occur which can change the documentation meaning.    Nira Conn, MD 11/02/23 [phone]

## 2023-11-15 DIAGNOSIS — M67432 Ganglion, left wrist: Secondary | ICD-10-CM | POA: Diagnosis not present

## 2023-11-17 ENCOUNTER — Encounter (HOSPITAL_BASED_OUTPATIENT_CLINIC_OR_DEPARTMENT_OTHER): Payer: Self-pay | Admitting: Emergency Medicine

## 2023-11-17 ENCOUNTER — Other Ambulatory Visit: Payer: Self-pay

## 2023-11-17 ENCOUNTER — Emergency Department (HOSPITAL_BASED_OUTPATIENT_CLINIC_OR_DEPARTMENT_OTHER)
Admission: EM | Admit: 2023-11-17 | Discharge: 2023-11-18 | Disposition: A | Discharge status: Home / Self Care | Payer: BC Managed Care – PPO | Attending: Emergency Medicine | Admitting: Emergency Medicine

## 2023-11-17 ENCOUNTER — Emergency Department (HOSPITAL_BASED_OUTPATIENT_CLINIC_OR_DEPARTMENT_OTHER): Payer: BC Managed Care – PPO

## 2023-11-17 DIAGNOSIS — R0602 Shortness of breath: Secondary | ICD-10-CM | POA: Insufficient documentation

## 2023-11-17 DIAGNOSIS — J45909 Unspecified asthma, uncomplicated: Secondary | ICD-10-CM | POA: Diagnosis not present

## 2023-11-17 DIAGNOSIS — Z79899 Other long term (current) drug therapy: Secondary | ICD-10-CM | POA: Diagnosis not present

## 2023-11-17 DIAGNOSIS — R079 Chest pain, unspecified: Secondary | ICD-10-CM | POA: Diagnosis not present

## 2023-11-17 DIAGNOSIS — Z7902 Long term (current) use of antithrombotics/antiplatelets: Secondary | ICD-10-CM | POA: Diagnosis not present

## 2023-11-17 DIAGNOSIS — R0789 Other chest pain: Secondary | ICD-10-CM

## 2023-11-17 DIAGNOSIS — I10 Essential (primary) hypertension: Secondary | ICD-10-CM | POA: Insufficient documentation

## 2023-11-17 DIAGNOSIS — Z7951 Long term (current) use of inhaled steroids: Secondary | ICD-10-CM | POA: Diagnosis not present

## 2023-11-17 DIAGNOSIS — R918 Other nonspecific abnormal finding of lung field: Secondary | ICD-10-CM | POA: Diagnosis not present

## 2023-11-17 DIAGNOSIS — Z7982 Long term (current) use of aspirin: Secondary | ICD-10-CM | POA: Diagnosis not present

## 2023-11-17 LAB — CBC WITH DIFFERENTIAL/PLATELET
Abs Immature Granulocytes: 0.01 10*3/uL (ref 0.00–0.07)
Basophils Absolute: 0.1 10*3/uL (ref 0.0–0.1)
Basophils Relative: 1 %
Eosinophils Absolute: 0.2 10*3/uL (ref 0.0–0.5)
Eosinophils Relative: 2 %
HCT: 36.6 % (ref 36.0–46.0)
Hemoglobin: 12.3 g/dL (ref 12.0–15.0)
Immature Granulocytes: 0 %
Lymphocytes Relative: 63 %
Lymphs Abs: 5.4 10*3/uL — ABNORMAL HIGH (ref 0.7–4.0)
MCH: 31 pg (ref 26.0–34.0)
MCHC: 33.6 g/dL (ref 30.0–36.0)
MCV: 92.2 fL (ref 80.0–100.0)
Monocytes Absolute: 0.7 10*3/uL (ref 0.1–1.0)
Monocytes Relative: 8 %
Neutro Abs: 2.2 10*3/uL (ref 1.7–7.7)
Neutrophils Relative %: 26 %
Platelets: 278 10*3/uL (ref 150–400)
RBC: 3.97 MIL/uL (ref 3.87–5.11)
RDW: 14.9 % (ref 11.5–15.5)
Smear Review: NORMAL
WBC Morphology: ABNORMAL
WBC: 8.7 10*3/uL (ref 4.0–10.5)
nRBC: 0 % (ref 0.0–0.2)

## 2023-11-17 LAB — BASIC METABOLIC PANEL
Anion gap: 8 (ref 5–15)
BUN: 16 mg/dL (ref 6–20)
CO2: 23 mmol/L (ref 22–32)
Calcium: 9.1 mg/dL (ref 8.9–10.3)
Chloride: 104 mmol/L (ref 98–111)
Creatinine, Ser: 0.85 mg/dL (ref 0.44–1.00)
GFR, Estimated: >60 mL/min (ref >60)
Glucose, Bld: 98 mg/dL (ref 70–99)
Potassium: 4.2 mmol/L (ref 3.5–5.1)
Sodium: 135 mmol/L (ref 135–145)

## 2023-11-17 LAB — PREGNANCY, URINE: Preg Test, Ur: NEGATIVE

## 2023-11-17 LAB — TROPONIN I (HIGH SENSITIVITY): Troponin I (High Sensitivity): 4 ng/L (ref <18)

## 2023-11-17 MED ORDER — KETOROLAC TROMETHAMINE 60 MG/2ML IM SOLN
60.0000 mg | Freq: Once | INTRAMUSCULAR | Status: AC
  Administered 2023-11-17: 60 mg via INTRAMUSCULAR
  Filled 2023-11-17: qty 2

## 2023-11-17 MED ORDER — HYDROCODONE-ACETAMINOPHEN 5-325 MG PO TABS
2.0000 | ORAL_TABLET | Freq: Once | ORAL | Status: AC
  Administered 2023-11-17: 2 via ORAL
  Filled 2023-11-17: qty 2

## 2023-11-17 NOTE — ED Triage Notes (Signed)
Pt reports sharp middle chest pain and SHOB since yesterday; NAD at this time

## 2023-11-17 NOTE — ED Provider Notes (Signed)
[facility] Provider Note   CSN: [id number] Arrival date & time: 11/17/23  2206     History {Add pertinent medical, surgical, social history, OB history to HPI:1} Chief Complaint  Patient presents with   Chest Pain   Shortness of Breath    Janice French is a 42 y.o. female.  Patient is a 42 year old female with past medical history of asthma, hypertension, hyperlipidemia.  Patient presenting today for evaluation of chest pain.  This started yesterday in the absence of any injury or trauma.  She describes a sharp pain to the center of her chest that is worse when she exhales, moves, or changes position.  She denies any fevers, chills, or cough.  She denies shortness of breath.  No alleviating factors.  The history is provided by the patient.       Home Medications Prior to Admission medications   Medication Sig Start Date End Date Taking? Authorizing Provider  acetaminophen (TYLENOL) 500 MG tablet Take 1 tablet (500 mg total) by mouth every 6 (six) hours as needed. 01/30/23   Fayrene Helper, PA-C  albuterol (VENTOLIN HFA) 108 (90 Base) MCG/ACT inhaler Inhale 2 puffs into the lungs every 6 (six) hours as needed for wheezing or shortness of breath. 01/29/23   Martina Sinner, MD  amLODipine (NORVASC) 10 MG tablet Take 1 tablet (10 mg total) by mouth daily. 03/03/22   Smoot, Shawn Route, PA-C  ASPIRIN LOW DOSE 81 MG tablet Take 81 mg by mouth daily. 03/31/22   [provider]  benzonatate (TESSALON) 100 MG capsule Take 1 capsule (100 mg total) by mouth every 8 (eight) hours. 01/30/23   Fayrene Helper, PA-C  Capsaicin-Menthol-Methyl Sal (CAPSAICIN-METHYL SAL-MENTHOL) 0.025-1-12 % CREA Apply 1 application topically every 6 (six) hours as needed. Rubbed onto your abdomen every 6 hours if needed for pain and nausea 06/23/19   Arby Barrette, MD  clopidogrel (PLAVIX) 75 MG tablet Take 1 tablet (75 mg total) by mouth daily. 12/28/22 12/28/23  Maeola Harman, MD  fluticasone-salmeterol (ADVAIR HFA) [phone] MCG/ACT inhaler Inhale 2 puffs into the lungs 2 (two) times daily. 10/29/22   Martina Sinner, MD  hydrochlorothiazide (HYDRODIURIL) 25 MG tablet Take 1 tablet (25 mg total) by mouth daily. 03/04/22   Mayers, Cari S, PA-C  metoprolol succinate (TOPROL-XL) 25 MG 24 hr tablet Take 25 mg by mouth daily. 07/08/22   [provider]  naproxen (NAPROSYN) 375 MG tablet Take 1 tablet (375 mg total) by mouth 2 (two) times daily. 11/02/23   Cardama, Amadeo Garnet, MD  nitroGLYCERIN (NITROSTAT) 0.4 MG SL tablet PLACE 1 TABLET UNDER THE TONGUE EVERY 5 MINUTES AS NEEDED 03/23/23   Revankar, Aundra Dubin, MD  omeprazole (PRILOSEC) 20 MG capsule Take 1 capsule (20 mg total) by mouth daily. 03/04/22   Mayers, Cari S, PA-C  rosuvastatin (CRESTOR) 10 MG tablet Take 1 tablet (10 mg total) by mouth daily. 02/04/21 02/04/22  Horton, Mayer Masker, MD  promethazine (PHENERGAN) 25 MG tablet Take 1 tablet (25 mg total) by mouth every 6 (six) hours as needed for nausea or vomiting. 06/23/19 08/06/20  Arby Barrette, MD      Allergies    Patient has no known allergies.    Review of Systems   Review of Systems  All other systems reviewed and are negative.   Physical Exam Updated Vital Signs BP (!) 187/115 (BP Location: Right Arm)   Pulse 99   Temp 97.7 F (36.5  C)   Resp 20   Ht 5' (1.524 m)   Wt 81.6 kg   LMP 10/31/2023   SpO2 100%   BMI 35.15 kg/m  Physical Exam Vitals and nursing note reviewed.  Constitutional:      General: She is not in acute distress.    Appearance: She is well-developed. She is not diaphoretic.  HENT:     Head: Normocephalic and atraumatic.  Cardiovascular:     Rate and Rhythm: Normal rate and regular rhythm.     Heart sounds: No murmur heard.    No friction rub. No gallop.  Pulmonary:     Effort: Pulmonary effort is normal. No respiratory distress.     Breath sounds: Normal breath sounds. No wheezing.      Comments: There is tenderness to palpation of the anterior chest wall. Abdominal:     General: Bowel sounds are normal. There is no distension.     Palpations: Abdomen is soft.     Tenderness: There is no abdominal tenderness.  Musculoskeletal:        General: Normal range of motion.     Cervical back: Normal range of motion and neck supple.     Right lower leg: No tenderness. No edema.     Left lower leg: No tenderness. No edema.  Skin:    General: Skin is warm and dry.  Neurological:     General: No focal deficit present.     Mental Status: She is alert and oriented to person, place, and time.     ED Results / Procedures / Treatments   Labs (all labs ordered are listed, but only abnormal results are displayed) Labs Reviewed  CBC WITH DIFFERENTIAL/PLATELET - Abnormal; Notable for the following components:      Result Value   Lymphs Abs 5.4 (*)    All other components within normal limits  BASIC METABOLIC PANEL  PREGNANCY, URINE  TROPONIN I (HIGH SENSITIVITY)    EKG  ED ECG REPORT   Date: 11/17/2023  Rate: 97  Rhythm: normal sinus rhythm  QRS Axis: normal  Intervals: normal  ST/T Wave abnormalities: normal  Conduction Disutrbances:none  Narrative Interpretation:   Old EKG Reviewed: none available  I have personally reviewed the EKG tracing and agree with the computerized printout as noted.    Radiology DG Chest 2 View Result Date: 11/17/2023 CLINICAL DATA:  Chest pain. EXAM: CHEST - 2 VIEW COMPARISON:  June 01, 2023 FINDINGS: The heart size and mediastinal contours are within normal limits. Mild, stable linear scarring and/or atelectasis is seen within the left lung base. No pleural effusion or pneumothorax is identified. Radiopaque surgical clips are seen within the right upper quadrant. The visualized skeletal structures are unremarkable. IMPRESSION: Mild, stable left basilar linear scarring and/or atelectasis. Electronically Signed   By: Aram Candela M.D.    On: 11/17/2023 22:45    Procedures Procedures  {Document cardiac monitor, telemetry assessment procedure when appropriate:1}  Medications Ordered in ED Medications - No data to display  ED Course/ Medical Decision Making/ A&P   {   Click here for ABCD2, HEART and other calculatorsREFRESH Note before signing :1}                              Medical Decision Making Amount and/or Complexity of Data Reviewed Labs: ordered. Radiology: ordered.  Risk Prescription drug management.   ***  {Document critical care time when appropriate:1} {Document review  of labs and clinical decision tools ie heart score, Chads2Vasc2 etc:1}  {Document your independent review of radiology images, and any outside records:1} {Document your discussion with family members, caretakers, and with consultants:1} {Document social determinants of health affecting pt's care:1} {Document your decision making why or why not admission, treatments were needed:1} Final Clinical Impression(s) / ED Diagnoses Final diagnoses:  None    Rx / DC Orders ED Discharge Orders     None

## 2023-11-18 LAB — TROPONIN I (HIGH SENSITIVITY): Troponin I (High Sensitivity): 4 ng/L (ref <18)

## 2023-11-18 MED ORDER — HYDROCODONE-ACETAMINOPHEN 5-325 MG PO TABS: 1.0000 | ORAL_TABLET | Freq: Four times a day (QID) | ORAL | 0 refills | Status: DC | PRN

## 2023-11-18 MED ORDER — NAPROXEN 500 MG PO TABS: 500.0000 mg | ORAL_TABLET | Freq: Two times a day (BID) | ORAL | 0 refills | Status: DC

## 2023-11-18 NOTE — Discharge Instructions (Signed)
Begin taking naproxen as prescribed.  Begin taking hydrocodone as prescribed as needed for pain not relieved with naproxen.  Rest.  Follow-up with primary doctor if symptoms are not improving in the next few days, and return to the ER if symptoms significantly worsen or change.

## 2023-11-19 ENCOUNTER — Telehealth: Payer: Self-pay

## 2023-11-19 NOTE — Transitions of Care (Post Inpatient/ED Visit) (Unsigned)
   11/19/2023  Name: Janice French MRN: [id number] DOB: October 02, 1981  Today's TOC FU Call Status: Today's TOC FU Call Status:: Unsuccessful Call (1st Attempt) Unsuccessful Call (1st Attempt) Date: 11/19/23  Attempted to reach the patient regarding the most recent Inpatient/ED visit.  Follow Up Plan: Additional outreach attempts will be made to reach the patient to complete the Transitions of Care (Post Inpatient/ED visit) call.   Abby Khristi Schiller, CMA  [facility] Team Direct Dial: [phone]

## 2023-11-22 ENCOUNTER — Telehealth: Payer: Self-pay

## 2023-11-22 NOTE — Telephone Encounter (Signed)
Pt called to let us know [facility] faxed [place] a clearance for her to hold Plavix for ganglion cyst removal. MD approved clearance today and this was faxed back to [facility] today.

## 2023-11-25 NOTE — Transitions of Care (Post Inpatient/ED Visit) (Signed)
   11/25/2023  Name: Janice French MRN: [id number] DOB: [birth year]  Today's TOC FU Call Status: Today's TOC FU Call Status:: Unsuccessful Call (2nd Attempt) Unsuccessful Call (1st Attempt) Date: 11/19/23 Unsuccessful Call (2nd Attempt) Date: 11/25/23  Attempted to reach the patient regarding the most recent Inpatient/ED visit.  Follow Up Plan: Additional outreach attempts will be made to reach the patient to complete the Transitions of Care (Post Inpatient/ED visit) call.   Marykay Mccleod, CMA  [facility] Team Direct Dial: [phone]

## 2023-11-30 ENCOUNTER — Encounter (HOSPITAL_BASED_OUTPATIENT_CLINIC_OR_DEPARTMENT_OTHER): Payer: Self-pay | Admitting: Orthopedic Surgery

## 2023-11-30 ENCOUNTER — Other Ambulatory Visit: Payer: Self-pay

## 2023-11-30 NOTE — Progress Notes (Signed)
   11/30/23 1504  PAT Phone Screen  Is the patient taking a GLP-1 receptor agonist? No  Do You Have Diabetes? No  Do You Have Hypertension? (S)  Yes (Consistenly elevated BP's at OV and ED visits since 05/17/23 167/10/-187/115.)  Have You Ever Been to the ER for Asthma? No  Have You Taken Oral Steroids in the Past 3 Months? No  Do you Take Phenteramine or any Other Diet Drugs? No  Recent  Lab Work, EKG, CXR? (S)  Yes  Where was this test performed? seen in ER 11/17/23 for non- cardiac related CP. EKG NSR, CMET and CBC done  Do you have a history of heart problems? Yes  Cardiologist Name followed by Vascular surg for h/o PAD, Stents in iliac artery  Have you ever had tests on your heart? Yes  What cardiac tests were performed? Cardiac Cath;Echo;Stress Test  What date/year were cardiac tests completed? 05/12/22 Stess test- result equivical. 04/23/22 Holter study WNL, 05/19/22 negative cath  Results viewable: [facility] Media Tab;Care Everywhere  Any Recent Hospitalizations? No  Height 5' (1.524 m)  Weight 81.6 kg  Pat Appointment Scheduled No  Reason for No Appointment Not Needed   Pt's medical history, elevated BP's reviewed with Dr Mallory. Ok to proceed w/ sx at [facility] as planned. Per Dr Mallory pt to take all bp meds dos including hydrochlorothiazide . Confirmed with Kirke at Dr Bertie office that pt does not need to hold Plavix  or aspirin . Pre op call completed w/ pt and above directions explained. Pt to abstain from vaping x 24hrs prior to procedure.Pt verbalized understanding.

## 2023-12-08 ENCOUNTER — Other Ambulatory Visit: Payer: Self-pay

## 2023-12-08 ENCOUNTER — Ambulatory Visit (HOSPITAL_COMMUNITY): Payer: BC Managed Care – PPO

## 2023-12-08 ENCOUNTER — Ambulatory Visit (HOSPITAL_BASED_OUTPATIENT_CLINIC_OR_DEPARTMENT_OTHER)
Admission: RE | Admit: 2023-12-08 | Discharge: 2023-12-08 | Disposition: A | Discharge status: Home / Self Care | Payer: BC Managed Care – PPO | Attending: Orthopedic Surgery | Admitting: Orthopedic Surgery

## 2023-12-08 ENCOUNTER — Encounter (HOSPITAL_BASED_OUTPATIENT_CLINIC_OR_DEPARTMENT_OTHER)
Admission: RE | Disposition: A | Discharge status: Home / Self Care | Payer: Self-pay | Source: Home / Self Care | Attending: Orthopedic Surgery

## 2023-12-08 ENCOUNTER — Encounter (HOSPITAL_BASED_OUTPATIENT_CLINIC_OR_DEPARTMENT_OTHER): Payer: Self-pay | Admitting: Orthopedic Surgery

## 2023-12-08 ENCOUNTER — Ambulatory Visit (HOSPITAL_BASED_OUTPATIENT_CLINIC_OR_DEPARTMENT_OTHER): Payer: BC Managed Care – PPO | Admitting: Anesthesiology

## 2023-12-08 ENCOUNTER — Ambulatory Visit: Payer: BC Managed Care – PPO

## 2023-12-08 DIAGNOSIS — F17290 Nicotine dependence, other tobacco product, uncomplicated: Secondary | ICD-10-CM | POA: Diagnosis not present

## 2023-12-08 DIAGNOSIS — J45909 Unspecified asthma, uncomplicated: Secondary | ICD-10-CM | POA: Insufficient documentation

## 2023-12-08 DIAGNOSIS — I739 Peripheral vascular disease, unspecified: Secondary | ICD-10-CM | POA: Insufficient documentation

## 2023-12-08 DIAGNOSIS — M67432 Ganglion, left wrist: Secondary | ICD-10-CM | POA: Diagnosis not present

## 2023-12-08 DIAGNOSIS — I10 Essential (primary) hypertension: Secondary | ICD-10-CM | POA: Insufficient documentation

## 2023-12-08 DIAGNOSIS — Z01818 Encounter for other preprocedural examination: Secondary | ICD-10-CM

## 2023-12-08 HISTORY — PX: CYST EXCISION: SHX5701

## 2023-12-08 HISTORY — DX: Unspecified asthma, uncomplicated: J45.909

## 2023-12-08 HISTORY — DX: Peripheral vascular disease, unspecified: I73.9

## 2023-12-08 LAB — POCT PREGNANCY, URINE: Preg Test, Ur: NEGATIVE

## 2023-12-08 SURGERY — CYST REMOVAL
Anesthesia: Monitor Anesthesia Care | Site: Wrist | Laterality: Left

## 2023-12-08 MED ORDER — FENTANYL CITRATE (PF) 100 MCG/2ML IJ SOLN
INTRAMUSCULAR | Status: DC | PRN
  Administered 2023-12-08 (×2): 50 ug via INTRAVENOUS

## 2023-12-08 MED ORDER — CEFAZOLIN SODIUM-DEXTROSE 2-4 GM/100ML-% IV SOLN
INTRAVENOUS | Status: AC
  Filled 2023-12-08: qty 100

## 2023-12-08 MED ORDER — PROPOFOL 10 MG/ML IV BOLUS
INTRAVENOUS | Status: DC | PRN
  Administered 2023-12-08: 20 mg via INTRAVENOUS
  Administered 2023-12-08: 30 mg via INTRAVENOUS

## 2023-12-08 MED ORDER — LACTATED RINGERS IV SOLN: INTRAVENOUS | Status: DC

## 2023-12-08 MED ORDER — 0.9 % SODIUM CHLORIDE (POUR BTL) OPTIME
TOPICAL | Status: DC | PRN
Start: 2023-12-08 — End: 2023-12-08
  Administered 2023-12-08: 300 mL

## 2023-12-08 MED ORDER — KETOROLAC TROMETHAMINE 30 MG/ML IJ SOLN
INTRAMUSCULAR | Status: AC
  Filled 2023-12-08: qty 1

## 2023-12-08 MED ORDER — DROPERIDOL 2.5 MG/ML IJ SOLN: 0.6250 mg | Freq: Once | INTRAMUSCULAR | Status: DC | PRN

## 2023-12-08 MED ORDER — KETOROLAC TROMETHAMINE 30 MG/ML IJ SOLN
INTRAMUSCULAR | Status: DC | PRN
  Administered 2023-12-08: 30 mg via INTRAVENOUS

## 2023-12-08 MED ORDER — PROPOFOL 500 MG/50ML IV EMUL
INTRAVENOUS | Status: DC | PRN
  Administered 2023-12-08: 100 ug/kg/min via INTRAVENOUS

## 2023-12-08 MED ORDER — MIDAZOLAM HCL 5 MG/5ML IJ SOLN
INTRAMUSCULAR | Status: DC | PRN
  Administered 2023-12-08: 2 mg via INTRAVENOUS

## 2023-12-08 MED ORDER — OXYCODONE HCL 5 MG PO TABS: 5.0000 mg | ORAL_TABLET | Freq: Four times a day (QID) | ORAL | 0 refills | Status: AC | PRN

## 2023-12-08 MED ORDER — FENTANYL CITRATE (PF) 100 MCG/2ML IJ SOLN
25.0000 ug | INTRAMUSCULAR | Status: DC | PRN
Start: 2023-12-08 — End: 2023-12-08
  Administered 2023-12-08: 50 ug via INTRAVENOUS

## 2023-12-08 MED ORDER — OXYCODONE HCL 5 MG PO TABS
ORAL_TABLET | ORAL | Status: AC
  Filled 2023-12-08: qty 1

## 2023-12-08 MED ORDER — ONDANSETRON HCL 4 MG/2ML IJ SOLN
INTRAMUSCULAR | Status: AC
  Filled 2023-12-08: qty 2

## 2023-12-08 MED ORDER — FENTANYL CITRATE (PF) 100 MCG/2ML IJ SOLN
INTRAMUSCULAR | Status: AC
  Filled 2023-12-08: qty 2

## 2023-12-08 MED ORDER — ACETAMINOPHEN 500 MG PO TABS: 1000.0000 mg | ORAL_TABLET | Freq: Once | ORAL | Status: DC

## 2023-12-08 MED ORDER — SODIUM CHLORIDE 0.9 % IV SOLN: INTRAVENOUS | Status: DC | PRN

## 2023-12-08 MED ORDER — OXYCODONE HCL 5 MG PO TABS
5.0000 mg | ORAL_TABLET | Freq: Once | ORAL | Status: AC | PRN
  Administered 2023-12-08: 5 mg via ORAL

## 2023-12-08 MED ORDER — OXYCODONE HCL 5 MG/5ML PO SOLN: 5.0000 mg | Freq: Once | ORAL | Status: AC | PRN

## 2023-12-08 MED ORDER — ONDANSETRON HCL 4 MG/2ML IJ SOLN
INTRAMUSCULAR | Status: DC | PRN
  Administered 2023-12-08: 4 mg via INTRAVENOUS

## 2023-12-08 MED ORDER — LIDOCAINE HCL (CARDIAC) PF 100 MG/5ML IV SOSY
PREFILLED_SYRINGE | INTRAVENOUS | Status: DC | PRN
  Administered 2023-12-08: 60 mg via INTRAVENOUS

## 2023-12-08 MED ORDER — CEFAZOLIN SODIUM-DEXTROSE 2-4 GM/100ML-% IV SOLN
2.0000 g | INTRAVENOUS | Status: AC
  Administered 2023-12-08: 2 g via INTRAVENOUS

## 2023-12-08 MED ORDER — MIDAZOLAM HCL 2 MG/2ML IJ SOLN
INTRAMUSCULAR | Status: AC
Start: 2023-12-08 — End: ?
  Filled 2023-12-08: qty 2

## 2023-12-08 MED ORDER — LIDOCAINE HCL (PF) 1 % IJ SOLN
INTRAMUSCULAR | Status: DC | PRN
  Administered 2023-12-08: 10 mL via SURGICAL_CAVITY

## 2023-12-08 MED ORDER — PROPOFOL 500 MG/50ML IV EMUL
INTRAVENOUS | Status: AC
  Filled 2023-12-08: qty 50

## 2023-12-08 SURGICAL SUPPLY — 32 items
BLADE SURG 15 STRL LF DISP TIS (BLADE) ×1 IMPLANT
BNDG COHESIVE 1X5 TAN STRL LF (GAUZE/BANDAGES/DRESSINGS) IMPLANT
BNDG ELASTIC 3INX 5YD STR LF (GAUZE/BANDAGES/DRESSINGS) IMPLANT
BNDG ESMARK 4X9 LF (GAUZE/BANDAGES/DRESSINGS) IMPLANT
BNDG GAUZE DERMACEA FLUFF 4 (GAUZE/BANDAGES/DRESSINGS) IMPLANT
CHLORAPREP W/TINT 26 (MISCELLANEOUS) ×1 IMPLANT
CORD BIPOLAR FORCEPS 12FT (ELECTRODE) ×1 IMPLANT
COVER BACK TABLE 60X90IN (DRAPES) ×1 IMPLANT
CUFF TOURN SGL QUICK 18X4 (TOURNIQUET CUFF) ×1 IMPLANT
CUFF TRNQT CYL 24X4X16.5-23 (TOURNIQUET CUFF) IMPLANT
DRAPE EXTREMITY T 121X128X90 (DISPOSABLE) ×1 IMPLANT
DRAPE SURG 17X23 STRL (DRAPES) ×1 IMPLANT
GAUZE SPONGE 4X4 12PLY STRL (GAUZE/BANDAGES/DRESSINGS) IMPLANT
GAUZE XEROFORM 1X8 LF (GAUZE/BANDAGES/DRESSINGS) ×1 IMPLANT
GLOVE BIO SURGEON STRL SZ7 (GLOVE) ×1 IMPLANT
GLOVE BIOGEL PI IND STRL 7.0 (GLOVE) ×1 IMPLANT
GOWN STRL REUS W/ TWL LRG LVL3 (GOWN DISPOSABLE) ×2 IMPLANT
NDL HYPO 25X1 1.5 SAFETY (NEEDLE) IMPLANT
NEEDLE HYPO 25X1 1.5 SAFETY (NEEDLE)
NS IRRIG 1000ML POUR BTL (IV SOLUTION) ×1 IMPLANT
PACK BASIN DAY SURGERY FS (CUSTOM PROCEDURE TRAY) ×1 IMPLANT
PAD CAST 3X4 CTTN HI CHSV (CAST SUPPLIES) IMPLANT
SHEET MEDIUM DRAPE 40X70 STRL (DRAPES) ×1 IMPLANT
SLEEVE SCD COMPRESS KNEE MED (STOCKING) IMPLANT
SPLINT FIBERGLASS 4X30 (CAST SUPPLIES) IMPLANT
SUT ETHILON 4 0 PS 2 18 (SUTURE) ×1 IMPLANT
SUT MNCRL AB 3-0 PS2 18 (SUTURE) IMPLANT
SUT VIC AB 4-0 PS2 18 (SUTURE) IMPLANT
SYR BULB EAR ULCER 3OZ GRN STR (SYRINGE) ×1 IMPLANT
SYR CONTROL 10ML LL (SYRINGE) ×1 IMPLANT
TOWEL GREEN STERILE FF (TOWEL DISPOSABLE) ×2 IMPLANT
UNDERPAD 30X36 HEAVY ABSORB (UNDERPADS AND DIAPERS) ×1 IMPLANT

## 2023-12-08 NOTE — Anesthesia Postprocedure Evaluation (Signed)
 Anesthesia Post Note  Patient: Janice French  Procedure(s) Performed: CARPAL GANGLION CYST EXCISION (Left: Wrist)     Patient location during evaluation: PACU Anesthesia Type: MAC Level of consciousness: awake and alert Pain management: pain level controlled Vital Signs Assessment: post-procedure vital signs reviewed and stable Respiratory status: spontaneous breathing, nonlabored ventilation and respiratory function stable Cardiovascular status: blood pressure returned to baseline Postop Assessment: no apparent nausea or vomiting Anesthetic complications: no   No notable events documented.  Last Vitals:  Vitals:   12/08/23 1230 12/08/23 1245  BP: (!) 160/108 (!) 157/94  Pulse: 80 84  Resp: 18 18  Temp:    SpO2: 94% 94%    Last Pain:  Vitals:   12/08/23 1245  TempSrc:   PainSc: 5                  Rayfield Cairo

## 2023-12-08 NOTE — Interval H&P Note (Signed)
 History and Physical Interval Note:  12/08/2023 11:11 AM  Janice French  has presented today for surgery, with the diagnosis of Left dorsal carpal ganglion cyst excision.  The various methods of treatment have been discussed with the patient and family. After consideration of risks, benefits and other options for treatment, the patient has consented to  Procedure(s) with comments: CARPAL GANGLION CYST EXCISION (Left) - local as a surgical intervention.  The patient's history has been reviewed, patient examined, no change in status, stable for surgery.  I have reviewed the patient's chart and labs.  Questions were answered to the patient's satisfaction.     Adonte Vanriper

## 2023-12-08 NOTE — Discharge Instructions (Addendum)
 Auburn Blaze, M.D. Hand Surgery  POST-OPERATIVE DISCHARGE INSTRUCTIONS   PRESCRIPTIONS: - You may have been given a prescription to be taken as directed for post-operative pain control.  You may also take over the counter ibuprofen /aleve  and tylenol  for pain. Take this as directed on the packaging. Do not exceed 3000 mg tylenol /acetaminophen  in 24 hours.  Ibuprofen  600-800 mg (3-4) tablets by mouth every 6 hours as needed for pain.   OR  Aleve  2 tablets by mouth every 12 hours (twice daily) as needed for pain.   AND/OR  Tylenol  1000 mg (2 tablets) every 8 hours as needed for pain.  - Please use your pain medication carefully, as refills are limited and you may not be provided with one.  As stated above, please use over the counter pain medicine - it will also be helpful with decreasing your swelling.    ANESTHESIA: -After your surgery, post-surgical discomfort or pain is likely. This discomfort can last several days to a few weeks. At certain times of the day your discomfort may be more intense.   Did you receive a nerve block?   - A nerve block can provide pain relief  for one hour to two days after your surgery. As long as the nerve block is working, you will experience little or no sensation in the area the surgeon operated on.  - As the nerve block wears off, you will begin to experience pain or discomfort. It is very important that you begin taking your prescribed pain medication before the nerve block fully wears off. Treating your pain at the first sign of the block wearing off will ensure your pain is better controlled and more tolerable when full-sensation returns. Do not wait until the pain is intolerable, as the medicine will be less effective. It is better to treat pain in advance than to try and catch up.   General Anesthesia:  If you did not receive a nerve block during your surgery, you will need to start taking your pain medication shortly after your surgery and  should continue to do so as prescribed by your surgeon.     ICE AND ELEVATION: - You may use ice for the first 48-72 hours, but it is not critical.   - Motion of your fingers is very important to decrease the swelling.  - Elevation, as much as possible for the next 48 hours, is critical for decreasing swelling as well as for pain relief . Elevation means when you are seated or lying down, you hand should be at or above your heart. When walking, the hand needs to be at or above the level of your elbow.  - If the bandage gets too tight, it may need to be loosened. Please contact our office and we will instruct you in how to do this.    SURGICAL BANDAGES:  - Keep your dressing and/or splint clean and dry at all times.  You can remove your dressing 7 days from now and change with a dry dressing or Band-Aids as needed thereafter. - You may place a plastic bag over your bandage to shower, but be careful, do not get your bandages wet.  - After the bandages have been removed, it is OK to get the stitches wet in a shower or with hand washing. Do Not soak or submerge the wound yet. Please do not use lotions or creams on the stitches.      HAND THERAPY:  - You may not need any. If you  do, we will begin this at your follow up visit in the clinic.    ACTIVITY AND WORK: - You are encouraged to move any fingers which are not in the bandage.  - Light use of the fingers is allowed to assist the other hand with daily hygiene and eating, but strong gripping or lifting is often uncomfortable and should be avoided.  - You might miss a variable period of time from work and hopefully this issue has been discussed prior to surgery. You may not do any heavy work with your affected hand for about 2 weeks.    [facility] Second Floor, [address] [place], [place] [address] [phone]    No ibuprofen  before 5:45pm   Post Anesthesia Home Care Instructions  Activity: Get plenty of rest for the  remainder of the day. A responsible individual must stay with you for 24 hours following the procedure.  For the next 24 hours, DO NOT: -Drive a car -Advertising copywriter -Drink alcoholic beverages -Take any medication unless instructed by your physician -Make any legal decisions or sign important papers.  Meals: Start with liquid foods such as gelatin or soup. Progress to regular foods as tolerated. Avoid greasy, spicy, heavy foods. If nausea and/or vomiting occur, drink only clear liquids until the nausea and/or vomiting subsides. Call your physician if vomiting continues.  Special Instructions/Symptoms: Your throat may feel dry or sore from the anesthesia or the breathing tube placed in your throat during surgery. If this causes discomfort, gargle with warm salt water. The discomfort should disappear within 24 hours.

## 2023-12-08 NOTE — Transfer of Care (Signed)
 Immediate Anesthesia Transfer of Care Note  Patient: Janice French  Procedure(s) Performed: CARPAL GANGLION CYST EXCISION (Left: Wrist)  Patient Location: PACU  Anesthesia Type:MAC  Level of Consciousness: awake, alert , and oriented  Airway & Oxygen Therapy: Patient Spontanous Breathing and Patient connected to face mask oxygen  Post-op Assessment: Report given to RN and Post -op Vital signs reviewed and stable  Post vital signs: Reviewed and stable  Last Vitals:  Vitals Value Taken Time  BP 146/89 12/08/23 1200  Temp    Pulse 91 12/08/23 1203  Resp 27 12/08/23 1203  SpO2 96 % 12/08/23 1203  Vitals shown include unfiled device data.  Last Pain:  Vitals:   12/08/23 1043  TempSrc: Temporal  PainSc: 0-No pain      Patients Stated Pain Goal: 8 (12/08/23 1043)  Complications: No notable events documented.

## 2023-12-08 NOTE — H&P (Signed)
 HAND SURGERY   HPI: Patient is a 43 y.o. female who presents with a left dorsal carpal ganglion cyst.  The cyst has become increasingly symptomatic.  She presents today for mass excision.  Patient denies any changes to their medical history or new systemic symptoms today.    Past Medical History:  Diagnosis Date   Acute ankle pain 06/10/2018   Asthma    Capsulitis 11/28/2019   Carpal tunnel syndrome of right wrist 02/27/2020   Dysmenorrhea    Encounter for orthopedic follow-up care 05/01/2021   Essential hypertension 11/28/2019   Foot pain 06/10/2018   Ganglion cyst of dorsum of left wrist 11/28/2019   History of left heart catheterization 05/19/2022   Hypertension    Other chest pain 05/19/2022   PAD (peripheral artery disease) (HCC)    Pain in elbow 11/28/2019   Trigger thumb of right hand 03/31/2021   Past Surgical History:  Procedure Laterality Date   ABDOMINAL AORTOGRAM W/LOWER EXTREMITY Bilateral 08/19/2020   Procedure: ABDOMINAL AORTOGRAM W/LOWER EXTREMITY;  Surgeon: Adine Hoof, MD;  Location: [facility] INVASIVE CV LAB;  Service: Cardiovascular;  Laterality: Bilateral;   ABDOMINAL AORTOGRAM W/LOWER EXTREMITY N/A 12/28/2022   Procedure: ABDOMINAL AORTOGRAM W/LOWER EXTREMITY;  Surgeon: Adine Hoof, MD;  Location: [facility] INVASIVE CV LAB;  Service: Cardiovascular;  Laterality: N/A;   CARDIAC CATHETERIZATION  05/19/2022   CHOLECYSTECTOMY     PERIPHERAL VASCULAR INTERVENTION Left 08/19/2020   Procedure: PERIPHERAL VASCULAR INTERVENTION;  Surgeon: Adine Hoof, MD;  Location: [facility] INVASIVE CV LAB;  Service: Cardiovascular;  Laterality: Left;  Common iliac   TUBAL LIGATION     Social History   Socioeconomic History   Marital status: Single    Spouse name: Not on file   Number of children: Not on file   Years of education: Not on file   Highest education level: Not on file  Occupational History   Not on file  Tobacco Use   Smoking status:  Some Days    Types: Cigars    Start date: 1995    Passive exposure: Current   Smokeless tobacco: Never   Tobacco comments:    Hx of vaping.  Pt started smoking at age 78.  Smokes [employer] and [employer]  Smokes about 5 cigars a day  Vaping Use   Vaping status: Former   Substances: Nicotine  Substance and Sexual Activity   Alcohol use: No   Drug use: Not Currently    Types: Marijuana    Comment: last used last week   Sexual activity: Never    Birth control/protection: Surgical  Other Topics Concern   Not on file  Social History Narrative   Not on file   Social Drivers of Health   Financial Resource Strain: Not on file  Food Insecurity: No Food Insecurity (03/31/2022)   Received from [facility], [facility]   Hunger Vital Sign    Worried About Running Out of Food in the Last Year: Never true    Ran Out of Food in the Last Year: Never true  Transportation Needs: Not on file  Physical Activity: Not on file  Stress: No Stress Concern Present (05/19/2022)   Received from [facility], [facility]   [employer] of Occupational Health - Occupational Stress Questionnaire    Feeling of Stress : Not at all  Social Connections: Unknown (03/27/2022)   Received from [facility], [facility]   Social Network    Social Network: Not on file  Family History  Problem Relation Age of Onset   Hypertension Other    Cancer Other    - negative except otherwise stated in the family history section No Known Allergies Prior to Admission medications   Medication Sig Start Date End Date Taking? Authorizing Provider  albuterol  (VENTOLIN  HFA) 108 (90 Base) MCG/ACT inhaler Inhale 2 puffs into the lungs every 6 (six) hours as needed for wheezing or shortness of breath. 01/29/23  Yes Wilfredo Hanly, MD  amLODipine  (NORVASC ) 10 MG tablet Take 1 tablet (10 mg total) by mouth daily. 03/03/22  Yes Smoot, Genevive Ket, PA-C  clopidogrel  (PLAVIX ) 75 MG tablet Take 1 tablet (75 mg total) by  mouth daily. 12/28/22 12/28/23 Yes Adine Hoof, MD  fluticasone -salmeterol (ADVAIR HFA) 115-21 MCG/ACT inhaler Inhale 2 puffs into the lungs 2 (two) times daily. 10/29/22  Yes Wilfredo Hanly, MD  hydrochlorothiazide  (HYDRODIURIL ) 25 MG tablet Take 1 tablet (25 mg total) by mouth daily. 03/04/22  Yes Mayers, Cari S, PA-C  metoprolol  succinate (TOPROL -XL) 25 MG 24 hr tablet Take 25 mg by mouth daily. 07/08/22  Yes [provider]  acetaminophen  (TYLENOL ) 500 MG tablet Take 1 tablet (500 mg total) by mouth every 6 (six) hours as needed. 01/30/23   Debbra Fairy, PA-C  ASPIRIN  LOW DOSE 81 MG tablet Take 81 mg by mouth daily. 03/31/22   [provider]  benzonatate  (TESSALON ) 100 MG capsule Take 1 capsule (100 mg total) by mouth every 8 (eight) hours. 01/30/23   Debbra Fairy, PA-C  Capsaicin-Menthol-Methyl Sal (CAPSAICIN-METHYL SAL-MENTHOL) 0.025-1-12 % CREA Apply 1 application topically every 6 (six) hours as needed. Rubbed onto your abdomen every 6 hours if needed for pain and nausea 06/23/19   Wynetta Heckle, MD  HYDROcodone -acetaminophen  (NORCO) 5-325 MG tablet Take 1-2 tablets by mouth every 6 (six) hours as needed. 11/18/23   Orvilla Blander, MD  naproxen  (NAPROSYN ) 500 MG tablet Take 1 tablet (500 mg total) by mouth 2 (two) times daily. 11/18/23   Orvilla Blander, MD  nitroGLYCERIN  (NITROSTAT ) 0.4 MG SL tablet PLACE 1 TABLET UNDER THE TONGUE EVERY 5 MINUTES AS NEEDED 03/23/23   Revankar, Micael Adas, MD  omeprazole  (PRILOSEC) 20 MG capsule Take 1 capsule (20 mg total) by mouth daily. Patient not taking: Reported on 11/30/2023 03/04/22   Mayers, Cari S, PA-C  rosuvastatin  (CRESTOR ) 10 MG tablet Take 1 tablet (10 mg total) by mouth daily. Patient not taking: Reported on 11/30/2023 02/04/21 02/04/22  Horton, Vonzella Guernsey, MD  promethazine  (PHENERGAN ) 25 MG tablet Take 1 tablet (25 mg total) by mouth every 6 (six) hours as needed for nausea or vomiting. 06/23/19 08/06/20  Wynetta Heckle, MD   No  results found. - Positive ROS: All other systems have been reviewed and were otherwise negative with the exception of those mentioned in the HPI and as above.  Physical Exam: General: No acute distress, resting comfortably Cardiovascular: BUE warm and well perfused, normal rate Respiratory: Normal WOB on RA Skin: Warm and dry Neurologic: Sensation intact distally Psychiatric: Patient is at baseline mood and affect  Left Upper Extremity  Firm, round, mobile, subcutaneous mass at the dorsal radial aspect of the wrist.  The mass is approximately 2 cm in diameter.  There is no surrounding erythema or induration.  She has full and painless AROM of the elbow, forearm, wrist, and fingers.  SILT m/u/r distribution.  Hand warm and well perfused w/ BCR.    Assessment: 43 year old female with left dorsal carpal  ganglion cyst  Plan: OR today for left dorsal carpal ganglion cyst excision. We again reviewed the risks of surgery which include bleeding, infection, damage to neurovascular structures, persistent symptoms, delayed wound healing, need for additional surgery.  Informed consent was signed.  All questions were answered.   Marilyn Shropshire, M.D. [facility] 11:07 AM

## 2023-12-08 NOTE — Anesthesia Preprocedure Evaluation (Addendum)
 Anesthesia Evaluation  Patient identified by MRN, date of birth, ID band Patient awake    Reviewed: Allergy & Precautions, NPO status , Patient's Chart, lab work & pertinent test results  History of Anesthesia Complications Negative for: history of anesthetic complications  Airway Mallampati: II  TM Distance: >3 FB Neck ROM: Full    Dental no notable dental hx.    Pulmonary asthma , Current Smoker   Pulmonary exam normal        Cardiovascular hypertension, + Peripheral Vascular Disease  Normal cardiovascular exam     Neuro/Psych negative neurological ROS     GI/Hepatic negative GI ROS, Neg liver ROS,,,  Endo/Other  negative endocrine ROS    Renal/GU negative Renal ROS     Musculoskeletal negative musculoskeletal ROS (+)    Abdominal   Peds  Hematology negative hematology ROS (+)   Anesthesia Other Findings Day of surgery medications reviewed with patient.  Reproductive/Obstetrics                             Anesthesia Physical Anesthesia Plan  ASA: 2  Anesthesia Plan: MAC   Post-op Pain Management: Tylenol  PO (pre-op)*   Induction:   PONV Risk Score and Plan: 1 and Treatment may vary due to age or medical condition, Propofol  infusion and Midazolam   Airway Management Planned: Natural Airway and Simple Face Mask  Additional Equipment: None  Intra-op Plan:   Post-operative Plan:   Informed Consent: I have reviewed the patients History and Physical, chart, labs and discussed the procedure including the risks, benefits and alternatives for the proposed anesthesia with the patient or authorized representative who has indicated his/her understanding and acceptance.     Dental advisory given  Plan Discussed with: CRNA  Anesthesia Plan Comments:        Anesthesia Quick Evaluation

## 2023-12-08 NOTE — Op Note (Signed)
   Date of Surgery: 12/08/2023  INDICATIONS: Patient is a 43 y.o.-year-old female with a left dorsal carpal ganglion cyst.  Risks, benefits, and alternatives to surgery were again discussed with the patient in the preoperative area. The patient wishes to proceed with surgery.  Informed consent was signed after our discussion.   PREOPERATIVE DIAGNOSIS:  Left dorsal carpal ganglion cyst  POSTOPERATIVE DIAGNOSIS: Same.  PROCEDURE:  Excision of left dorsal carpal ganglion cyst   SURGEON: Auburn Blaze, M.D.  ASSIST:   ANESTHESIA:  Local, MAC  IV FLUIDS AND URINE: See anesthesia.  ESTIMATED BLOOD LOSS: <5 mL.  IMPLANTS: * No implants in log *   DRAINS: None  COMPLICATIONS: None  DESCRIPTION OF PROCEDURE: The patient was met in the preoperative holding area where the surgical site was marked and the informed consent form was signed.  The patient was then brought back to the operating room and remained on the stretcher.  A hand table was placed adjacent to the operative extremity and locked into place.  A tourniquet was placed on the left forearm.  A formal timeout was performed to confirm that this was the correct patient, surgical side, surgical site, and surgical procedure.  All were present and in agreement. Following formal timeout, a local block was performed using 10 mL of an equal mixture of 1% plain lidocaine  and 0.25% plain marcaine .  The left upper extremity was then prepped and draped in the usual and sterile fashion.   Following a second formal timeout, the limb was gently exsanguinated with an Esmarch bandage and the tourniquet inflated to 250 mmHg.  I began by making a transverse incision directly over the mass.  The skin was incised.  Blunt dissection was used to develop full-thickness skin flaps.  The mass was visualized between the hand and third dorsal compartments.  The extensor tendons were retracted.  Blunt dissection was used to circumferentially free up the mass  from the surrounding soft tissue.  The mass was followed deep into the wound where was found to be arising from the capsule around the scapholunate interval.  Small crossing vessels were coagulated as needed with bipolar cautery.  The cyst was excised en bloc and included a small portion of the dorsal capsule to help prevent recurrence.  The wound was then thoroughly irrigated with copious sterile saline.  The skin was then closed using a 4-0 nylon suture in horizontal mattress fashion.  The tourniquet was deflated.  Hemostasis was achieved with direct pressure over the wound.  The wound was dressed with Xeroform, folded Kerlix, cast padding, and a well-padded volar splint was applied.  The patient was reversed from sedation.  All counts were correct x 2 at the end of the procedure.  The patient was then taken to the PACU in stable condition.   POSTOPERATIVE PLAN: She will be discharged to home with appropriate pain medication and discharge instructions.  I will see her back in 10 to 14 days for her first postop visit.  Auburn Blaze, MD 12:11 PM

## 2023-12-08 NOTE — Anesthesia Procedure Notes (Signed)
 Procedure Name: MAC Date/Time: 12/08/2023 11:20 AM  Performed by: Elvia Hammans, CRNAPre-anesthesia Checklist: Patient identified, Emergency Drugs available, Suction available, Patient being monitored and Timeout performed Patient Re-evaluated:Patient Re-evaluated prior to induction Oxygen Delivery Method: Simple face mask Placement Confirmation: positive ETCO2

## 2023-12-09 ENCOUNTER — Encounter (HOSPITAL_BASED_OUTPATIENT_CLINIC_OR_DEPARTMENT_OTHER): Payer: Self-pay | Admitting: Orthopedic Surgery

## 2023-12-09 LAB — SURGICAL PATHOLOGY

## 2023-12-10 ENCOUNTER — Other Ambulatory Visit: Payer: Self-pay | Admitting: Vascular Surgery

## 2023-12-23 ENCOUNTER — Ambulatory Visit (INDEPENDENT_AMBULATORY_CARE_PROVIDER_SITE_OTHER): Payer: BC Managed Care – PPO | Admitting: Family Medicine

## 2023-12-23 ENCOUNTER — Encounter: Payer: Self-pay | Admitting: Family Medicine

## 2023-12-23 VITALS — BP 163/96 | HR 78 | Temp 98.4°F | Resp 16 | Ht 60.0 in | Wt 184.0 lb

## 2023-12-23 DIAGNOSIS — Z130 Encounter for screening for diseases of the blood and blood-forming organs and certain disorders involving the immune mechanism: Secondary | ICD-10-CM

## 2023-12-23 DIAGNOSIS — I10 Essential (primary) hypertension: Secondary | ICD-10-CM | POA: Diagnosis not present

## 2023-12-23 DIAGNOSIS — Z0000 Encounter for general adult medical examination without abnormal findings: Secondary | ICD-10-CM | POA: Diagnosis not present

## 2023-12-23 DIAGNOSIS — Z13220 Encounter for screening for lipoid disorders: Secondary | ICD-10-CM

## 2023-12-24 ENCOUNTER — Encounter: Payer: Self-pay | Admitting: Family Medicine

## 2023-12-24 DIAGNOSIS — I10 Essential (primary) hypertension: Secondary | ICD-10-CM | POA: Diagnosis not present

## 2023-12-24 LAB — CMP14+EGFR
ALT: 15 [IU]/L (ref 0–32)
AST: 15 [IU]/L (ref 0–40)
Albumin: 4.1 g/dL (ref 3.9–4.9)
Alkaline Phosphatase: 150 [IU]/L — ABNORMAL HIGH (ref 44–121)
BUN/Creatinine Ratio: 9 (ref 9–23)
BUN: 7 mg/dL (ref 6–24)
Bilirubin Total: <0.2 mg/dL (ref 0.0–1.2)
CO2: 25 mmol/L (ref 20–29)
Calcium: 9.5 mg/dL (ref 8.7–10.2)
Chloride: 99 mmol/L (ref 96–106)
Creatinine, Ser: 0.8 mg/dL (ref 0.57–1.00)
Globulin, Total: 3.5 g/dL (ref 1.5–4.5)
Glucose: 92 mg/dL (ref 70–99)
Potassium: 3.9 mmol/L (ref 3.5–5.2)
Sodium: 139 mmol/L (ref 134–144)
Total Protein: 7.6 g/dL (ref 6.0–8.5)
eGFR: 94 mL/min/{1.73_m2} (ref >59)

## 2023-12-24 LAB — LIPID PANEL
Chol/HDL Ratio: 4 {ratio} (ref 0.0–4.4)
Cholesterol, Total: 190 mg/dL (ref 100–199)
HDL: 48 mg/dL (ref >39)
LDL Chol Calc (NIH): 128 mg/dL — ABNORMAL HIGH (ref 0–99)
Triglycerides: 75 mg/dL (ref 0–149)
VLDL Cholesterol Cal: 14 mg/dL (ref 5–40)

## 2023-12-24 LAB — CBC WITH DIFFERENTIAL/PLATELET
Basophils Absolute: 0 10*3/uL (ref 0.0–0.2)
Basos: 1 %
EOS (ABSOLUTE): 0.2 10*3/uL (ref 0.0–0.4)
Eos: 2 %
Hematocrit: 43.1 % (ref 34.0–46.6)
Hemoglobin: 14.6 g/dL (ref 11.1–15.9)
Immature Grans (Abs): 0 10*3/uL (ref 0.0–0.1)
Immature Granulocytes: 0 %
Lymphocytes Absolute: 4 10*3/uL — ABNORMAL HIGH (ref 0.7–3.1)
Lymphs: 53 %
MCH: 31.5 pg (ref 26.6–33.0)
MCHC: 33.9 g/dL (ref 31.5–35.7)
MCV: 93 fL (ref 79–97)
Monocytes Absolute: 0.6 10*3/uL (ref 0.1–0.9)
Monocytes: 8 %
Neutrophils Absolute: 2.6 10*3/uL (ref 1.4–7.0)
Neutrophils: 36 %
Platelets: 363 10*3/uL (ref 150–450)
RBC: 4.64 x10E6/uL (ref 3.77–5.28)
RDW: 13.6 % (ref 11.7–15.4)
WBC: 7.4 10*3/uL (ref 3.4–10.8)

## 2023-12-24 NOTE — Progress Notes (Unsigned)
Established Patient Office Visit  Subjective    Patient ID: Janice French, female    DOB: Apr 06, 1981  Age: 43 y.o. MRN: [id number]  CC:  Chief Complaint  Patient presents with   Annual Exam    HPI Janice French presents for routine annual exam. Patient denies acute complaints.   Outpatient Encounter Medications as of 12/23/2023  Medication Sig   acetaminophen (TYLENOL) 500 MG tablet Take 1 tablet (500 mg total) by mouth every 6 (six) hours as needed.   albuterol (VENTOLIN HFA) 108 (90 Base) MCG/ACT inhaler Inhale 2 puffs into the lungs every 6 (six) hours as needed for wheezing or shortness of breath.   amLODipine (NORVASC) 10 MG tablet Take 1 tablet (10 mg total) by mouth daily.   ASPIRIN LOW DOSE 81 MG tablet Take 81 mg by mouth daily.   benzonatate (TESSALON) 100 MG capsule Take 1 capsule (100 mg total) by mouth every 8 (eight) hours.   Capsaicin-Menthol-Methyl Sal (CAPSAICIN-METHYL SAL-MENTHOL) 0.025-1-12 % CREA Apply 1 application topically every 6 (six) hours as needed. Rubbed onto your abdomen every 6 hours if needed for pain and nausea   clopidogrel (PLAVIX) 75 MG tablet TAKE 1 TABLET BY MOUTH EVERY DAY   fluticasone-salmeterol (ADVAIR HFA) 115-21 MCG/ACT inhaler Inhale 2 puffs into the lungs 2 (two) times daily.   hydrochlorothiazide (HYDRODIURIL) 25 MG tablet Take 1 tablet (25 mg total) by mouth daily.   metoprolol succinate (TOPROL-XL) 25 MG 24 hr tablet Take 25 mg by mouth daily.   naproxen (NAPROSYN) 500 MG tablet Take 1 tablet (500 mg total) by mouth 2 (two) times daily.   nitroGLYCERIN (NITROSTAT) 0.4 MG SL tablet PLACE 1 TABLET UNDER THE TONGUE EVERY 5 MINUTES AS NEEDED   omeprazole (PRILOSEC) 20 MG capsule Take 1 capsule (20 mg total) by mouth daily.   rosuvastatin (CRESTOR) 10 MG tablet Take 1 tablet (10 mg total) by mouth daily. (Patient not taking: Reported on 11/30/2023)   [DISCONTINUED] promethazine (PHENERGAN) 25 MG tablet Take 1 tablet (25 mg total) by mouth  every 6 (six) hours as needed for nausea or vomiting.   No facility-administered encounter medications on file as of 12/23/2023.    Past Medical History:  Diagnosis Date   Acute ankle pain 06/10/2018   Asthma    Capsulitis 11/28/2019   Carpal tunnel syndrome of right wrist 02/27/2020   Dysmenorrhea    Encounter for orthopedic follow-up care 05/01/2021   Essential hypertension 11/28/2019   Foot pain 06/10/2018   Ganglion cyst of dorsum of left wrist 11/28/2019   History of left heart catheterization 05/19/2022   Hypertension    Other chest pain 05/19/2022   PAD (peripheral artery disease) (HCC)    Pain in elbow 11/28/2019   Trigger thumb of right hand 03/31/2021    Past Surgical History:  Procedure Laterality Date   ABDOMINAL AORTOGRAM W/LOWER EXTREMITY Bilateral 08/19/2020   Procedure: ABDOMINAL AORTOGRAM W/LOWER EXTREMITY;  Surgeon: Maeola Harman, MD;  Location: [facility] INVASIVE CV LAB;  Service: Cardiovascular;  Laterality: Bilateral;   ABDOMINAL AORTOGRAM W/LOWER EXTREMITY N/A 12/28/2022   Procedure: ABDOMINAL AORTOGRAM W/LOWER EXTREMITY;  Surgeon: Maeola Harman, MD;  Location: [facility] INVASIVE CV LAB;  Service: Cardiovascular;  Laterality: N/A;   CARDIAC CATHETERIZATION  05/19/2022   CHOLECYSTECTOMY     CYST EXCISION Left 12/08/2023   Procedure: CARPAL GANGLION CYST EXCISION;  Surgeon: Marlyne Beards, MD;  Location: [facility];  Service: Orthopedics;  Laterality: Left;  local   PERIPHERAL VASCULAR  INTERVENTION Left 08/19/2020   Procedure: PERIPHERAL VASCULAR INTERVENTION;  Surgeon: Maeola Harman, MD;  Location: [facility] INVASIVE CV LAB;  Service: Cardiovascular;  Laterality: Left;  Common iliac   TUBAL LIGATION      Family History  Problem Relation Age of Onset   Hypertension Other    Cancer Other     Social History   Socioeconomic History   Marital status: Single    Spouse name: Not on file   Number of children: Not on file    Years of education: Not on file   Highest education level: Not on file  Occupational History   Not on file  Tobacco Use   Smoking status: Some Days    Types: Cigars    Start date: 2    Passive exposure: Current   Smokeless tobacco: Never   Tobacco comments:    Hx of vaping.  Pt started smoking at age 43.  Smokes Black and [employer]  Smokes about 5 cigars a day  Vaping Use   Vaping status: Former   Substances: Nicotine  Substance and Sexual Activity   Alcohol use: No   Drug use: Not Currently    Types: Marijuana    Comment: last used last week   Sexual activity: Never    Birth control/protection: Surgical  Other Topics Concern   Not on file  Social History Narrative   Not on file   Social Drivers of Health   Financial Resource Strain: Low Risk  (12/23/2023)   Overall Financial Resource Strain (CARDIA)    Difficulty of Paying Living Expenses: Not hard at all  Food Insecurity: No Food Insecurity (12/23/2023)   Hunger Vital Sign    Worried About Running Out of Food in the Last Year: Never true    Ran Out of Food in the Last Year: Never true  Transportation Needs: No Transportation Needs (12/23/2023)   PRAPARE - Administrator, Civil Service (Medical): No    Lack of Transportation (Non-Medical): No  Physical Activity: Sufficiently Active (12/23/2023)   Exercise Vital Sign    Days of Exercise per Week: 7 days    Minutes of Exercise per Session: 30 min  Stress: No Stress Concern Present (12/23/2023)   [employer] of Occupational Health - Occupational Stress Questionnaire    Feeling of Stress : Not at all  Social Connections: Socially Isolated (12/23/2023)   Social Connection and Isolation Panel [NHANES]    Frequency of Communication with Friends and Family: More than three times a week    Frequency of Social Gatherings with Friends and Family: More than three times a week    Attends Religious Services: Never    Database administrator or Organizations: No     Attends Banker Meetings: Never    Marital Status: Never married  Intimate Partner Violence: Not At Risk (12/23/2023)   Humiliation, Afraid, Rape, and Kick questionnaire    Fear of Current or Ex-Partner: No    Emotionally Abused: No    Physically Abused: No    Sexually Abused: No    Review of Systems  All other systems reviewed and are negative.       Objective    BP (!) 163/96 (BP Location: Right Arm, Patient Position: Sitting, Cuff Size: Normal)   Pulse 78   Temp 98.4 F (36.9 C) (Oral)   Resp 16   Ht 5' (1.524 m)   Wt 184 lb (83.5 kg)   LMP 10/31/2023 (Exact Date)  Comment: UPT neg DOS  SpO2 98%   BMI 35.94 kg/m   Physical Exam Vitals and nursing note reviewed.  Constitutional:      General: She is not in acute distress.    Appearance: She is obese.  HENT:     Head: Normocephalic and atraumatic.     Right Ear: Tympanic membrane, ear canal and external ear normal.     Left Ear: Tympanic membrane, ear canal and external ear normal.     Nose: Nose normal.     Mouth/Throat:     Mouth: Mucous membranes are moist.     Pharynx: Oropharynx is clear.  Eyes:     Conjunctiva/sclera: Conjunctivae normal.     Pupils: Pupils are equal, round, and reactive to light.  Neck:     Thyroid: No thyromegaly.  Cardiovascular:     Rate and Rhythm: Normal rate and regular rhythm.     Heart sounds: Normal heart sounds. No murmur heard. Pulmonary:     Effort: Pulmonary effort is normal. No respiratory distress.     Breath sounds: Normal breath sounds.  Abdominal:     General: There is no distension.     Palpations: Abdomen is soft. There is no mass.     Tenderness: There is no abdominal tenderness.  Musculoskeletal:        General: Normal range of motion.     Cervical back: Normal range of motion and neck supple.  Skin:    General: Skin is warm and dry.  Neurological:     General: No focal deficit present.     Mental Status: She is alert and oriented to  person, place, and time.  Psychiatric:        Mood and Affect: Mood normal.        Behavior: Behavior normal.     {Labs (Optional):23779}    Assessment & Plan:   Annual physical exam -     CMP14+EGFR  Screening for deficiency anemia -     CBC with Differential/Platelet  Screening for lipid disorders -     Lipid panel  Uncontrolled hypertension     Return in about 2 weeks (around 01/06/2024) for follow up.   Tommie Raymond, MD

## 2023-12-27 ENCOUNTER — Encounter: Payer: Self-pay | Admitting: Family Medicine

## 2023-12-27 ENCOUNTER — Ambulatory Visit (INDEPENDENT_AMBULATORY_CARE_PROVIDER_SITE_OTHER): Payer: BC Managed Care – PPO | Admitting: Family Medicine

## 2023-12-27 VITALS — BP 140/93 | HR 102 | Temp 98.9°F | Resp 16 | Ht 60.0 in | Wt 184.6 lb

## 2023-12-27 DIAGNOSIS — I10 Essential (primary) hypertension: Secondary | ICD-10-CM

## 2023-12-27 DIAGNOSIS — N951 Menopausal and female climacteric states: Secondary | ICD-10-CM | POA: Diagnosis not present

## 2023-12-27 DIAGNOSIS — E66812 Obesity, class 2: Secondary | ICD-10-CM | POA: Diagnosis not present

## 2023-12-27 DIAGNOSIS — Z6836 Body mass index (BMI) 36.0-36.9, adult: Secondary | ICD-10-CM

## 2023-12-27 DIAGNOSIS — F17200 Nicotine dependence, unspecified, uncomplicated: Secondary | ICD-10-CM

## 2023-12-27 MED ORDER — LISINOPRIL 10 MG PO TABS: 10.0000 mg | ORAL_TABLET | Freq: Every day | ORAL | 0 refills | Status: AC

## 2023-12-29 ENCOUNTER — Encounter: Payer: Self-pay | Admitting: Family Medicine

## 2024-01-18 ENCOUNTER — Ambulatory Visit: Payer: BC Managed Care – PPO | Admitting: Pulmonary Disease

## 2024-01-24 ENCOUNTER — Telehealth: Payer: Self-pay | Admitting: Family Medicine

## 2024-01-24 NOTE — Telephone Encounter (Signed)
 A document form has been faxed: Surgical Clearance, to be filled out by provider. Send document back via Fax within 5-days. Document is located in providers tray at front office.           Fax number:  [phone]

## 2024-01-25 ENCOUNTER — Ambulatory Visit: Payer: BC Managed Care – PPO | Admitting: Family Medicine

## 2024-01-25 NOTE — Telephone Encounter (Signed)
 Patient cancel appointment today so MD can not feel out paperwork until she comes in for a appointment.  Front Desk called and made patient aware.

## 2024-01-25 NOTE — Telephone Encounter (Signed)
 I have paperwork on my desk for MD to fill out today at appointment.  Will fax back as soon as she fills it out.

## 2024-02-11 ENCOUNTER — Other Ambulatory Visit: Payer: Self-pay

## 2024-02-11 DIAGNOSIS — I739 Peripheral vascular disease, unspecified: Secondary | ICD-10-CM

## 2024-02-11 DIAGNOSIS — I771 Stricture of artery: Secondary | ICD-10-CM

## 2024-02-18 ENCOUNTER — Encounter: Payer: Self-pay | Admitting: Nurse Practitioner

## 2024-02-18 ENCOUNTER — Ambulatory Visit: Attending: Nurse Practitioner | Admitting: Nurse Practitioner

## 2024-02-18 VITALS — BP 131/83 | HR 80 | Resp 19 | Ht 60.0 in | Wt 181.0 lb

## 2024-02-18 DIAGNOSIS — I10 Essential (primary) hypertension: Secondary | ICD-10-CM

## 2024-02-18 DIAGNOSIS — M79675 Pain in left toe(s): Secondary | ICD-10-CM

## 2024-02-18 NOTE — Progress Notes (Signed)
 Assessment & Plan:  Janice French was seen today for blood pressure check.  Diagnoses and all orders for this visit:  Essential hypertension -     Basic Metabolic Panel Continue amlodipine and lisinopril as prescribed.  Reminded to bring in blood pressure log for follow  up appointment.  RECOMMENDATIONS: DASH/Mediterranean Diets are healthier choices for HTN.    Pain of toe of left foot -     Ambulatory referral to Podiatry    Patient has been counseled on age-appropriate routine health concerns for screening and prevention. These are reviewed and up-to-date. Referrals have been placed accordingly. Immunizations are up-to-date or declined.    Subjective:   Chief Complaint  Patient presents with   Blood Pressure Check    Janice French 43 y.o. female presents to office today for follow up to HTN She is a patient of Dr Andrey Campanile here to check blood pressure after being started on lisinopril several weeks ago.    Blood pressure is well controlled. She is taking lisinopril 10 mg daily as prescribed and amlodipine 10 mg daily. She notes urge incontinence since she started taking lisinopril. Does not occur every day. Declines incontinence pads order to insurance company today. States she can buy them herself if needed. She does continue to smoke cigars (black and milds) and has a history of vaping.  BP Readings from Last 3 Encounters:  02/18/24 131/83  12/27/23 (!) 140/93  12/23/23 (!) 163/96    She has left great toe pain. Nail is cut irregularly. I am unable to express any purulent fluid from the area and there is no erythema noted.   Review of Systems  Constitutional:  Negative for fever, malaise/fatigue and weight loss.  HENT: Negative.  Negative for nosebleeds.   Eyes: Negative.  Negative for blurred vision, double vision and photophobia.  Respiratory: Negative.  Negative for cough and shortness of breath.   Cardiovascular: Negative.  Negative for chest pain, palpitations and leg  swelling.  Gastrointestinal: Negative.  Negative for heartburn, nausea and vomiting.  Genitourinary:        SEE HPI  Musculoskeletal: Negative.  Negative for myalgias.  Neurological: Negative.  Negative for dizziness, focal weakness, seizures and headaches.  Psychiatric/Behavioral: Negative.  Negative for suicidal ideas.     Past Medical History:  Diagnosis Date   Acute ankle pain 06/10/2018   Asthma    Capsulitis 11/28/2019   Carpal tunnel syndrome of right wrist 02/27/2020   Dysmenorrhea    Encounter for orthopedic follow-up care 05/01/2021   Essential hypertension 11/28/2019   Foot pain 06/10/2018   Ganglion cyst of dorsum of left wrist 11/28/2019   History of left heart catheterization 05/19/2022   Hypertension    Other chest pain 05/19/2022   PAD (peripheral artery disease) (HCC)    Pain in elbow 11/28/2019   Trigger thumb of right hand 03/31/2021    Past Surgical History:  Procedure Laterality Date   ABDOMINAL AORTOGRAM W/LOWER EXTREMITY Bilateral 08/19/2020   Procedure: ABDOMINAL AORTOGRAM W/LOWER EXTREMITY;  Surgeon: Maeola Harman, MD;  Location: [facility] INVASIVE CV LAB;  Service: Cardiovascular;  Laterality: Bilateral;   ABDOMINAL AORTOGRAM W/LOWER EXTREMITY N/A 12/28/2022   Procedure: ABDOMINAL AORTOGRAM W/LOWER EXTREMITY;  Surgeon: Maeola Harman, MD;  Location: [facility] INVASIVE CV LAB;  Service: Cardiovascular;  Laterality: N/A;   CARDIAC CATHETERIZATION  05/19/2022   CHOLECYSTECTOMY     CYST EXCISION Left 12/08/2023   Procedure: CARPAL GANGLION CYST EXCISION;  Surgeon: Marlyne Beards, MD;  Location: [place]  [facility];  Service: Orthopedics;  Laterality: Left;  local   PERIPHERAL VASCULAR INTERVENTION Left 08/19/2020   Procedure: PERIPHERAL VASCULAR INTERVENTION;  Surgeon: Maeola Harman, MD;  Location: [facility] INVASIVE CV LAB;  Service: Cardiovascular;  Laterality: Left;  Common iliac   TUBAL LIGATION      Family History  Problem  Relation Age of Onset   Hypertension Other    Cancer Other     Social History Reviewed with no changes to be made today.   Outpatient Medications Prior to Visit  Medication Sig Dispense Refill   albuterol (VENTOLIN HFA) 108 (90 Base) MCG/ACT inhaler Inhale 2 puffs into the lungs every 6 (six) hours as needed for wheezing or shortness of breath. 8 g 6   amLODipine (NORVASC) 10 MG tablet Take 1 tablet (10 mg total) by mouth daily. 30 tablet 1   benzonatate (TESSALON) 100 MG capsule Take 1 capsule (100 mg total) by mouth every 8 (eight) hours. 21 capsule 0   Capsaicin-Menthol-Methyl Sal (CAPSAICIN-METHYL SAL-MENTHOL) 0.025-1-12 % CREA Apply 1 application topically every 6 (six) hours as needed. Rubbed onto your abdomen every 6 hours if needed for pain and nausea 56.6 g 1   clopidogrel (PLAVIX) 75 MG tablet TAKE 1 TABLET BY MOUTH EVERY DAY 90 tablet 3   fluticasone-salmeterol (ADVAIR HFA) 115-21 MCG/ACT inhaler Inhale 2 puffs into the lungs 2 (two) times daily. 1 each 12   hydrochlorothiazide (HYDRODIURIL) 25 MG tablet Take 1 tablet (25 mg total) by mouth daily. 30 tablet 1   lisinopril (ZESTRIL) 10 MG tablet Take 1 tablet (10 mg total) by mouth daily. 90 tablet 0   metoprolol succinate (TOPROL-XL) 25 MG 24 hr tablet Take 25 mg by mouth daily.     naproxen (NAPROSYN) 500 MG tablet Take 1 tablet (500 mg total) by mouth 2 (two) times daily. 20 tablet 0   rosuvastatin (CRESTOR) 10 MG tablet Take 1 tablet (10 mg total) by mouth daily. 30 tablet 11   acetaminophen (TYLENOL) 500 MG tablet Take 1 tablet (500 mg total) by mouth every 6 (six) hours as needed. (Patient not taking: Reported on 02/18/2024) 30 tablet 0   ASPIRIN LOW DOSE 81 MG tablet Take 81 mg by mouth daily. (Patient not taking: Reported on 02/18/2024)     nitroGLYCERIN (NITROSTAT) 0.4 MG SL tablet PLACE 1 TABLET UNDER THE TONGUE EVERY 5 MINUTES AS NEEDED (Patient not taking: Reported on 02/18/2024) 25 tablet 6   omeprazole (PRILOSEC) 20 MG  capsule Take 1 capsule (20 mg total) by mouth daily. (Patient not taking: Reported on 02/18/2024) 30 capsule 3   No facility-administered medications prior to visit.    No Known Allergies     Objective:    BP 131/83 (BP Location: Left Arm, Patient Position: Sitting, Cuff Size: Normal)   Pulse 80   Resp 19   Ht 5' (1.524 m)   Wt 181 lb (82.1 kg)   SpO2 100%   BMI 35.35 kg/m  Wt Readings from Last 3 Encounters:  02/18/24 181 lb (82.1 kg)  12/27/23 184 lb 9.6 oz (83.7 kg)  12/23/23 184 lb (83.5 kg)    Physical Exam Vitals and nursing note reviewed.  Constitutional:      Appearance: She is well-developed.  HENT:     Head: Normocephalic and atraumatic.  Cardiovascular:     Rate and Rhythm: Normal rate and regular rhythm.     Heart sounds: Normal heart sounds. No murmur heard.    No friction rub. No  gallop.  Pulmonary:     Effort: Pulmonary effort is normal. No tachypnea or respiratory distress.     Breath sounds: Normal breath sounds. No decreased breath sounds, wheezing, rhonchi or rales.  Chest:     Chest wall: No tenderness.  Abdominal:     General: Bowel sounds are normal.     Palpations: Abdomen is soft.  Musculoskeletal:        General: Normal range of motion.     Cervical back: Normal range of motion.  Skin:    General: Skin is warm and dry.  Neurological:     Mental Status: She is alert and oriented to person, place, and time.     Coordination: Coordination normal.  Psychiatric:        Behavior: Behavior normal. Behavior is cooperative.        Thought Content: Thought content normal.        Judgment: Judgment normal.          Patient has been counseled extensively about nutrition and exercise as well as the importance of adherence with medications and regular follow-up. The patient was given clear instructions to go to ER or return to medical center if symptoms don't improve, worsen or new problems develop. The patient verbalized understanding.    Follow-up: Return for pcp f/u 3 months.   Claiborne Rigg, FNP-BC [facility] [place], [place] [phone]   02/18/2024, 3:31 PM

## 2024-02-19 LAB — BASIC METABOLIC PANEL WITH GFR
BUN/Creatinine Ratio: 9 (ref 9–23)
BUN: 7 mg/dL (ref 6–24)
CO2: 27 mmol/L (ref 20–29)
Calcium: 9.9 mg/dL (ref 8.7–10.2)
Chloride: 96 mmol/L (ref 96–106)
Creatinine, Ser: 0.75 mg/dL (ref 0.57–1.00)
Glucose: 89 mg/dL (ref 70–99)
Potassium: 3.6 mmol/L (ref 3.5–5.2)
Sodium: 138 mmol/L (ref 134–144)
eGFR: 101 mL/min/{1.73_m2} (ref >59)

## 2024-02-23 ENCOUNTER — Ambulatory Visit (INDEPENDENT_AMBULATORY_CARE_PROVIDER_SITE_OTHER)
Admission: RE | Admit: 2024-02-23 | Discharge: 2024-02-23 | Disposition: A | Discharge status: Home / Self Care | Payer: BC Managed Care – PPO | Source: Ambulatory Visit | Attending: Vascular Surgery

## 2024-02-23 ENCOUNTER — Encounter: Payer: Self-pay | Admitting: Nurse Practitioner

## 2024-02-23 ENCOUNTER — Ambulatory Visit (INDEPENDENT_AMBULATORY_CARE_PROVIDER_SITE_OTHER): Payer: BC Managed Care – PPO | Admitting: Physician Assistant

## 2024-02-23 ENCOUNTER — Ambulatory Visit (HOSPITAL_COMMUNITY)
Admission: RE | Admit: 2024-02-23 | Discharge: 2024-02-23 | Disposition: A | Discharge status: Home / Self Care | Payer: BC Managed Care – PPO | Source: Ambulatory Visit | Attending: Vascular Surgery | Admitting: Vascular Surgery

## 2024-02-23 VITALS — BP 131/85 | HR 59 | Temp 97.7°F | Ht 60.0 in | Wt 183.3 lb

## 2024-02-23 DIAGNOSIS — I771 Stricture of artery: Secondary | ICD-10-CM | POA: Insufficient documentation

## 2024-02-23 DIAGNOSIS — I739 Peripheral vascular disease, unspecified: Secondary | ICD-10-CM

## 2024-02-23 LAB — VAS US ABI WITH/WO TBI
Left ABI: 0.98
Right ABI: 0.75

## 2024-02-23 MED ORDER — ASPIRIN 81 MG PO TBEC: 81.0000 mg | DELAYED_RELEASE_TABLET | Freq: Every day | ORAL | 12 refills | Status: AC

## 2024-02-23 MED ORDER — ROSUVASTATIN CALCIUM 10 MG PO TABS: 10.0000 mg | ORAL_TABLET | Freq: Every day | ORAL | 12 refills | Status: AC

## 2024-02-23 NOTE — Progress Notes (Signed)
 Office Note   History of Present Illness   Janice French is a 43 y.o. (03/29/1981) female who presents for surveillance of PAD.  She has a history of left common to external iliac artery stenting on 08/19/2020 by Dr. Randie Heinz.  This was done for critical limb ischemia with rest pain.  She has required recurrent left iliac stenting for in-stent restenosis on 12/28/2022.  She was experiencing lifestyle limiting claudication with her in-stent restenosis.  Her symptoms were resolved after stenting.  She returns today for follow-up.  She says that she has been doing okay.  She says that she just went back to work last month and started experiencing claudication symptoms again.  She says she can walk about 50 to 75 feet before her thighs and calves tire out.  This happens in both legs equally.  She says she has to stop and rest for a few seconds before she can start walking again.  She says that this is fairly limiting but she does not want another procedure right now.  She denies any rest pain or tissue loss.  She is still taking her Plavix daily.  She never started taking her aspirin after her last angiogram.  She also says a couple months ago she ran out of her Crestor.  Current Outpatient Medications  Medication Sig Dispense Refill   aspirin EC 81 MG tablet Take 1 tablet (81 mg total) by mouth daily. Swallow whole. 30 tablet 12   clopidogrel (PLAVIX) 75 MG tablet TAKE 1 TABLET BY MOUTH EVERY DAY 90 tablet 3   fluticasone-salmeterol (ADVAIR HFA) 115-21 MCG/ACT inhaler Inhale 2 puffs into the lungs 2 (two) times daily. 1 each 12   hydrochlorothiazide (HYDRODIURIL) 25 MG tablet Take 1 tablet (25 mg total) by mouth daily. 30 tablet 1   lisinopril (ZESTRIL) 10 MG tablet Take 1 tablet (10 mg total) by mouth daily. 90 tablet 0   metoprolol succinate (TOPROL-XL) 25 MG 24 hr tablet Take 25 mg by mouth daily.     naproxen (NAPROSYN) 500 MG tablet Take 1 tablet (500 mg total) by mouth 2 (two) times daily. 20  tablet 0   rosuvastatin (CRESTOR) 10 MG tablet Take 1 tablet (10 mg total) by mouth daily. 30 tablet 12   acetaminophen (TYLENOL) 500 MG tablet Take 1 tablet (500 mg total) by mouth every 6 (six) hours as needed. (Patient not taking: Reported on 02/23/2024) 30 tablet 0   albuterol (VENTOLIN HFA) 108 (90 Base) MCG/ACT inhaler Inhale 2 puffs into the lungs every 6 (six) hours as needed for wheezing or shortness of breath. 8 g 6   amLODipine (NORVASC) 10 MG tablet Take 1 tablet (10 mg total) by mouth daily. 30 tablet 1   benzonatate (TESSALON) 100 MG capsule Take 1 capsule (100 mg total) by mouth every 8 (eight) hours. (Patient not taking: Reported on 02/23/2024) 21 capsule 0   Capsaicin-Menthol-Methyl Sal (CAPSAICIN-METHYL SAL-MENTHOL) 0.025-1-12 % CREA Apply 1 application topically every 6 (six) hours as needed. Rubbed onto your abdomen every 6 hours if needed for pain and nausea (Patient not taking: Reported on 02/23/2024) 56.6 g 1   nitroGLYCERIN (NITROSTAT) 0.4 MG SL tablet PLACE 1 TABLET UNDER THE TONGUE EVERY 5 MINUTES AS NEEDED (Patient not taking: Reported on 02/23/2024) 25 tablet 6   omeprazole (PRILOSEC) 20 MG capsule Take 1 capsule (20 mg total) by mouth daily. (Patient not taking: Reported on 02/23/2024) 30 capsule 3   No current facility-administered medications for this visit.  REVIEW OF SYSTEMS (negative unless checked):   Cardiac:  []  Chest pain or chest pressure? []  Shortness of breath upon activity? []  Shortness of breath when lying flat? []  Irregular heart rhythm?  Vascular:  [x]  Pain in calf, thigh, or hip brought on by walking? []  Pain in feet at night that wakes you up from your sleep? []  Blood clot in your veins? []  Leg swelling?  Pulmonary:  []  Oxygen at home? []  Productive cough? []  Wheezing?  Neurologic:  []  Sudden weakness in arms or legs? []  Sudden numbness in arms or legs? []  Sudden onset of difficult speaking or slurred speech? []  Temporary loss of vision in  one eye? []  Problems with dizziness?  Gastrointestinal:  []  Blood in stool? []  Vomited blood?  Genitourinary:  []  Burning when urinating? []  Blood in urine?  Psychiatric:  []  Major depression  Hematologic:  []  Bleeding problems? []  Problems with blood clotting?  Dermatologic:  []  Rashes or ulcers?  Constitutional:  []  Fever or chills?  Ear/Nose/Throat:  []  Change in hearing? []  Nose bleeds? []  Sore throat?  Musculoskeletal:  []  Back pain? []  Joint pain? []  Muscle pain?   Physical Examination   Vitals:   02/23/24 0939  BP: 131/85  Pulse: (!) 59  Temp: 97.7 F (36.5 C)  TempSrc: Temporal  SpO2: 98%  Weight: 183 lb 4.8 oz (83.1 kg)  Height: 5' (1.524 m)   Body mass index is 35.8 kg/m.  General:  WDWN in NAD; vital signs documented above Gait: Not observed HENT: WNL, normocephalic Pulmonary: normal non-labored breathing , without rales, rhonchi,  wheezing Cardiac: Regular Abdomen: soft, NT, no masses Skin: without rashes Vascular Exam/Pulses: Nonpalpable pedal pulses.  Intact DP/PT Doppler signals bilaterally Extremities: without ischemic changes, without gangrene , without cellulitis; without open wounds;  Musculoskeletal: no muscle wasting or atrophy  Neurologic: A&O X 3;  No focal weakness or paresthesias are detected Psychiatric:  The pt has Normal affect.  Non-Invasive Vascular imaging   ABI (02/23/2024) R:  ABI: 0.75 (0.97),  PT: mono DP: mono TBI: 0.57 L:  ABI: 0.98 (1.02),  PT: tri DP: bi TBI: 0.52  Aortoiliac Duplex (02/23/2024) Patent left common to external iliac artery stenting without stenosis   Medical Decision Making   Janice French is a 43 y.o. female who presents for surveillance of PAD  Based on the patient's vascular studies, her ABIs on the right have slightly decreased from 0.97 to 0.75.  Her ABIs on the left are essentially unchanged at 0.98 Arterial duplex demonstrates patent left common to external iliac artery  stents without stenosis She denies any rest pain or tissue loss.  She does state that since returning to work last month she has started experiencing claudication in her legs again.  She says she can walk about 50 to 75 feet before her thighs and calves tire out.  This happens equally in both legs.  This does limit her somewhat, but she usually just takes a break before walking again.  She says that she is not ready for another procedure right now On exam she has nonpalpable pedal pulses.  She does have brisk DP/PT Doppler signals bilaterally She states that she recently ran out of her Crestor and also never started taking her aspirin after her last angio.  I have sent in a refill of her Crestor and have encouraged her to start taking a baby aspirin daily.  She will also continue her Plavix.  We can reevaluate her symptoms in 6 months  with repeat left aortoiliac duplex and ABIs   Loel Dubonnet PA-C [facility] Office: [phone]  Clinic MD: Randie Heinz

## 2024-03-03 ENCOUNTER — Encounter: Payer: Self-pay | Admitting: Podiatry

## 2024-03-03 ENCOUNTER — Ambulatory Visit: Admitting: Podiatry

## 2024-03-03 DIAGNOSIS — L600 Ingrowing nail: Secondary | ICD-10-CM

## 2024-03-03 NOTE — Patient Instructions (Signed)

## 2024-03-06 NOTE — Progress Notes (Signed)
 Subjective:   Patient ID: Janice French, female   DOB: 43 y.o.   MRN: [id number]   HPI Patient presents stating she is getting a lot of pain in the dorsal left nailbed medial side and that this has been going on for a while and makes it hard for her to wear shoe gear comfortably.  Patient states has tried to trim patient does not smoke likes to be active   Review of Systems  All other systems reviewed and are negative.       Objective:  Physical Exam Vitals and nursing note reviewed.  Constitutional:      Appearance: She is well-developed.  Pulmonary:     Effort: Pulmonary effort is normal.  Musculoskeletal:        General: Normal range of motion.  Skin:    General: Skin is warm.  Neurological:     Mental Status: She is alert.     Neurovascular status intact muscle strength was found to be adequate range of motion adequate with exquisite discomfort medial border left big toe painful when pressed with inability to wear shoe gear without difficulty.  Patient states it has been very tender and has been getting worse over the last few months with no drainage.  Good digital perfusion well-oriented x 3      Assessment:  Chronic ingrown toenail left big toe very painful when pressed making shoe gear difficult     Plan:  H&P reviewed went ahead today and reviewed ingrown toenail and also the possibility for tissue damage.  At this point we will get a focus on the nail and I did explain procedure and allowed her to read then signed consent form understanding risk.  I infiltrated the left big toe 60 mg Xylocaine Marcaine mixture sterile prep done and using sterile instrumentation repeat moved the medial border and exposed matrix applied phenol 3 applications 30 seconds followed by alcohol lavage sterile dressing gave instructions on soaks and wear dressing 24 hours take it off earlier if throbbing were to occur and encouraged to call questions concerns which may arise during recovery

## 2024-03-07 ENCOUNTER — Telehealth: Payer: Self-pay

## 2024-03-07 NOTE — Telephone Encounter (Signed)
 Patient called and left message. She had nail procedure on 4/11 - she went to work Monday, wore her steel toed shoes as required by her job. Her toe was very swollen, painful and red by the end of the day - she stayed out of work today - she needs a note for several days out of work until she can heal enough to wear her steel toes - is this OK?

## 2024-03-08 NOTE — Telephone Encounter (Signed)
 That's fine

## 2024-03-10 ENCOUNTER — Encounter (HOSPITAL_BASED_OUTPATIENT_CLINIC_OR_DEPARTMENT_OTHER): Payer: Self-pay | Admitting: Emergency Medicine

## 2024-03-10 ENCOUNTER — Emergency Department (HOSPITAL_BASED_OUTPATIENT_CLINIC_OR_DEPARTMENT_OTHER)

## 2024-03-10 ENCOUNTER — Emergency Department (HOSPITAL_BASED_OUTPATIENT_CLINIC_OR_DEPARTMENT_OTHER)
Admission: EM | Admit: 2024-03-10 | Discharge: 2024-03-10 | Disposition: A | Discharge status: Home / Self Care | Attending: Emergency Medicine | Admitting: Emergency Medicine

## 2024-03-10 ENCOUNTER — Other Ambulatory Visit: Payer: Self-pay

## 2024-03-10 DIAGNOSIS — Y990 Civilian activity done for income or pay: Secondary | ICD-10-CM | POA: Insufficient documentation

## 2024-03-10 DIAGNOSIS — Z79899 Other long term (current) drug therapy: Secondary | ICD-10-CM | POA: Insufficient documentation

## 2024-03-10 DIAGNOSIS — Z7901 Long term (current) use of anticoagulants: Secondary | ICD-10-CM | POA: Diagnosis not present

## 2024-03-10 DIAGNOSIS — Z7982 Long term (current) use of aspirin: Secondary | ICD-10-CM | POA: Insufficient documentation

## 2024-03-10 DIAGNOSIS — I10 Essential (primary) hypertension: Secondary | ICD-10-CM | POA: Diagnosis not present

## 2024-03-10 DIAGNOSIS — W208XXA Other cause of strike by thrown, projected or falling object, initial encounter: Secondary | ICD-10-CM | POA: Insufficient documentation

## 2024-03-10 DIAGNOSIS — S63501A Unspecified sprain of right wrist, initial encounter: Secondary | ICD-10-CM | POA: Insufficient documentation

## 2024-03-10 DIAGNOSIS — S6991XA Unspecified injury of right wrist, hand and finger(s), initial encounter: Secondary | ICD-10-CM | POA: Diagnosis not present

## 2024-03-10 MED ORDER — IBUPROFEN 400 MG PO TABS
600.0000 mg | ORAL_TABLET | Freq: Once | ORAL | Status: AC
  Administered 2024-03-10: 600 mg via ORAL
  Filled 2024-03-10: qty 1

## 2024-03-10 NOTE — ED Provider Notes (Signed)
 [place] [facility] Provider Note   CSN: [id number] Arrival date & time: 03/10/24  2130     History  Chief Complaint  Patient presents with   Hand Injury    Janice French is a 43 y.o. female.  The history is provided by the patient.  Hand Injury Janice French is a 43 y.o. female who presents to the Emergency Department complaining of hand injury.  Presents to the emergency department for evaluation of injury to her right hand that occurred 1 week ago when she was at work and a stack of wood fell onto her right arm.  She did not have any significant pain in her wrist at that time and then when she went back to work on Wednesday she noticed pain in the radial aspect of her right wrist when she would move it.  Pain persist, which prompted evaluation.  She is right-hand dominant.  She has a history of hypertension, peripheral arterial disease status post stenting.  She does take Plavix .       Home Medications Prior to Admission medications   Medication Sig Start Date End Date Taking? Authorizing Provider  acetaminophen  (TYLENOL ) 500 MG tablet Take 1 tablet (500 mg total) by mouth every 6 (six) hours as needed. 01/30/23   Debbra Fairy, PA-C  albuterol  (VENTOLIN  HFA) 108 (90 Base) MCG/ACT inhaler Inhale 2 puffs into the lungs every 6 (six) hours as needed for wheezing or shortness of breath. 01/29/23   Wilfredo Hanly, MD  amLODipine  (NORVASC ) 10 MG tablet Take 1 tablet (10 mg total) by mouth daily. 03/03/22   Smoot, Genevive Ket, PA-C  aspirin  EC 81 MG tablet Take 1 tablet (81 mg total) by mouth daily. Swallow whole. 02/23/24   Schuh, McKenzi P, PA-C  benzonatate  (TESSALON ) 100 MG capsule Take 1 capsule (100 mg total) by mouth every 8 (eight) hours. 01/30/23   Debbra Fairy, PA-C  Capsaicin-Menthol-Methyl Sal (CAPSAICIN-METHYL SAL-MENTHOL) 0.025-1-12 % CREA Apply 1 application topically every 6 (six) hours as needed. Rubbed onto your abdomen every 6 hours if  needed for pain and nausea 06/23/19   Wynetta Heckle, MD  clopidogrel  (PLAVIX ) 75 MG tablet TAKE 1 TABLET BY MOUTH EVERY DAY 12/10/23   Adine Hoof, MD  fluticasone -salmeterol (ADVAIR HFA) 115-21 MCG/ACT inhaler Inhale 2 puffs into the lungs 2 (two) times daily. 10/29/22   Wilfredo Hanly, MD  hydrochlorothiazide  (HYDRODIURIL ) 25 MG tablet Take 1 tablet (25 mg total) by mouth daily. 03/04/22   Mayers, Cari S, PA-C  lisinopril  (ZESTRIL ) 10 MG tablet Take 1 tablet (10 mg total) by mouth daily. 12/27/23   Abraham Abo, MD  metoprolol  succinate (TOPROL -XL) 25 MG 24 hr tablet Take 25 mg by mouth daily. 07/08/22   [provider]  naproxen  (NAPROSYN ) 500 MG tablet Take 1 tablet (500 mg total) by mouth 2 (two) times daily. 11/18/23   Orvilla Blander, MD  nitroGLYCERIN  (NITROSTAT ) 0.4 MG SL tablet PLACE 1 TABLET UNDER THE TONGUE EVERY 5 MINUTES AS NEEDED 03/23/23   Revankar, Micael Adas, MD  omeprazole  (PRILOSEC) 20 MG capsule Take 1 capsule (20 mg total) by mouth daily. 03/04/22   Mayers, Cari S, PA-C  rosuvastatin  (CRESTOR ) 10 MG tablet Take 1 tablet (10 mg total) by mouth daily. 02/23/24   Schuh, McKenzi P, PA-C  promethazine  (PHENERGAN ) 25 MG tablet Take 1 tablet (25 mg total) by mouth every 6 (six) hours as needed for nausea or vomiting. 06/23/19 08/06/20  Wynetta Heckle, MD  Allergies    Patient has no known allergies.    Review of Systems   Review of Systems  All other systems reviewed and are negative.   Physical Exam Updated Vital Signs BP 139/87   Pulse 92   Temp 98.2 F (36.8 C)   Resp 18   SpO2 99%  Physical Exam Vitals and nursing note reviewed.  Constitutional:      Appearance: She is well-developed.  HENT:     Head: Normocephalic and atraumatic.  Cardiovascular:     Rate and Rhythm: Normal rate and regular rhythm.  Pulmonary:     Effort: Pulmonary effort is normal. No respiratory distress.  Musculoskeletal:     Comments: Symmetric radial pulses.  There is  no significant tenderness to palpation over the wrist.  No snuffbox tenderness.  She does have pain with flexion extension of the wrist but she is able to range her wrist.  5 out of 5 grip strength bilaterally.  Sensation is light touch intact throughout the digits.  Skin:    General: Skin is warm and dry.  Neurological:     Mental Status: She is alert and oriented to person, place, and time.  Psychiatric:        Behavior: Behavior normal.     ED Results / Procedures / Treatments   Labs (all labs ordered are listed, but only abnormal results are displayed) Labs Reviewed - No data to display  EKG None  Radiology DG Hand Complete Right Result Date: 03/10/2024 CLINICAL DATA:  Right hand crush injury EXAM: RIGHT HAND - COMPLETE 3+ VIEW COMPARISON:  None Available. FINDINGS: There is no evidence of fracture or dislocation. There is no evidence of arthropathy or other focal bone abnormality. Soft tissues are unremarkable. IMPRESSION: Negative. Electronically Signed   By: Worthy Heads M.D.   On: 03/10/2024 01:22    Procedures Procedures    Medications Ordered in ED Medications  ibuprofen  (ADVIL ) tablet 600 mg (600 mg Oral Given 03/10/24 0146)    ED Course/ Medical Decision Making/ A&P                                 Medical Decision Making Amount and/or Complexity of Data Reviewed Radiology: ordered.   Patient here for evaluation of right wrist pain following an injury that occurred 1 week ago.  She has some pain on movement with evaluation but no discrete bony tenderness.  Plain film is negative for acute fracture or dislocation, images personally reviewed and interpreted, agree with radiologist interpretation.  Suspect that she has some degree of sprain or contusion.  Will place in wrist splint that she may use as needed.  Discussed ibuprofen  as needed pain.  Plan to discharge with outpatient follow-up and return precautions.        Final Clinical Impression(s) / ED  Diagnoses Final diagnoses:  Sprain of right wrist, initial encounter    Rx / DC Orders ED Discharge Orders     None         Kelsey Patricia, MD 03/10/24 0211

## 2024-03-10 NOTE — ED Triage Notes (Signed)
 Pt had some wood fall on her right hand at work last week.  Did not feel pain at the time but today when she returned to work the hand/wrist began to swell and become more painful.  Pt has ice pack in place.

## 2024-03-25 ENCOUNTER — Emergency Department (HOSPITAL_BASED_OUTPATIENT_CLINIC_OR_DEPARTMENT_OTHER)

## 2024-03-25 ENCOUNTER — Encounter (HOSPITAL_BASED_OUTPATIENT_CLINIC_OR_DEPARTMENT_OTHER): Payer: Self-pay | Admitting: Emergency Medicine

## 2024-03-25 ENCOUNTER — Emergency Department (HOSPITAL_BASED_OUTPATIENT_CLINIC_OR_DEPARTMENT_OTHER)
Admission: EM | Admit: 2024-03-25 | Discharge: 2024-03-25 | Disposition: A | Discharge status: Home / Self Care | Attending: Emergency Medicine | Admitting: Emergency Medicine

## 2024-03-25 ENCOUNTER — Other Ambulatory Visit: Payer: Self-pay

## 2024-03-25 DIAGNOSIS — Z79899 Other long term (current) drug therapy: Secondary | ICD-10-CM | POA: Insufficient documentation

## 2024-03-25 DIAGNOSIS — Z7951 Long term (current) use of inhaled steroids: Secondary | ICD-10-CM | POA: Insufficient documentation

## 2024-03-25 DIAGNOSIS — R0789 Other chest pain: Secondary | ICD-10-CM | POA: Diagnosis not present

## 2024-03-25 DIAGNOSIS — I10 Essential (primary) hypertension: Secondary | ICD-10-CM | POA: Insufficient documentation

## 2024-03-25 DIAGNOSIS — R079 Chest pain, unspecified: Secondary | ICD-10-CM | POA: Diagnosis not present

## 2024-03-25 DIAGNOSIS — Z7901 Long term (current) use of anticoagulants: Secondary | ICD-10-CM | POA: Insufficient documentation

## 2024-03-25 DIAGNOSIS — Z7982 Long term (current) use of aspirin: Secondary | ICD-10-CM | POA: Insufficient documentation

## 2024-03-25 DIAGNOSIS — J45909 Unspecified asthma, uncomplicated: Secondary | ICD-10-CM | POA: Insufficient documentation

## 2024-03-25 LAB — BASIC METABOLIC PANEL WITH GFR
Anion gap: 14 (ref 5–15)
BUN: 10 mg/dL (ref 6–20)
CO2: 25 mmol/L (ref 22–32)
Calcium: 9.6 mg/dL (ref 8.9–10.3)
Chloride: 101 mmol/L (ref 98–111)
Creatinine, Ser: 0.68 mg/dL (ref 0.44–1.00)
GFR, Estimated: >60 mL/min (ref >60)
Glucose, Bld: 120 mg/dL — ABNORMAL HIGH (ref 70–99)
Potassium: 3.1 mmol/L — ABNORMAL LOW (ref 3.5–5.1)
Sodium: 139 mmol/L (ref 135–145)

## 2024-03-25 LAB — CBC
HCT: 38.6 % (ref 36.0–46.0)
Hemoglobin: 13.6 g/dL (ref 12.0–15.0)
MCH: 32.1 pg (ref 26.0–34.0)
MCHC: 35.2 g/dL (ref 30.0–36.0)
MCV: 91 fL (ref 80.0–100.0)
Platelets: 329 10*3/uL (ref 150–400)
RBC: 4.24 MIL/uL (ref 3.87–5.11)
RDW: 14.3 % (ref 11.5–15.5)
WBC: 13.1 10*3/uL — ABNORMAL HIGH (ref 4.0–10.5)
nRBC: 0 % (ref 0.0–0.2)

## 2024-03-25 LAB — TROPONIN T, HIGH SENSITIVITY
Troponin T High Sensitivity: <15 ng/L (ref <19)
Troponin T High Sensitivity: <15 ng/L (ref <19)

## 2024-03-25 MED ORDER — IOHEXOL 350 MG/ML SOLN
75.0000 mL | Freq: Once | INTRAVENOUS | Status: AC | PRN
  Administered 2024-03-25: 75 mL via INTRAVENOUS

## 2024-03-25 MED ORDER — ETODOLAC 300 MG PO CAPS: 300.0000 mg | ORAL_CAPSULE | Freq: Three times a day (TID) | ORAL | 0 refills | Status: DC

## 2024-03-25 MED ORDER — POTASSIUM CHLORIDE CRYS ER 20 MEQ PO TBCR
40.0000 meq | EXTENDED_RELEASE_TABLET | Freq: Once | ORAL | Status: AC
  Administered 2024-03-25: 40 meq via ORAL
  Filled 2024-03-25: qty 2

## 2024-03-25 MED ORDER — ACETAMINOPHEN 500 MG PO TABS
1000.0000 mg | ORAL_TABLET | Freq: Once | ORAL | Status: AC
  Administered 2024-03-25: 1000 mg via ORAL
  Filled 2024-03-25: qty 2

## 2024-03-25 MED ORDER — KETOROLAC TROMETHAMINE 30 MG/ML IJ SOLN
15.0000 mg | Freq: Once | INTRAMUSCULAR | Status: AC
  Administered 2024-03-25: 15 mg via INTRAVENOUS
  Filled 2024-03-25: qty 1

## 2024-03-25 NOTE — ED Notes (Signed)
 Reviewed discharge instructions, follow up and medications with pt. Pt states understanding. Assisted to transport chair and wheeled to lobby . Daughter to transport home

## 2024-03-25 NOTE — ED Provider Notes (Signed)
 Patient was initially seen by Dr. Laird Pih.  Please see her note.  Patient presented to the ED with complaints of sharp central chest pain.  At the time of shift change patient's second troponin and CT angio were pending.  Patient CT angiogram was negative for PE.  Serial troponins are normal.  Evaluation and diagnostic testing in the emergency department does not suggest an emergent condition requiring admission or immediate intervention beyond what has been performed at this time.  The patient is safe for discharge and has been instructed to return immediately for worsening symptoms, change in symptoms or any other concerns.    Trish Furl, MD 03/25/24 [phone]

## 2024-03-25 NOTE — ED Notes (Signed)
 Patient transported to CT

## 2024-03-25 NOTE — Discharge Instructions (Addendum)
 Take the medications to help with pain and discomfort.  Follow-up with your doctor next week to be rechecked.  Return to the ED as needed for worsening symptoms
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
## 2024-04-24 ENCOUNTER — Encounter (HOSPITAL_BASED_OUTPATIENT_CLINIC_OR_DEPARTMENT_OTHER): Payer: Self-pay | Admitting: Emergency Medicine

## 2024-04-24 ENCOUNTER — Emergency Department (HOSPITAL_BASED_OUTPATIENT_CLINIC_OR_DEPARTMENT_OTHER)

## 2024-04-24 ENCOUNTER — Other Ambulatory Visit: Payer: Self-pay

## 2024-04-24 ENCOUNTER — Emergency Department (HOSPITAL_BASED_OUTPATIENT_CLINIC_OR_DEPARTMENT_OTHER): Admission: EM | Admit: 2024-04-24 | Discharge: 2024-04-24 | Disposition: A | Discharge status: Home / Self Care

## 2024-04-24 DIAGNOSIS — I10 Essential (primary) hypertension: Secondary | ICD-10-CM | POA: Diagnosis not present

## 2024-04-24 DIAGNOSIS — Z7902 Long term (current) use of antithrombotics/antiplatelets: Secondary | ICD-10-CM | POA: Diagnosis not present

## 2024-04-24 DIAGNOSIS — J9811 Atelectasis: Secondary | ICD-10-CM | POA: Diagnosis not present

## 2024-04-24 DIAGNOSIS — X500XXA Overexertion from strenuous movement or load, initial encounter: Secondary | ICD-10-CM | POA: Insufficient documentation

## 2024-04-24 DIAGNOSIS — M25511 Pain in right shoulder: Secondary | ICD-10-CM | POA: Insufficient documentation

## 2024-04-24 DIAGNOSIS — J45909 Unspecified asthma, uncomplicated: Secondary | ICD-10-CM | POA: Insufficient documentation

## 2024-04-24 DIAGNOSIS — Z79899 Other long term (current) drug therapy: Secondary | ICD-10-CM | POA: Diagnosis not present

## 2024-04-24 DIAGNOSIS — Z7951 Long term (current) use of inhaled steroids: Secondary | ICD-10-CM | POA: Diagnosis not present

## 2024-04-24 DIAGNOSIS — R079 Chest pain, unspecified: Secondary | ICD-10-CM

## 2024-04-24 DIAGNOSIS — Z7982 Long term (current) use of aspirin: Secondary | ICD-10-CM | POA: Insufficient documentation

## 2024-04-24 DIAGNOSIS — R0789 Other chest pain: Secondary | ICD-10-CM | POA: Diagnosis present

## 2024-04-24 LAB — CBC
HCT: 38.5 % (ref 36.0–46.0)
Hemoglobin: 13 g/dL (ref 12.0–15.0)
MCH: 31.9 pg (ref 26.0–34.0)
MCHC: 33.8 g/dL (ref 30.0–36.0)
MCV: 94.6 fL (ref 80.0–100.0)
Platelets: 360 10*3/uL (ref 150–400)
RBC: 4.07 MIL/uL (ref 3.87–5.11)
RDW: 15.1 % (ref 11.5–15.5)
WBC: 9.2 10*3/uL (ref 4.0–10.5)
nRBC: 0 % (ref 0.0–0.2)

## 2024-04-24 LAB — BASIC METABOLIC PANEL WITH GFR
Anion gap: 10 (ref 5–15)
BUN: 5 mg/dL — ABNORMAL LOW (ref 6–20)
CO2: 25 mmol/L (ref 22–32)
Calcium: 9.2 mg/dL (ref 8.9–10.3)
Chloride: 104 mmol/L (ref 98–111)
Creatinine, Ser: 0.61 mg/dL (ref 0.44–1.00)
GFR, Estimated: >60 mL/min (ref >60)
Glucose, Bld: 92 mg/dL (ref 70–99)
Potassium: 3.6 mmol/L (ref 3.5–5.1)
Sodium: 139 mmol/L (ref 135–145)

## 2024-04-24 LAB — TROPONIN T, HIGH SENSITIVITY
Troponin T High Sensitivity: <15 ng/L (ref <19)
Troponin T High Sensitivity: <15 ng/L (ref <19)

## 2024-04-24 LAB — D-DIMER, QUANTITATIVE: D-Dimer, Quant: 0.32 ug{FEU}/mL (ref 0.00–0.50)

## 2024-04-24 LAB — PREGNANCY, URINE: Preg Test, Ur: NEGATIVE

## 2024-04-24 MED ORDER — ALBUTEROL SULFATE HFA 108 (90 BASE) MCG/ACT IN AERS: 2.0000 | INHALATION_SPRAY | RESPIRATORY_TRACT | 0 refills | Status: AC | PRN

## 2024-04-24 MED ORDER — HYDROCHLOROTHIAZIDE 25 MG PO TABS
25.0000 mg | ORAL_TABLET | Freq: Once | ORAL | Status: AC
  Administered 2024-04-24: 25 mg via ORAL
  Filled 2024-04-24: qty 1

## 2024-04-24 MED ORDER — IPRATROPIUM-ALBUTEROL 0.5-2.5 (3) MG/3ML IN SOLN
3.0000 mL | Freq: Once | RESPIRATORY_TRACT | Status: AC
  Administered 2024-04-24: 3 mL via RESPIRATORY_TRACT
  Filled 2024-04-24: qty 3

## 2024-04-24 MED ORDER — AMLODIPINE BESYLATE 5 MG PO TABS
10.0000 mg | ORAL_TABLET | Freq: Once | ORAL | Status: AC
  Administered 2024-04-24: 10 mg via ORAL
  Filled 2024-04-24 (×2): qty 2

## 2024-04-24 MED ORDER — KETOROLAC TROMETHAMINE 15 MG/ML IJ SOLN
15.0000 mg | Freq: Once | INTRAMUSCULAR | Status: AC
  Administered 2024-04-24: 15 mg via INTRAVENOUS
  Filled 2024-04-24: qty 1

## 2024-04-24 MED ORDER — LISINOPRIL 10 MG PO TABS
10.0000 mg | ORAL_TABLET | Freq: Once | ORAL | Status: AC
  Administered 2024-04-24: 10 mg via ORAL
  Filled 2024-04-24: qty 1

## 2024-04-24 MED ORDER — NAPROXEN 500 MG PO TABS
500.0000 mg | ORAL_TABLET | Freq: Two times a day (BID) | ORAL | 0 refills | Status: AC
Start: 2024-04-24 — End: ?

## 2024-04-24 NOTE — Discharge Instructions (Signed)
 Your workup today was reassuring.  Please use 2 puffs of the albuterol  as needed for chest tightness or shortness of breath.  Be sure to take your blood pressure medications as your blood pressure on arrival was significantly elevated.  Take the naproxen  twice daily for pain over the next few days.  Follow-up with your doctor.  Return to the ER for worsening symptoms.

## 2024-04-24 NOTE — ED Triage Notes (Signed)
 Pt POV c/o L chest pain worse with laughing, coughing since Friday. Denies n/v/diaphoresis.   Pt smokes. Does repetitive heavy lifting at work.   Also R arm pain since Friday, worse with movement.   Pt reports she has not taken BP meds today.

## 2024-04-24 NOTE — ED Provider Notes (Signed)
 [place] EM[facility] AT [facility] Provider Note   CSN: [id number] Arrival date & time: 04/24/24  1836     History  Chief Complaint  Patient presents with   Chest Pain   Extremity Pain    Janice French is a 43 y.o. female.  43 year old female with past medical history of asthma and hypertension presenting to the emergency department today with chest pain.  The patient states that she has been having chest pain now over the past 2 days.  She states that she normally takes multiple blood pressure medications but forgot to this morning.  She states that the pain is in the center of her chest and is worse with deep breathing or laughing.  It is somewhat worse with movement but mostly with just breathing.  States she is also having pain in her right shoulder.  This been going now for the past few weeks after doing some heavy lifting at work.  She denies any obvious injuries to this.  Denies any fevers.  She denies any focal weakness, numbness, or tingling.   Chest Pain Extremity Pain Associated symptoms include chest pain.       Home Medications Prior to Admission medications   Medication Sig Start Date End Date Taking? Authorizing Provider  albuterol  (VENTOLIN  HFA) 108 (90 Base) MCG/ACT inhaler Inhale 2 puffs into the lungs every 4 (four) hours as needed for wheezing or shortness of breath. 04/24/24  Yes Carin Charleston, MD  naproxen  (NAPROSYN ) 500 MG tablet Take 1 tablet (500 mg total) by mouth 2 (two) times daily. 04/24/24  Yes Carin Charleston, MD  acetaminophen  (TYLENOL ) 500 MG tablet Take 1 tablet (500 mg total) by mouth every 6 (six) hours as needed. 01/30/23   Debbra Fairy, PA-C  albuterol  (VENTOLIN  HFA) 108 (90 Base) MCG/ACT inhaler Inhale 2 puffs into the lungs every 6 (six) hours as needed for wheezing or shortness of breath. 01/29/23   Wilfredo Hanly, MD  amLODipine  (NORVASC ) 10 MG tablet Take 1 tablet (10 mg total) by mouth daily. 03/03/22   Smoot, Genevive Ket,  PA-C  aspirin  EC 81 MG tablet Take 1 tablet (81 mg total) by mouth daily. Swallow whole. 02/23/24   Schuh, McKenzi P, PA-C  benzonatate  (TESSALON ) 100 MG capsule Take 1 capsule (100 mg total) by mouth every 8 (eight) hours. 01/30/23   Debbra Fairy, PA-C  Capsaicin-Menthol-Methyl Sal (CAPSAICIN-METHYL SAL-MENTHOL) 0.025-1-12 % CREA Apply 1 application topically every 6 (six) hours as needed. Rubbed onto your abdomen every 6 hours if needed for pain and nausea 06/23/19   Wynetta Heckle, MD  clopidogrel  (PLAVIX ) 75 MG tablet TAKE 1 TABLET BY MOUTH EVERY DAY 12/10/23   Adine Hoof, MD  fluticasone -salmeterol (ADVAIR HFA) 115-21 MCG/ACT inhaler Inhale 2 puffs into the lungs 2 (two) times daily. 10/29/22   Wilfredo Hanly, MD  hydrochlorothiazide  (HYDRODIURIL ) 25 MG tablet Take 1 tablet (25 mg total) by mouth daily. 03/04/22   Mayers, Cari S, PA-C  lisinopril  (ZESTRIL ) 10 MG tablet Take 1 tablet (10 mg total) by mouth daily. 12/27/23   Abraham Abo, MD  metoprolol  succinate (TOPROL -XL) 25 MG 24 hr tablet Take 25 mg by mouth daily. 07/08/22   [provider]  nitroGLYCERIN  (NITROSTAT ) 0.4 MG SL tablet PLACE 1 TABLET UNDER THE TONGUE EVERY 5 MINUTES AS NEEDED 03/23/23   Revankar, Micael Adas, MD  omeprazole  (PRILOSEC) 20 MG capsule Take 1 capsule (20 mg total) by mouth daily. 03/04/22   Mayers, Davene Ernst  S, PA-C  rosuvastatin  (CRESTOR ) 10 MG tablet Take 1 tablet (10 mg total) by mouth daily. 02/23/24   Schuh, McKenzi P, PA-C  promethazine  (PHENERGAN ) 25 MG tablet Take 1 tablet (25 mg total) by mouth every 6 (six) hours as needed for nausea or vomiting. 06/23/19 08/06/20  Wynetta Heckle, MD      Allergies    Patient has no known allergies.    Review of Systems   Review of Systems  Cardiovascular:  Positive for chest pain.  All other systems reviewed and are negative.   Physical Exam Updated Vital Signs BP (!) 151/89   Pulse 85   Temp 98.1 F (36.7 C) (Oral)   Resp 18   Ht 5' (1.524 m)   Wt  81.6 kg   LMP  (LMP Unknown)   SpO2 100%   BMI 35.15 kg/m  Physical Exam Vitals and nursing note reviewed.   Gen: NAD Eyes: PERRL, EOMI HEENT: no oropharyngeal swelling Neck: trachea midline Resp: clear to auscultation bilaterally Card: RRR, no murmurs, rubs, or gallops Abd: nontender, nondistended Extremities: no calf tenderness, no edema Vascular: 2+ radial pulses bilaterally, 2+ DP pulses bilaterally Neuro: Cranial nerves intact, equal strength and sensation throughout bilateral upper and lower extremities  Skin: no rashes Psyc: acting appropriately   ED Results / Procedures / Treatments   Labs (all labs ordered are listed, but only abnormal results are displayed) Labs Reviewed  BASIC METABOLIC PANEL WITH GFR - Abnormal; Notable for the following components:      Result Value   BUN 5 (*)    All other components within normal limits  CBC  PREGNANCY, URINE  D-DIMER, QUANTITATIVE  TROPONIN T, HIGH SENSITIVITY  TROPONIN T, HIGH SENSITIVITY    EKG EKG Interpretation Date/Time:  Monday April 24 2024 18:55:30 EDT Ventricular Rate:  76 PR Interval:  153 QRS Duration:  78 QT Interval:  381 QTC Calculation: 429 R Axis:   69  Text Interpretation: Sinus rhythm Confirmed by Abner Hoffman [phone]) on 04/24/2024 7:04:08 PM  Radiology DG Chest 2 View Result Date: 04/24/2024 CLINICAL DATA:  Chest pain EXAM: CHEST - 2 VIEW COMPARISON:  Chest x-ray 03/25/2024 FINDINGS: The heart size and mediastinal contours are within normal limits. There is linear atelectasis in the lingula. The lungs are otherwise clear. There is no pleural effusion or pneumothorax. The visualized skeletal structures are unremarkable. IMPRESSION: Linear atelectasis in the lingula. Electronically Signed   By: Tyron Gallon M.D.   On: 04/24/2024 19:47    Procedures Procedures    Medications Ordered in ED Medications  amLODipine  (NORVASC ) tablet 10 mg (10 mg Oral Given 04/24/24 2135)  hydrochlorothiazide   (HYDRODIURIL ) tablet 25 mg (25 mg Oral Given 04/24/24 2126)  lisinopril  (ZESTRIL ) tablet 10 mg (10 mg Oral Given 04/24/24 2126)  ipratropium-albuterol  (DUONEB) 0.5-2.5 (3) MG/3ML nebulizer solution 3 mL (3 mLs Nebulization Given 04/24/24 2131)  ketorolac  (TORADOL ) 15 MG/ML injection 15 mg (15 mg Intravenous Given 04/24/24 2124)    ED Course/ Medical Decision Making/ A&P                                 Medical Decision Making 43 year old female with past medical history of hypertension and asthma presenting to the emergency department today with chest pain and right shoulder pain.  These do not seem to be temporally related.  Will further evaluate her here with basic lab as well as an EKG, chest x-ray,  troponin for further evaluation for ACS, pulmonary edema, pulmonary infiltrates, pneumothorax.  Given the pleuritic nature of her pain will obtain a D-dimer for further evaluation for pulmonary embolism.  The patient does not have any findings on exam consistent with septic arthritis at this time.  She denies any recent injuries.  Do not think that radiographs of her shoulder are indicated at this time.  I will give her Toradol  for pain.  Symptoms may be due to costochondritis.  Her blood pressure is elevated here and she forgot to take her blood pressure medications.  Based on the description of her symptoms and with reassuring neurovascular exam suspicion for aortic dissection is low at this time.  She is also comfortable appearing on exam which goes against this as well.  Will give her her blood pressure medications and reevaluate for ultimate disposition.  The patient's work appears reassuring.  Blood pressures improved with medication here.  Symptoms have improved as well.  She will be discharged with return precautions.  Suspect that the chest pain is due to costochondritis as she is reporting this is worse with movement or possible pleurisy.    Amount and/or Complexity of Data Reviewed Labs:  ordered. Radiology: ordered.  Risk Prescription drug management.           Final Clinical Impression(s) / ED Diagnoses Final diagnoses:  Chest pain, unspecified type  Uncontrolled hypertension    Rx / DC Orders ED Discharge Orders          Ordered    albuterol  (VENTOLIN  HFA) 108 (90 Base) MCG/ACT inhaler  Every 4 hours PRN        04/24/24 2204    naproxen  (NAPROSYN ) 500 MG tablet  2 times daily        04/24/24 2204              Carin Charleston, MD 04/24/24 2211

## 2024-05-12 ENCOUNTER — Ambulatory Visit: Admitting: Family Medicine

## 2024-05-12 ENCOUNTER — Encounter: Payer: Self-pay | Admitting: Family Medicine

## 2024-05-12 VITALS — BP 146/92 | HR 82 | Wt 187.0 lb

## 2024-05-12 DIAGNOSIS — I10 Essential (primary) hypertension: Secondary | ICD-10-CM

## 2024-05-12 DIAGNOSIS — Z6836 Body mass index (BMI) 36.0-36.9, adult: Secondary | ICD-10-CM

## 2024-05-12 DIAGNOSIS — F17200 Nicotine dependence, unspecified, uncomplicated: Secondary | ICD-10-CM

## 2024-05-12 DIAGNOSIS — E66812 Obesity, class 2: Secondary | ICD-10-CM

## 2024-05-12 DIAGNOSIS — I739 Peripheral vascular disease, unspecified: Secondary | ICD-10-CM | POA: Diagnosis not present

## 2024-05-12 DIAGNOSIS — J4530 Mild persistent asthma, uncomplicated: Secondary | ICD-10-CM

## 2024-05-12 MED ORDER — SPACER/AERO-HOLDING CHAMBERS DEVI: 0 refills | Status: AC

## 2024-05-12 MED ORDER — FLUTICASONE PROPIONATE HFA 110 MCG/ACT IN AERO: 1.0000 | INHALATION_SPRAY | Freq: Two times a day (BID) | RESPIRATORY_TRACT | 12 refills | Status: AC

## 2024-05-12 MED ORDER — AIRSUPRA 90-80 MCG/ACT IN AERO: 90.0000 mg | INHALATION_SPRAY | Freq: Three times a day (TID) | RESPIRATORY_TRACT | 5 refills | Status: AC | PRN

## 2024-05-12 NOTE — Progress Notes (Unsigned)
 Established Patient Office Visit  Subjective    Patient ID: Janice French, female    DOB: 03-16-1981  Age: 43 y.o. MRN: [id number]  CC:  Chief Complaint  Patient presents with  . Medical Management of Chronic Issues    Wants to talk about disability   . Dizziness    HPI Janice French presents ***  Outpatient Encounter Medications as of 05/12/2024  Medication Sig  . acetaminophen  (TYLENOL ) 500 MG tablet Take 1 tablet (500 mg total) by mouth every 6 (six) hours as needed.  . albuterol  (VENTOLIN  HFA) 108 (90 Base) MCG/ACT inhaler Inhale 2 puffs into the lungs every 6 (six) hours as needed for wheezing or shortness of breath.  . albuterol  (VENTOLIN  HFA) 108 (90 Base) MCG/ACT inhaler Inhale 2 puffs into the lungs every 4 (four) hours as needed for wheezing or shortness of breath.  . amLODipine  (NORVASC ) 10 MG tablet Take 1 tablet (10 mg total) by mouth daily.  . aspirin  EC 81 MG tablet Take 1 tablet (81 mg total) by mouth daily. Swallow whole.  . benzonatate  (TESSALON ) 100 MG capsule Take 1 capsule (100 mg total) by mouth every 8 (eight) hours.  . Capsaicin-Menthol-Methyl Sal (CAPSAICIN-METHYL SAL-MENTHOL) 0.025-1-12 % CREA Apply 1 application topically every 6 (six) hours as needed. Rubbed onto your abdomen every 6 hours if needed for pain and nausea  . clopidogrel  (PLAVIX ) 75 MG tablet TAKE 1 TABLET BY MOUTH EVERY DAY  . fluticasone -salmeterol (ADVAIR HFA) 115-21 MCG/ACT inhaler Inhale 2 puffs into the lungs 2 (two) times daily.  . hydrochlorothiazide  (HYDRODIURIL ) 25 MG tablet Take 1 tablet (25 mg total) by mouth daily.  . lisinopril  (ZESTRIL ) 10 MG tablet Take 1 tablet (10 mg total) by mouth daily.  . metoprolol  succinate (TOPROL -XL) 25 MG 24 hr tablet Take 25 mg by mouth daily.  . naproxen  (NAPROSYN ) 500 MG tablet Take 1 tablet (500 mg total) by mouth 2 (two) times daily.  . nitroGLYCERIN  (NITROSTAT ) 0.4 MG SL tablet PLACE 1 TABLET UNDER THE TONGUE EVERY 5 MINUTES AS NEEDED  .  omeprazole  (PRILOSEC) 20 MG capsule Take 1 capsule (20 mg total) by mouth daily.  . rosuvastatin  (CRESTOR ) 10 MG tablet Take 1 tablet (10 mg total) by mouth daily.  . [DISCONTINUED] promethazine  (PHENERGAN ) 25 MG tablet Take 1 tablet (25 mg total) by mouth every 6 (six) hours as needed for nausea or vomiting.   No facility-administered encounter medications on file as of 05/12/2024.    Past Medical History:  Diagnosis Date  . Acute ankle pain 06/10/2018  . Asthma   . Capsulitis 11/28/2019  . Carpal tunnel syndrome of right wrist 02/27/2020  . Dysmenorrhea   . Encounter for orthopedic follow-up care 05/01/2021  . Essential hypertension 11/28/2019  . Foot pain 06/10/2018  . Ganglion cyst of dorsum of left wrist 11/28/2019  . History of left heart catheterization 05/19/2022  . Hypertension   . Other chest pain 05/19/2022  . PAD (peripheral artery disease) (HCC)   . Pain in elbow 11/28/2019  . Trigger thumb of right hand 03/31/2021    Past Surgical History:  Procedure Laterality Date  . ABDOMINAL AORTOGRAM W/LOWER EXTREMITY Bilateral 08/19/2020   Procedure: ABDOMINAL AORTOGRAM W/LOWER EXTREMITY;  Surgeon: Adine Hoof, MD;  Location: [facility] INVASIVE CV LAB;  Service: Cardiovascular;  Laterality: Bilateral;  . ABDOMINAL AORTOGRAM W/LOWER EXTREMITY N/A 12/28/2022   Procedure: ABDOMINAL AORTOGRAM W/LOWER EXTREMITY;  Surgeon: Adine Hoof, MD;  Location: [facility] INVASIVE CV LAB;  Service: Cardiovascular;  Laterality: N/A;  . CARDIAC CATHETERIZATION  05/19/2022  . CHOLECYSTECTOMY    . CYST EXCISION Left 12/08/2023   Procedure: CARPAL GANGLION CYST EXCISION;  Surgeon: Marilyn Shropshire, MD;  Location: [facility];  Service: Orthopedics;  Laterality: Left;  local  . PERIPHERAL VASCULAR INTERVENTION Left 08/19/2020   Procedure: PERIPHERAL VASCULAR INTERVENTION;  Surgeon: Adine Hoof, MD;  Location: [facility] INVASIVE CV LAB;  Service: Cardiovascular;   Laterality: Left;  Common iliac  . TUBAL LIGATION      Family History  Problem Relation Age of Onset  . Hypertension Other   . Cancer Other     Social History   Socioeconomic History  . Marital status: Single    Spouse name: Not on file  . Number of children: Not on file  . Years of education: Not on file  . Highest education level: Not on file  Occupational History  . Not on file  Tobacco Use  . Smoking status: Some Days    Types: Cigars    Start date: 1995    Passive exposure: Current  . Smokeless tobacco: Never  . Tobacco comments:    Hx of vaping.  Pt started smoking at age 69.  Smokes [employer]  Smokes about 5 cigars a day  Vaping Use  . Vaping status: Former  . Substances: Nicotine  Substance and Sexual Activity  . Alcohol use: No  . Drug use: Not Currently    Types: Marijuana    Comment: last used last week  . Sexual activity: Never    Birth control/protection: Surgical  Other Topics Concern  . Not on file  Social History Narrative  . Not on file   Social Drivers of Health   Financial Resource Strain: Low Risk  (12/23/2023)   Overall Financial Resource Strain (CARDIA)   . Difficulty of Paying Living Expenses: Not hard at all  Food Insecurity: No Food Insecurity (12/23/2023)   Hunger Vital Sign   . Worried About Programme researcher, broadcasting/film/video in the Last Year: Never true   . Ran Out of Food in the Last Year: Never true  Transportation Needs: No Transportation Needs (12/23/2023)   PRAPARE - Transportation   . Lack of Transportation (Medical): No   . Lack of Transportation (Non-Medical): No  Physical Activity: Sufficiently Active (12/23/2023)   Exercise Vital Sign   . Days of Exercise per Week: 7 days   . Minutes of Exercise per Session: 30 min  Stress: No Stress Concern Present (12/23/2023)   [employer] of Occupational Health - Occupational Stress Questionnaire   . Feeling of Stress : Not at all  Social Connections: Socially Isolated  (12/23/2023)   Social Connection and Isolation Panel   . Frequency of Communication with Friends and Family: More than three times a week   . Frequency of Social Gatherings with Friends and Family: More than three times a week   . Attends Religious Services: Never   . Active Member of Clubs or Organizations: No   . Attends Banker Meetings: Never   . Marital Status: Never married  Intimate Partner Violence: Not At Risk (12/23/2023)   Humiliation, Afraid, Rape, and Kick questionnaire   . Fear of Current or Ex-Partner: No   . Emotionally Abused: No   . Physically Abused: No   . Sexually Abused: No    ROS      Objective    BP (!) 146/92 (BP Location: Right Arm, Patient Position: Sitting, Cuff  Size: Large)   Pulse 82   Wt 187 lb (84.8 kg)   LMP  (LMP Unknown)   SpO2 96%   BMI 36.52 kg/m   Physical Exam  {Labs (Optional):23779}    Assessment & Plan:   There are no diagnoses linked to this encounter.   No follow-ups on file.   Arlo Lama, MD

## 2024-05-15 ENCOUNTER — Encounter: Payer: Self-pay | Admitting: Family Medicine

## 2024-06-16 ENCOUNTER — Ambulatory Visit: Admitting: Family Medicine

## 2024-06-19 ENCOUNTER — Encounter (HOSPITAL_BASED_OUTPATIENT_CLINIC_OR_DEPARTMENT_OTHER): Payer: Self-pay | Admitting: Emergency Medicine

## 2024-06-19 ENCOUNTER — Emergency Department (HOSPITAL_BASED_OUTPATIENT_CLINIC_OR_DEPARTMENT_OTHER): Admission: EM | Admit: 2024-06-19 | Discharge: 2024-06-19 | Disposition: A | Discharge status: Home / Self Care

## 2024-06-19 ENCOUNTER — Other Ambulatory Visit: Payer: Self-pay

## 2024-06-19 ENCOUNTER — Emergency Department (HOSPITAL_BASED_OUTPATIENT_CLINIC_OR_DEPARTMENT_OTHER)

## 2024-06-19 ENCOUNTER — Emergency Department (HOSPITAL_COMMUNITY)

## 2024-06-19 DIAGNOSIS — Z7901 Long term (current) use of anticoagulants: Secondary | ICD-10-CM | POA: Insufficient documentation

## 2024-06-19 DIAGNOSIS — Z7982 Long term (current) use of aspirin: Secondary | ICD-10-CM | POA: Diagnosis not present

## 2024-06-19 DIAGNOSIS — J069 Acute upper respiratory infection, unspecified: Secondary | ICD-10-CM | POA: Diagnosis not present

## 2024-06-19 DIAGNOSIS — J45901 Unspecified asthma with (acute) exacerbation: Secondary | ICD-10-CM | POA: Insufficient documentation

## 2024-06-19 DIAGNOSIS — R059 Cough, unspecified: Secondary | ICD-10-CM | POA: Diagnosis not present

## 2024-06-19 DIAGNOSIS — B9789 Other viral agents as the cause of diseases classified elsewhere: Secondary | ICD-10-CM | POA: Diagnosis not present

## 2024-06-19 LAB — RESP PANEL BY RT-PCR (RSV, FLU A&B, COVID)  RVPGX2
Influenza A by PCR: NEGATIVE
Influenza B by PCR: NEGATIVE
Resp Syncytial Virus by PCR: NEGATIVE
SARS Coronavirus 2 by RT PCR: NEGATIVE

## 2024-06-19 MED ORDER — METHYLPREDNISOLONE SODIUM SUCC 125 MG IJ SOLR
125.0000 mg | Freq: Once | INTRAMUSCULAR | Status: AC
  Administered 2024-06-19: 125 mg via INTRAMUSCULAR
  Filled 2024-06-19: qty 2

## 2024-06-19 MED ORDER — PREDNISONE 10 MG PO TABS: 40.0000 mg | ORAL_TABLET | Freq: Every day | ORAL | 0 refills | Status: AC

## 2024-06-19 NOTE — Discharge Instructions (Signed)
 You can use any over-the-counter medication as needed for your symptoms.  Use Tylenol  Motrin  as needed for pain and fever.  Use your albuterol  as needed for difficulty breathing and take your steroids as prescribed.

## 2024-06-19 NOTE — ED Triage Notes (Signed)
 Pt reports cough, fever, body aches, chills since Sat  Also c/o HA since a fall 3 weeks ago

## 2024-06-20 ENCOUNTER — Ambulatory Visit: Payer: Self-pay

## 2024-06-20 NOTE — ED Provider Notes (Signed)
 [name] Provider Note   CSN: [id number] Arrival date & time: 06/19/24  1951     Patient presents with: Cough   Janice French is a 43 y.o. female.   43 year old female presents for evaluation of cough and fever.  States her symptoms started 2 days ago.  She is here with her grandson with the same symptoms.  She is not taking anything for her symptoms but does have a history of asthma.  States has been using her albuterol  inhaler and is not helping.  Denies any other symptoms or concerns at this time.   Cough Associated symptoms: fever   Associated symptoms: no chest pain, no chills, no ear pain, no rash, no shortness of breath and no sore throat        Prior to Admission medications   Medication Sig Start Date End Date Taking? Authorizing Provider  predniSONE  (DELTASONE ) 10 MG tablet Take 4 tablets (40 mg total) by mouth daily for 4 days. 06/19/24 06/23/24 Yes Suly Vukelich L, DO  acetaminophen  (TYLENOL ) 500 MG tablet Take 1 tablet (500 mg total) by mouth every 6 (six) hours as needed. 01/30/23   Nivia Colon, PA-C  albuterol  (VENTOLIN  HFA) 108 (90 Base) MCG/ACT inhaler Inhale 2 puffs into the lungs every 6 (six) hours as needed for wheezing or shortness of breath. 01/29/23   Kara Dorn NOVAK, MD  albuterol  (VENTOLIN  HFA) 108 (90 Base) MCG/ACT inhaler Inhale 2 puffs into the lungs every 4 (four) hours as needed for wheezing or shortness of breath. 04/24/24   Ula Prentice SAUNDERS, MD  Albuterol -Budesonide (AIRSUPRA ) 90-80 MCG/ACT AERO Inhale 90 mg into the lungs 3 (three) times daily as needed. 05/12/24   Tanda Bleacher, MD  amLODipine  (NORVASC ) 10 MG tablet Take 1 tablet (10 mg total) by mouth daily. 03/03/22   Smoot, Lauraine LABOR, PA-C  aspirin  EC 81 MG tablet Take 1 tablet (81 mg total) by mouth daily. Swallow whole. 02/23/24   Schuh, McKenzi P, PA-C  benzonatate  (TESSALON ) 100 MG capsule Take 1 capsule (100 mg total) by mouth every 8 (eight) hours.  01/30/23   Nivia Colon, PA-C  Capsaicin-Menthol-Methyl Sal (CAPSAICIN-METHYL SAL-MENTHOL) 0.025-1-12 % CREA Apply 1 application topically every 6 (six) hours as needed. Rubbed onto your abdomen every 6 hours if needed for pain and nausea 06/23/19   Armenta Canning, MD  clopidogrel  (PLAVIX ) 75 MG tablet TAKE 1 TABLET BY MOUTH EVERY DAY 12/10/23   Sheree Penne Bruckner, MD  fluticasone  (FLOVENT  HFA) 110 MCG/ACT inhaler Inhale 1 puff into the lungs 2 (two) times daily. 05/12/24   Tanda Bleacher, MD  fluticasone -salmeterol (ADVAIR HFA) 115-21 MCG/ACT inhaler Inhale 2 puffs into the lungs 2 (two) times daily. 10/29/22   Kara Dorn NOVAK, MD  hydrochlorothiazide  (HYDRODIURIL ) 25 MG tablet Take 1 tablet (25 mg total) by mouth daily. 03/04/22   Mayers, Cari S, PA-C  lisinopril  (ZESTRIL ) 10 MG tablet Take 1 tablet (10 mg total) by mouth daily. 12/27/23   Tanda Bleacher, MD  metoprolol  succinate (TOPROL -XL) 25 MG 24 hr tablet Take 25 mg by mouth daily. 07/08/22   [provider]  naproxen  (NAPROSYN ) 500 MG tablet Take 1 tablet (500 mg total) by mouth 2 (two) times daily. 04/24/24   Ula Prentice SAUNDERS, MD  nitroGLYCERIN  (NITROSTAT ) 0.4 MG SL tablet PLACE 1 TABLET UNDER THE TONGUE EVERY 5 MINUTES AS NEEDED 03/23/23   Revankar, Jennifer SAUNDERS, MD  omeprazole  (PRILOSEC) 20 MG capsule Take 1 capsule (20 mg total)  by mouth daily. 03/04/22   Mayers, Cari S, PA-C  rosuvastatin  (CRESTOR ) 10 MG tablet Take 1 tablet (10 mg total) by mouth daily. 02/23/24   Elna Ahmed SQUIBB, PA-C  Spacer/Aero-Holding Raguel FRENCH Utilize with MDI as needed - BID_TID 05/12/24   Tanda Bleacher, MD  promethazine  (PHENERGAN ) 25 MG tablet Take 1 tablet (25 mg total) by mouth every 6 (six) hours as needed for nausea or vomiting. 06/23/19 08/06/20  Armenta Canning, MD    Allergies: Patient has no known allergies.    Review of Systems  Constitutional:  Positive for fever. Negative for chills.  HENT:  Negative for ear pain and sore throat.   Eyes:  Negative  for pain and visual disturbance.  Respiratory:  Positive for cough. Negative for shortness of breath.   Cardiovascular:  Negative for chest pain and palpitations.  Gastrointestinal:  Negative for abdominal pain and vomiting.  Genitourinary:  Negative for dysuria and hematuria.  Musculoskeletal:  Negative for arthralgias and back pain.  Skin:  Negative for color change and rash.  Neurological:  Negative for seizures and syncope.  All other systems reviewed and are negative.   Updated Vital Signs BP (!) 140/84   Pulse 98   Temp 99.3 F (37.4 C) (Oral)   Resp 20   Ht 5' (1.524 m)   Wt 81.6 kg   SpO2 100%   BMI 35.15 kg/m   Physical Exam Vitals and nursing note reviewed.  Constitutional:      General: She is not in acute distress.    Appearance: Normal appearance. She is well-developed. She is not ill-appearing.  HENT:     Head: Normocephalic and atraumatic.  Eyes:     Conjunctiva/sclera: Conjunctivae normal.  Cardiovascular:     Rate and Rhythm: Normal rate and regular rhythm.     Heart sounds: No murmur heard. Pulmonary:     Effort: Pulmonary effort is normal. No respiratory distress.     Breath sounds: Wheezing present.  Abdominal:     Palpations: Abdomen is soft.     Tenderness: There is no abdominal tenderness.  Musculoskeletal:        General: No swelling.     Cervical back: Neck supple.  Skin:    General: Skin is warm and dry.     Capillary Refill: Capillary refill takes less than 2 seconds.  Neurological:     Mental Status: She is alert.  Psychiatric:        Mood and Affect: Mood normal.     (all labs ordered are listed, but only abnormal results are displayed) Labs Reviewed  RESP PANEL BY RT-PCR (RSV, FLU A&B, COVID)  [id number]    EKG: None  Radiology: DG Chest 2 View Result Date: 06/19/2024 CLINICAL DATA:  Cough since last Wednesday or Thursday. Shortness of breath. EXAM: CHEST - 2 VIEW COMPARISON:  04/24/2024 FINDINGS: Normal heart size and  pulmonary vascularity. No focal airspace disease or consolidation in the lungs. No blunting of costophrenic angles. No pneumothorax. Mediastinal contours appear intact. Soft tissue attenuation over the lung bases. Surgical clips in the right upper quadrant. IMPRESSION: No active cardiopulmonary disease. Electronically Signed   By: Elsie Gravely M.D.   On: 06/19/2024 20:52     Procedures   Medications Ordered in the ED  methylPREDNISolone  sodium succinate (SOLU-MEDROL ) 125 mg/2 mL injection 125 mg (125 mg Intramuscular Given 06/19/24 2139)  Medical Decision Making Patient afebrile here with mild wheezing on exam.  Will treat her with steroids and give her 4 more days of steroids as a prescription.  COVID flu and RSV are negative.  Chest x-ray was ordered and reviewed and unremarkable, there is no evidence of pneumonia.  Advised Tylenol  Motrin  as needed for pain and any other over-the-counter medications as needed for her symptoms.  She feels comfortable with the plan to be discharged home.  Problems Addressed: Moderate asthma with exacerbation, unspecified whether persistent: acute illness or injury Viral URI with cough: self-limited or minor problem  Amount and/or Complexity of Data Reviewed External Data Reviewed: notes.    Details: Prior ED records reviewed and patient was seen in the ER in early June for chest pain with negative workup Labs: ordered. Decision-making details documented in ED Course.    Details: Ordered and reviewed COVID flu and RSV are negative Radiology: ordered and independent interpretation performed. Decision-making details documented in ED Course.    Details: Ordered and interpreted by me independently of radiology Chest x-ray: Shows no pneumonia or acute abnormality in the chest  Risk OTC drugs. Prescription drug management.    Final diagnoses:  Moderate asthma with exacerbation, unspecified whether persistent  Viral  URI with cough    ED Discharge Orders          Ordered    predniSONE  (DELTASONE ) 10 MG tablet  Daily        06/19/24 2123               Briahnna Harries L, DO 06/20/24 [phone]

## 2024-06-20 NOTE — Telephone Encounter (Signed)
 FYI Only or Action Required?: FYI only for provider.  Patient was last seen in primary care on 05/12/2024 by Tanda Bleacher, MD.  Called Nurse Triage reporting Head Injury.  Symptoms began several weeks ago.  Interventions attempted: OTC medications: tylenol .  Symptoms are: unchanged.  Triage Disposition: No disposition on file.  Patient/caregiver understands and will follow disposition?: YesCopied from CRM [id number]. Topic: Clinical - Red Word Triage >> Jun 20, 2024  3:05 PM Winona R wrote: Pt fell off a swing face first and has had a headache since then that comes and goes but it is extremely painful and has been consistent today. Answer Assessment - Initial Assessment Questions 1. MECHANISM: How did the injury happen? For falls, ask: What height did you fall from? and What surface did you fall against?      Pt fell of swing 3 weeks ago and fell on face 2. ONSET: When did the injury happen? (e.g., minutes, hours ago)      3 weeks  3. NEUROLOGIC SYMPTOMS: Was there any loss of consciousness? Are there any other neurological symptoms?      Not sure 4. MENTAL STATUS: Does the person know who they are, who you are, and where they are?      A&O x3 5. LOCATION: What part of the head was hit?      face 6. SCALP APPEARANCE: What does the scalp look like? Is it bleeding now? If Yes, ask: Is it difficult to stop?      Na  7. SIZE: For cuts, bruises, or swelling, ask: How large is it? (e.g., inches or centimeters)      Swelling under eyes 8. PAIN: Is there any pain? If Yes, ask: How bad is it? (Scale 0-10; or none, mild, moderate, severe)     10  10. BLOOD THINNERS: Do you take any blood thinners? (e.g., aspirin , clopidogrel  / Plavix , coumadin , heparin ). Notes: Other strong blood thinners include: Arixtra (fondaparinux), Eliquis (apixaban), Pradaxa (dabigatran), and Xarelto (rivaroxaban).       plavix  11. OTHER SYMPTOMS: Do you have any other symptoms? (e.g.,  neck pain, vomiting)       Cough, blurry vision, nausea, confusion    Pt hit head face first off of swing 3 weeks ago. Pt stated she is not sure she lost consciousness but laid there for some time. Face was swollen under eyes but not gone away. RN advised ED. Daughter going to take pt now. RN advised pt to call back and set up f/u appt.  Protocols used: Head Injury-A-AH

## 2024-06-22 NOTE — Telephone Encounter (Signed)
 Tried to call patient, phone number listed in chart not in service

## 2024-07-13 ENCOUNTER — Other Ambulatory Visit: Payer: Self-pay

## 2024-07-13 DIAGNOSIS — I771 Stricture of artery: Secondary | ICD-10-CM

## 2024-07-13 DIAGNOSIS — I739 Peripheral vascular disease, unspecified: Secondary | ICD-10-CM

## 2024-08-06 DIAGNOSIS — R112 Nausea with vomiting, unspecified: Secondary | ICD-10-CM | POA: Diagnosis not present

## 2024-08-06 DIAGNOSIS — R42 Dizziness and giddiness: Secondary | ICD-10-CM | POA: Diagnosis not present

## 2024-08-06 DIAGNOSIS — F17210 Nicotine dependence, cigarettes, uncomplicated: Secondary | ICD-10-CM | POA: Diagnosis not present

## 2024-08-06 DIAGNOSIS — R197 Diarrhea, unspecified: Secondary | ICD-10-CM | POA: Diagnosis not present

## 2024-08-06 DIAGNOSIS — T50904A Poisoning by unspecified drugs, medicaments and biological substances, undetermined, initial encounter: Secondary | ICD-10-CM | POA: Diagnosis not present

## 2024-08-06 DIAGNOSIS — R1084 Generalized abdominal pain: Secondary | ICD-10-CM | POA: Diagnosis not present

## 2024-08-23 ENCOUNTER — Ambulatory Visit

## 2024-08-23 ENCOUNTER — Ambulatory Visit (HOSPITAL_COMMUNITY)
Admission: RE | Admit: 2024-08-23 | Discharge: 2024-08-23 | Disposition: A | Discharge status: Home / Self Care | Source: Ambulatory Visit | Attending: Vascular Surgery | Admitting: Vascular Surgery

## 2024-08-23 ENCOUNTER — Ambulatory Visit (HOSPITAL_BASED_OUTPATIENT_CLINIC_OR_DEPARTMENT_OTHER)
Admission: RE | Admit: 2024-08-23 | Discharge: 2024-08-23 | Disposition: A | Discharge status: Home / Self Care | Source: Ambulatory Visit | Attending: Vascular Surgery | Admitting: Vascular Surgery

## 2024-08-23 VITALS — BP 197/113 | HR 78 | Temp 98.0°F | Resp 18 | Ht 60.0 in | Wt 186.5 lb

## 2024-08-23 DIAGNOSIS — I739 Peripheral vascular disease, unspecified: Secondary | ICD-10-CM

## 2024-08-23 DIAGNOSIS — I771 Stricture of artery: Secondary | ICD-10-CM

## 2024-08-23 DIAGNOSIS — F17200 Nicotine dependence, unspecified, uncomplicated: Secondary | ICD-10-CM

## 2024-08-23 LAB — VAS US ABI WITH/WO TBI
Left ABI: 0.93
Right ABI: 0.89

## 2024-08-23 NOTE — Progress Notes (Signed)
 Office Note     CC:  follow up Requesting Provider:  Tanda Bleacher, MD  HPI: Janice French is a 43 y.o. (August 21, 1981) female who presents for surveillance of PAD.  She has history of a left common to external iliac artery stenting on 08/19/2020 by Dr. Sheree.  This was performed due to critical limb ischemia with rest pain.  She required restenting of the left iliac due to in-stent stenosis on 12/28/2022.  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  She denies any rest pain or tissue loss.  She is taking aspirin , Plavix , statin daily.  She continues to smoke on a daily basis however has cut down drastically.   Past Medical History:  Diagnosis Date   Acute ankle pain 06/10/2018   Asthma    Capsulitis 11/28/2019   Carpal tunnel syndrome of right wrist 02/27/2020   Dysmenorrhea    Encounter for orthopedic follow-up care 05/01/2021   Essential hypertension 11/28/2019   Foot pain 06/10/2018   Ganglion cyst of dorsum of left wrist 11/28/2019   History of left heart catheterization 05/19/2022   Hypertension    Other chest pain 05/19/2022   PAD (peripheral artery disease)    Pain in elbow 11/28/2019   Trigger thumb of right hand 03/31/2021    Past Surgical History:  Procedure Laterality Date   ABDOMINAL AORTOGRAM W/LOWER EXTREMITY Bilateral 08/19/2020   Procedure: ABDOMINAL AORTOGRAM W/LOWER EXTREMITY;  Surgeon: Sheree Penne Bruckner, MD;  Location: [facility] INVASIVE CV LAB;  Service: Cardiovascular;  Laterality: Bilateral;   ABDOMINAL AORTOGRAM W/LOWER EXTREMITY N/A 12/28/2022   Procedure: ABDOMINAL AORTOGRAM W/LOWER EXTREMITY;  Surgeon: Sheree Penne Bruckner, MD;  Location: [facility] INVASIVE CV LAB;  Service: Cardiovascular;  Laterality: N/A;   CARDIAC CATHETERIZATION  05/19/2022   CHOLECYSTECTOMY     CYST EXCISION Left 12/08/2023   Procedure: CARPAL GANGLION CYST EXCISION;  Surgeon: Romona Harari, MD;   Location: [facility];  Service: Orthopedics;  Laterality: Left;  local   PERIPHERAL VASCULAR INTERVENTION Left 08/19/2020   Procedure: PERIPHERAL VASCULAR INTERVENTION;  Surgeon: Sheree Penne Bruckner, MD;  Location: [facility] INVASIVE CV LAB;  Service: Cardiovascular;  Laterality: Left;  Common iliac   TUBAL LIGATION      Social History   Socioeconomic History   Marital status: Single    Spouse name: Not on file   Number of children: Not on file   Years of education: Not on file   Highest education level: Not on file  Occupational History   Not on file  Tobacco Use   Smoking status: Some Days    Types: Cigars    Start date: 1995    Passive exposure: Current   Smokeless tobacco: Never   Tobacco comments:    Hx of vaping.  Pt started smoking at age 21.  Smokes [employer]  Smokes about 5 cigars a day  Vaping Use   Vaping status: Former   Substances: Nicotine  Substance and Sexual Activity   Alcohol use: No   Drug use: Not Currently    Types: Marijuana    Comment: last used last week   Sexual activity: Never    Birth control/protection: Surgical  Other Topics Concern   Not on file  Social History Narrative   Not on file   Social Drivers of Health   Financial Resource Strain: Low Risk  (12/23/2023)   Overall Physicist, medical Strain (  CARDIA)    Difficulty of Paying Living Expenses: Not hard at all  Food Insecurity: No Food Insecurity (12/23/2023)   Hunger Vital Sign    Worried About Running Out of Food in the Last Year: Never true    Ran Out of Food in the Last Year: Never true  Transportation Needs: No Transportation Needs (12/23/2023)   PRAPARE - Administrator, Civil Service (Medical): No    Lack of Transportation (Non-Medical): No  Physical Activity: Sufficiently Active (12/23/2023)   Exercise Vital Sign    Days of Exercise per Week: 7 days    Minutes of Exercise per Session: 30 min  Stress: No Stress Concern Present (12/23/2023)    [employer] of Occupational Health - Occupational Stress Questionnaire    Feeling of Stress : Not at all  Social Connections: Socially Isolated (12/23/2023)   Social Connection and Isolation Panel    Frequency of Communication with Friends and Family: More than three times a week    Frequency of Social Gatherings with Friends and Family: More than three times a week    Attends Religious Services: Never    Database administrator or Organizations: No    Attends Banker Meetings: Never    Marital Status: Never married  Intimate Partner Violence: Not At Risk (12/23/2023)   Humiliation, Afraid, Rape, and Kick questionnaire    Fear of Current or Ex-Partner: No    Emotionally Abused: No    Physically Abused: No    Sexually Abused: No    Family History  Problem Relation Age of Onset   Hypertension Other    Cancer Other     Current Outpatient Medications  Medication Sig Dispense Refill   acetaminophen  (TYLENOL ) 500 MG tablet Take 1 tablet (500 mg total) by mouth every 6 (six) hours as needed. 30 tablet 0   albuterol  (VENTOLIN  HFA) 108 (90 Base) MCG/ACT inhaler Inhale 2 puffs into the lungs every 6 (six) hours as needed for wheezing or shortness of breath. 8 g 6   albuterol  (VENTOLIN  HFA) 108 (90 Base) MCG/ACT inhaler Inhale 2 puffs into the lungs every 4 (four) hours as needed for wheezing or shortness of breath. 1 each 0   Albuterol -Budesonide (AIRSUPRA ) 90-80 MCG/ACT AERO Inhale 90 mg into the lungs 3 (three) times daily as needed. 10.7 g 5   amLODipine  (NORVASC ) 10 MG tablet Take 1 tablet (10 mg total) by mouth daily. 30 tablet 1   aspirin  EC 81 MG tablet Take 1 tablet (81 mg total) by mouth daily. Swallow whole. 30 tablet 12   benzonatate  (TESSALON ) 100 MG capsule Take 1 capsule (100 mg total) by mouth every 8 (eight) hours. 21 capsule 0   Capsaicin-Menthol-Methyl Sal (CAPSAICIN-METHYL SAL-MENTHOL) 0.025-1-12 % CREA Apply 1 application topically every 6 (six) hours as  needed. Rubbed onto your abdomen every 6 hours if needed for pain and nausea 56.6 g 1   clopidogrel  (PLAVIX ) 75 MG tablet TAKE 1 TABLET BY MOUTH EVERY DAY 90 tablet 3   fluticasone  (FLOVENT  HFA) 110 MCG/ACT inhaler Inhale 1 puff into the lungs 2 (two) times daily. 1 each 12   fluticasone -salmeterol (ADVAIR HFA) 115-21 MCG/ACT inhaler Inhale 2 puffs into the lungs 2 (two) times daily. 1 each 12   hydrochlorothiazide  (HYDRODIURIL ) 25 MG tablet Take 1 tablet (25 mg total) by mouth daily. 30 tablet 1   lisinopril  (ZESTRIL ) 10 MG tablet Take 1 tablet (10 mg total) by mouth daily. 90 tablet 0  metoprolol  succinate (TOPROL -XL) 25 MG 24 hr tablet Take 25 mg by mouth daily.     naproxen  (NAPROSYN ) 500 MG tablet Take 1 tablet (500 mg total) by mouth 2 (two) times daily. 15 tablet 0   nitroGLYCERIN  (NITROSTAT ) 0.4 MG SL tablet PLACE 1 TABLET UNDER THE TONGUE EVERY 5 MINUTES AS NEEDED 25 tablet 6   omeprazole  (PRILOSEC) 20 MG capsule Take 1 capsule (20 mg total) by mouth daily. 30 capsule 3   rosuvastatin  (CRESTOR ) 10 MG tablet Take 1 tablet (10 mg total) by mouth daily. 30 tablet 12   Spacer/Aero-Holding Raguel FRENCH Utilize with MDI as needed - BID_TID 1 each 0   No current facility-administered medications for this visit.    No Known Allergies   REVIEW OF SYSTEMS:  Negative unless noted in HPI [X]  denotes positive finding, [ ]  denotes negative finding Cardiac  Comments:  Chest pain or chest pressure:    Shortness of breath upon exertion:    Short of breath when lying flat:    Irregular heart rhythm:        Vascular    Pain in calf, thigh, or hip brought on by ambulation:    Pain in feet at night that wakes you up from your sleep:     Blood clot in your veins:    Leg swelling:         Pulmonary    Oxygen at home:    Productive cough:     Wheezing:         Neurologic    Sudden weakness in arms or legs:     Sudden numbness in arms or legs:     Sudden onset of difficulty speaking or  slurred speech:    Temporary loss of vision in one eye:     Problems with dizziness:         Gastrointestinal    Blood in stool:     Vomited blood:         Genitourinary    Burning when urinating:     Blood in urine:        Psychiatric    Major depression:         Hematologic    Bleeding problems:    Problems with blood clotting too easily:        Skin    Rashes or ulcers:        Constitutional    Fever or chills:      PHYSICAL EXAMINATION:  Vitals:   08/23/24 0851  BP: (!) 197/113  Pulse: 78  Resp: 18  Temp: 98 F (36.7 C)  TempSrc: Temporal  SpO2: 99%  Weight: 186 lb 8 oz (84.6 kg)  Height: 5' (1.524 m)    General:  WDWN in NAD; vital signs documented above Gait: Not observed HENT: WNL, normocephalic Pulmonary: normal non-labored breathing Cardiac: regular HR Abdomen: soft, NT, no masses Skin: without rashes Vascular Exam/Pulses: absent pedal pulses Extremities: without ischemic changes, without Gangrene , without cellulitis; without open wounds;  Musculoskeletal: no muscle wasting or atrophy  Neurologic: A&O X 3 Psychiatric:  The pt has Normal affect.   Non-Invasive Vascular Imaging:    Left iliac stents widely patent on duplex  ABI/TBIToday's ABIToday's TBIPrevious ABIPrevious TBI  +-------+-----------+-----------+------------+------------+  Right 0.89       0.68       0.75        0.57          +-------+-----------+-----------+------------+------------+  Left  0.93  0.53       0.98        0.52          +-------+-----------+-----------+------------+------------+      ASSESSMENT/PLAN:: 43 y.o. female here for follow up for surveillance of PAD with history of left iliac stenting  Bilateral lower extremities are well-perfused on exam.  Iliac stents are widely patent based on duplex.  Her ABIs and TBI's are stable.  She will continue her aspirin , Plavix , statin daily.  I encouraged smoking cessation.  We will repeat  aortoiliac duplex and ABIs in 1 year.  She knows to call/return sooner with any questions or concerns.   Donnice Sender, PA-C [facility] [phone]  Clinic MD:   Sheree

## 2024-09-08 NOTE — Progress Notes (Signed)
 Prarthana Parlin                                          MRN: [id number]   09/08/2024   The [facility] Quality Team Specialist reviewed this patient medical record for the purposes of chart review for care gap closure. The following were reviewed: chart review for care gap closure-controlling blood pressure.    [facility] Quality Team

## 2024-10-26 ENCOUNTER — Emergency Department (HOSPITAL_BASED_OUTPATIENT_CLINIC_OR_DEPARTMENT_OTHER)

## 2024-10-26 ENCOUNTER — Emergency Department (HOSPITAL_BASED_OUTPATIENT_CLINIC_OR_DEPARTMENT_OTHER)
Admission: EM | Admit: 2024-10-26 | Discharge: 2024-10-26 | Disposition: A | Discharge status: Home / Self Care | Payer: Worker's Compensation | Attending: Emergency Medicine | Admitting: Emergency Medicine

## 2024-10-26 ENCOUNTER — Other Ambulatory Visit: Payer: Self-pay

## 2024-10-26 ENCOUNTER — Encounter (HOSPITAL_BASED_OUTPATIENT_CLINIC_OR_DEPARTMENT_OTHER): Payer: Self-pay | Admitting: Emergency Medicine

## 2024-10-26 DIAGNOSIS — Z7982 Long term (current) use of aspirin: Secondary | ICD-10-CM | POA: Insufficient documentation

## 2024-10-26 DIAGNOSIS — M79642 Pain in left hand: Secondary | ICD-10-CM | POA: Diagnosis not present

## 2024-10-26 DIAGNOSIS — Z7902 Long term (current) use of antithrombotics/antiplatelets: Secondary | ICD-10-CM | POA: Diagnosis not present

## 2024-10-26 DIAGNOSIS — Z79899 Other long term (current) drug therapy: Secondary | ICD-10-CM | POA: Insufficient documentation

## 2024-10-26 DIAGNOSIS — M778 Other enthesopathies, not elsewhere classified: Secondary | ICD-10-CM

## 2024-10-26 DIAGNOSIS — M65332 Trigger finger, left middle finger: Secondary | ICD-10-CM | POA: Insufficient documentation

## 2024-10-26 DIAGNOSIS — I10 Essential (primary) hypertension: Secondary | ICD-10-CM | POA: Insufficient documentation

## 2024-10-26 DIAGNOSIS — M67834 Other specified disorders of tendon, left wrist: Secondary | ICD-10-CM | POA: Diagnosis not present

## 2024-10-26 MED ORDER — ACETAMINOPHEN 500 MG PO TABS
1000.0000 mg | ORAL_TABLET | Freq: Once | ORAL | Status: AC
  Administered 2024-10-26: 1000 mg via ORAL
  Filled 2024-10-26: qty 2

## 2024-10-26 MED ORDER — IBUPROFEN 800 MG PO TABS
800.0000 mg | ORAL_TABLET | Freq: Once | ORAL | Status: AC
  Administered 2024-10-26: 800 mg via ORAL
  Filled 2024-10-26: qty 1

## 2024-10-26 MED ORDER — IBUPROFEN 600 MG PO TABS: 600.0000 mg | ORAL_TABLET | Freq: Four times a day (QID) | ORAL | 0 refills | Status: AC | PRN

## 2024-10-26 NOTE — ED Triage Notes (Addendum)
 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  Denies specific known injury. Swelling noted to L wrist.   Pt reports she did not take bp meds today, compliant otherwise.

## 2024-10-26 NOTE — ED Provider Notes (Signed)
 [place] [facility] Provider Note   CSN: [id number] Arrival date & time: 10/26/24  2224     Patient presents with: Hand Pain   Janice French is a 43 y.o. female.   Patient presents to the emergency department for evaluation of left hand pain.  Patient reports that she is having pain in the left wrist and lateral portion of the hand.  Patient reports movement exacerbates the pain.  She does repetitive motion at work.  No direct injury.  She has also noticed that her middle finger is clicking.  She reports that she has had prior surgery for trigger finger.       Prior to Admission medications   Medication Sig Start Date End Date Taking? Authorizing Provider  ibuprofen  (ADVIL ) 600 MG tablet Take 1 tablet (600 mg total) by mouth every 6 (six) hours as needed. 10/26/24  Yes Maylani Embree, Lonni PARAS, MD  acetaminophen  (TYLENOL ) 500 MG tablet Take 1 tablet (500 mg total) by mouth every 6 (six) hours as needed. 01/30/23   Nivia Colon, PA-C  albuterol  (VENTOLIN  HFA) 108 (90 Base) MCG/ACT inhaler Inhale 2 puffs into the lungs every 6 (six) hours as needed for wheezing or shortness of breath. 01/29/23   Kara Dorn NOVAK, MD  albuterol  (VENTOLIN  HFA) 108 (90 Base) MCG/ACT inhaler Inhale 2 puffs into the lungs every 4 (four) hours as needed for wheezing or shortness of breath. 04/24/24   Ula Prentice SAUNDERS, MD  Albuterol -Budesonide (AIRSUPRA ) 90-80 MCG/ACT AERO Inhale 90 mg into the lungs 3 (three) times daily as needed. 05/12/24   Tanda Bleacher, MD  amLODipine  (NORVASC ) 10 MG tablet Take 1 tablet (10 mg total) by mouth daily. 03/03/22   Smoot, Lauraine LABOR, PA-C  aspirin  EC 81 MG tablet Take 1 tablet (81 mg total) by mouth daily. Swallow whole. 02/23/24   Schuh, McKenzi P, PA-C  benzonatate  (TESSALON ) 100 MG capsule Take 1 capsule (100 mg total) by mouth every 8 (eight) hours. 01/30/23   Nivia Colon, PA-C  Capsaicin-Menthol-Methyl Sal (CAPSAICIN-METHYL SAL-MENTHOL) 0.025-1-12 %  CREA Apply 1 application topically every 6 (six) hours as needed. Rubbed onto your abdomen every 6 hours if needed for pain and nausea 06/23/19   Armenta Canning, MD  clopidogrel  (PLAVIX ) 75 MG tablet TAKE 1 TABLET BY MOUTH EVERY DAY 12/10/23   Sheree Penne Lonni, MD  fluticasone  (FLOVENT  HFA) 110 MCG/ACT inhaler Inhale 1 puff into the lungs 2 (two) times daily. 05/12/24   Tanda Bleacher, MD  fluticasone -salmeterol (ADVAIR HFA) 115-21 MCG/ACT inhaler Inhale 2 puffs into the lungs 2 (two) times daily. 10/29/22   Kara Dorn NOVAK, MD  hydrochlorothiazide  (HYDRODIURIL ) 25 MG tablet Take 1 tablet (25 mg total) by mouth daily. 03/04/22   Mayers, Cari S, PA-C  lisinopril  (ZESTRIL ) 10 MG tablet Take 1 tablet (10 mg total) by mouth daily. 12/27/23   Tanda Bleacher, MD  metoprolol  succinate (TOPROL -XL) 25 MG 24 hr tablet Take 25 mg by mouth daily. 07/08/22   [provider]  naproxen  (NAPROSYN ) 500 MG tablet Take 1 tablet (500 mg total) by mouth 2 (two) times daily. 04/24/24   Ula Prentice SAUNDERS, MD  nitroGLYCERIN  (NITROSTAT ) 0.4 MG SL tablet PLACE 1 TABLET UNDER THE TONGUE EVERY 5 MINUTES AS NEEDED 03/23/23   Revankar, Jennifer SAUNDERS, MD  omeprazole  (PRILOSEC) 20 MG capsule Take 1 capsule (20 mg total) by mouth daily. 03/04/22   Mayers, Cari S, PA-C  rosuvastatin  (CRESTOR ) 10 MG tablet Take 1 tablet (10 mg  total) by mouth daily. 02/23/24   Elna Ahmed SQUIBB, PA-C  Spacer/Aero-Holding Raguel FRENCH Utilize with MDI as needed - BID_TID 05/12/24   Tanda Bleacher, MD  promethazine  (PHENERGAN ) 25 MG tablet Take 1 tablet (25 mg total) by mouth every 6 (six) hours as needed for nausea or vomiting. 06/23/19 08/06/20  Armenta Canning, MD    Allergies: Patient has no known allergies.    Review of Systems  Updated Vital Signs BP (!) 213/106   Pulse 88   Temp 98.3 F (36.8 C)   Resp 15   Ht 5' (1.524 m)   Wt 81.6 kg   SpO2 99%   BMI 35.15 kg/m   Physical Exam Vitals and nursing note reviewed.  Constitutional:       General: She is not in acute distress.    Appearance: She is well-developed.  HENT:     Head: Normocephalic and atraumatic.     Mouth/Throat:     Mouth: Mucous membranes are moist.  Eyes:     General: Vision grossly intact. Gaze aligned appropriately.     Extraocular Movements: Extraocular movements intact.     Conjunctiva/sclera: Conjunctivae normal.  Cardiovascular:     Rate and Rhythm: Normal rate and regular rhythm.     Pulses: Normal pulses.     Heart sounds: Normal heart sounds, S1 normal and S2 normal. No murmur heard.    No friction rub. No gallop.  Pulmonary:     Effort: Pulmonary effort is normal. No respiratory distress.     Breath sounds: Normal breath sounds.  Abdominal:     General: Bowel sounds are normal.     Palpations: Abdomen is soft.     Tenderness: There is no abdominal tenderness. There is no guarding or rebound.     Hernia: No hernia is present.  Musculoskeletal:        General: No swelling.     Left wrist: Tenderness (Lateral) present. No swelling or deformity.     Left hand: Tenderness (Fifth metacarpal) present. No swelling or deformity.     Cervical back: Full passive range of motion without pain, normal range of motion and neck supple. No spinous process tenderness or muscular tenderness. Normal range of motion.     Right lower leg: No edema.     Left lower leg: No edema.  Skin:    General: Skin is warm and dry.     Capillary Refill: Capillary refill takes less than 2 seconds.     Findings: No ecchymosis, erythema, rash or wound.  Neurological:     General: No focal deficit present.     Mental Status: She is alert and oriented to person, place, and time.     GCS: GCS eye subscore is 4. GCS verbal subscore is 5. GCS motor subscore is 6.     Cranial Nerves: Cranial nerves 2-12 are intact.     Sensory: Sensation is intact.     Motor: Motor function is intact.     Coordination: Coordination is intact.  Psychiatric:        Attention and Perception:  Attention normal.        Mood and Affect: Mood normal.        Speech: Speech normal.        Behavior: Behavior normal.     (all labs ordered are listed, but only abnormal results are displayed) Labs Reviewed - No data to display  EKG: None  Radiology: DG Hand Complete Left Result Date: 10/26/2024 CLINICAL  DATA:  hand pain EXAM: LEFT HAND - COMPLETE 3+ VIEW COMPARISON:  None Available. FINDINGS: No acute fracture or dislocation. There is no evidence of arthropathy or other focal bone abnormality. Soft tissues are unremarkable. No radiopaque foreign body. IMPRESSION: No acute fracture or dislocation. Electronically Signed   By: Rogelia Myers M.D.   On: 10/26/2024 22:54     Procedures   Medications Ordered in the ED  acetaminophen  (TYLENOL ) tablet 1,000 mg (has no administration in time range)  ibuprofen  (ADVIL ) tablet 800 mg (has no administration in time range)                                    Medical Decision Making Risk OTC drugs. Prescription drug management.   Presents with pain in the left hand and wrist.  Symptoms associated with repetitive motion and lifting moderate weights at work.  Examination reveals mild tenderness without swelling.  Pain is exacerbated by ulnar deviation, extension at the wrist.  Consistent with tendinitis.  X-ray negative.  Patient also reports clicking and sticking of her middle finger, prior history of trigger finger with release surgery.  Refer back to Dr. Dina for both problems.  NSAIDs, wrist brace.     Final diagnoses:  Trigger middle finger of left hand  Left wrist tendinitis  Primary hypertension    ED Discharge Orders          Ordered    ibuprofen  (ADVIL ) 600 MG tablet  Every 6 hours PRN        10/26/24 2315               Haze Lonni PARAS, MD 10/26/24 2315

## 2024-10-26 NOTE — ED Notes (Signed)
 XR at bedside
